# Patient Record
Sex: Male | Born: 1955 | Race: White | Hispanic: No | Smoking: Current every day smoker
Health system: Southern US, Community
[De-identification: ages and names within clinical notes are randomized; demographics above are authoritative.]

## PROBLEM LIST (undated history)

## (undated) ENCOUNTER — Emergency Department: Payer: Disability Insurance

## (undated) DIAGNOSIS — M545 Low back pain, unspecified: Secondary | ICD-10-CM

## (undated) DIAGNOSIS — L940 Localized scleroderma [morphea]: Secondary | ICD-10-CM

## (undated) DIAGNOSIS — F329 Major depressive disorder, single episode, unspecified: Secondary | ICD-10-CM

## (undated) DIAGNOSIS — F1910 Other psychoactive substance abuse, uncomplicated: Secondary | ICD-10-CM

## (undated) DIAGNOSIS — F32A Depression, unspecified: Secondary | ICD-10-CM

## (undated) DIAGNOSIS — B1920 Unspecified viral hepatitis C without hepatic coma: Secondary | ICD-10-CM

## (undated) DIAGNOSIS — F1010 Alcohol abuse, uncomplicated: Secondary | ICD-10-CM

## (undated) DIAGNOSIS — I2699 Other pulmonary embolism without acute cor pulmonale: Secondary | ICD-10-CM

## (undated) DIAGNOSIS — I7300 Raynaud's syndrome without gangrene: Secondary | ICD-10-CM

## (undated) DIAGNOSIS — E079 Disorder of thyroid, unspecified: Secondary | ICD-10-CM

## (undated) DIAGNOSIS — T7840XA Allergy, unspecified, initial encounter: Secondary | ICD-10-CM

## (undated) DIAGNOSIS — K743 Primary biliary cirrhosis: Secondary | ICD-10-CM

## (undated) DIAGNOSIS — I10 Essential (primary) hypertension: Secondary | ICD-10-CM

## (undated) HISTORY — PX: HERNIA REPAIR: SHX51

## (undated) HISTORY — DX: Other psychoactive substance abuse, uncomplicated: F19.10

## (undated) HISTORY — DX: Depression, unspecified: F32.A

## (undated) HISTORY — PX: KNEE SURGERY: SHX244

## (undated) HISTORY — DX: Other pulmonary embolism without acute cor pulmonale: I26.99

## (undated) HISTORY — DX: Low back pain, unspecified: M54.50

## (undated) HISTORY — DX: Alcohol abuse, uncomplicated: F10.10

## (undated) HISTORY — DX: Low back pain: M54.5

## (undated) HISTORY — DX: Major depressive disorder, single episode, unspecified: F32.9

## (undated) HISTORY — DX: Allergy, unspecified, initial encounter: T78.40XA

## (undated) HISTORY — DX: Unspecified viral hepatitis C without hepatic coma: B19.20

---

## 2002-03-12 DIAGNOSIS — G459 Transient cerebral ischemic attack, unspecified: Secondary | ICD-10-CM

## 2002-03-12 HISTORY — DX: Transient cerebral ischemic attack, unspecified: G45.9

## 2003-09-05 ENCOUNTER — Other Ambulatory Visit: Payer: Self-pay

## 2005-03-20 ENCOUNTER — Emergency Department: Payer: Self-pay | Admitting: Emergency Medicine

## 2005-06-02 ENCOUNTER — Emergency Department: Payer: Self-pay | Admitting: Unknown Physician Specialty

## 2007-01-29 ENCOUNTER — Other Ambulatory Visit: Payer: Self-pay

## 2007-01-29 ENCOUNTER — Inpatient Hospital Stay: Payer: Self-pay | Admitting: Internal Medicine

## 2007-02-17 ENCOUNTER — Emergency Department: Payer: Self-pay | Admitting: Emergency Medicine

## 2009-01-18 ENCOUNTER — Emergency Department: Payer: Self-pay | Admitting: Unknown Physician Specialty

## 2009-10-25 ENCOUNTER — Emergency Department: Payer: Self-pay | Admitting: Emergency Medicine

## 2009-11-30 ENCOUNTER — Emergency Department: Payer: Self-pay | Admitting: Unknown Physician Specialty

## 2010-08-11 ENCOUNTER — Inpatient Hospital Stay: Payer: Self-pay | Admitting: Psychiatry

## 2010-09-19 ENCOUNTER — Emergency Department: Payer: Self-pay | Admitting: Emergency Medicine

## 2010-09-21 ENCOUNTER — Ambulatory Visit: Payer: Self-pay | Admitting: Cardiology

## 2010-10-01 ENCOUNTER — Inpatient Hospital Stay: Payer: Self-pay | Admitting: Psychiatry

## 2010-10-14 ENCOUNTER — Emergency Department: Payer: Self-pay | Admitting: Emergency Medicine

## 2010-11-22 ENCOUNTER — Emergency Department: Payer: Self-pay | Admitting: Emergency Medicine

## 2011-03-09 ENCOUNTER — Emergency Department: Payer: Self-pay | Admitting: Emergency Medicine

## 2011-04-03 ENCOUNTER — Emergency Department: Payer: Self-pay | Admitting: Emergency Medicine

## 2011-04-05 ENCOUNTER — Emergency Department: Payer: Self-pay | Admitting: Emergency Medicine

## 2011-05-09 ENCOUNTER — Emergency Department: Payer: Self-pay | Admitting: Emergency Medicine

## 2011-08-05 ENCOUNTER — Emergency Department: Payer: Self-pay | Admitting: *Deleted

## 2011-09-27 ENCOUNTER — Observation Stay: Payer: Self-pay | Admitting: Internal Medicine

## 2011-09-27 LAB — CBC
HCT: 48.1 % (ref 40.0–52.0)
MCHC: 33.1 g/dL (ref 32.0–36.0)
MCV: 94 fL (ref 80–100)
Platelet: 139 10*3/uL — ABNORMAL LOW (ref 150–440)
RDW: 13.5 % (ref 11.5–14.5)
WBC: 3.5 10*3/uL — ABNORMAL LOW (ref 3.8–10.6)

## 2011-09-27 LAB — BASIC METABOLIC PANEL
Calcium, Total: 9.2 mg/dL (ref 8.5–10.1)
Chloride: 106 mmol/L (ref 98–107)
Creatinine: 1.12 mg/dL (ref 0.60–1.30)
EGFR (Non-African Amer.): >60
Glucose: 94 mg/dL (ref 65–99)
Potassium: 3.7 mmol/L (ref 3.5–5.1)
Sodium: 142 mmol/L (ref 136–145)

## 2011-09-27 LAB — TROPONIN I: Troponin-I: <0.02 ng/mL

## 2011-09-27 LAB — DRUG SCREEN, URINE
Amphetamines, Ur Screen: NEGATIVE (ref ?–1000)
Cannabinoid 50 Ng, Ur ~~LOC~~: POSITIVE (ref ?–50)
Cocaine Metabolite,Ur ~~LOC~~: POSITIVE (ref ?–300)
Opiate, Ur Screen: POSITIVE (ref ?–300)
Tricyclic, Ur Screen: NEGATIVE (ref ?–1000)

## 2011-09-27 LAB — PROTIME-INR: Prothrombin Time: 12.6 secs (ref 11.5–14.7)

## 2011-09-27 LAB — APTT: Activated PTT: 29.5 secs (ref 23.6–35.9)

## 2011-09-27 LAB — CK TOTAL AND CKMB (NOT AT ARMC)
CK, Total: 90 U/L (ref 35–232)
CK-MB: 1 ng/mL (ref 0.5–3.6)

## 2011-09-28 DIAGNOSIS — R079 Chest pain, unspecified: Secondary | ICD-10-CM

## 2011-09-28 LAB — TROPONIN I: Troponin-I: <0.02 ng/mL

## 2011-09-28 LAB — CBC WITH DIFFERENTIAL/PLATELET
Basophil %: 0.8 %
Eosinophil %: 6.1 %
Lymphocyte %: 49.2 %
Monocyte %: 11.2 %
Neutrophil #: 1.2 10*3/uL — ABNORMAL LOW (ref 1.4–6.5)
WBC: 3.7 10*3/uL — ABNORMAL LOW (ref 3.8–10.6)

## 2011-09-28 LAB — BASIC METABOLIC PANEL
Anion Gap: 5 — ABNORMAL LOW (ref 7–16)
BUN: 15 mg/dL (ref 7–18)
Calcium, Total: 7.7 mg/dL — ABNORMAL LOW (ref 8.5–10.1)
Co2: 27 mmol/L (ref 21–32)
Creatinine: 0.96 mg/dL (ref 0.60–1.30)
EGFR (African American): >60
Glucose: 101 mg/dL — ABNORMAL HIGH (ref 65–99)
Osmolality: 286 (ref 275–301)
Sodium: 143 mmol/L (ref 136–145)

## 2011-09-28 LAB — CK-MB: CK-MB: 0.5 ng/mL (ref 0.5–3.6)

## 2011-10-04 ENCOUNTER — Encounter: Payer: Self-pay | Admitting: Cardiovascular Disease

## 2011-10-04 ENCOUNTER — Encounter: Payer: Self-pay | Admitting: *Deleted

## 2011-10-12 ENCOUNTER — Encounter: Payer: Self-pay | Admitting: Cardiovascular Disease

## 2011-10-20 LAB — DRUG SCREEN, URINE
Amphetamines, Ur Screen: NEGATIVE (ref ?–1000)
Barbiturates, Ur Screen: NEGATIVE (ref ?–200)
Benzodiazepine, Ur Scrn: NEGATIVE (ref ?–200)
Methadone, Ur Screen: NEGATIVE (ref ?–300)
Tricyclic, Ur Screen: NEGATIVE (ref ?–1000)

## 2011-10-20 LAB — URINALYSIS, COMPLETE
Glucose,UR: NEGATIVE mg/dL (ref 0–75)
Hyaline Cast: 112
Ketone: NEGATIVE
Leukocyte Esterase: NEGATIVE
Ph: 5 (ref 4.5–8.0)
Protein: NEGATIVE
Specific Gravity: 1.026 (ref 1.003–1.030)

## 2011-10-20 LAB — COMPREHENSIVE METABOLIC PANEL
Albumin: 4 g/dL (ref 3.4–5.0)
Alkaline Phosphatase: 105 U/L (ref 50–136)
Bilirubin,Total: 0.7 mg/dL (ref 0.2–1.0)
Calcium, Total: 9.3 mg/dL (ref 8.5–10.1)
Co2: 26 mmol/L (ref 21–32)
Creatinine: 1.07 mg/dL (ref 0.60–1.30)
EGFR (Non-African Amer.): >60
Glucose: 123 mg/dL — ABNORMAL HIGH (ref 65–99)
SGOT(AST): 161 U/L — ABNORMAL HIGH (ref 15–37)
SGPT (ALT): 248 U/L — ABNORMAL HIGH (ref 12–78)
Sodium: 139 mmol/L (ref 136–145)

## 2011-10-20 LAB — TSH: Thyroid Stimulating Horm: 1.97 u[IU]/mL

## 2011-10-20 LAB — CBC
HCT: 48.3 % (ref 40.0–52.0)
HGB: 16.4 g/dL (ref 13.0–18.0)
MCV: 94 fL (ref 80–100)
Platelet: 139 10*3/uL — ABNORMAL LOW (ref 150–440)
RDW: 13.3 % (ref 11.5–14.5)
WBC: 4.5 10*3/uL (ref 3.8–10.6)

## 2011-10-20 LAB — ACETAMINOPHEN LEVEL: Acetaminophen: <2 ug/mL

## 2011-10-20 LAB — ETHANOL: Ethanol: <3 mg/dL

## 2011-10-21 ENCOUNTER — Inpatient Hospital Stay: Payer: Self-pay | Admitting: Psychiatry

## 2011-10-22 ENCOUNTER — Encounter: Payer: Self-pay | Admitting: Cardiovascular Disease

## 2011-10-25 ENCOUNTER — Encounter: Payer: Self-pay | Admitting: Cardiovascular Disease

## 2011-10-25 LAB — BEHAVIORAL MEDICINE 1 PANEL
Albumin: 3.4 g/dL (ref 3.4–5.0)
Alkaline Phosphatase: 75 U/L (ref 50–136)
Anion Gap: 9 (ref 7–16)
BUN: 16 mg/dL (ref 7–18)
Basophil #: 0 10*3/uL (ref 0.0–0.1)
Basophil %: 0.7 %
Calcium, Total: 8.8 mg/dL (ref 8.5–10.1)
Chloride: 103 mmol/L (ref 98–107)
Creatinine: 0.87 mg/dL (ref 0.60–1.30)
EGFR (Non-African Amer.): >60
Glucose: 92 mg/dL (ref 65–99)
HCT: 42.8 % (ref 40.0–52.0)
Lymphocyte %: 39.5 %
MCH: 31.4 pg (ref 26.0–34.0)
MCHC: 34 g/dL (ref 32.0–36.0)
Monocyte %: 11.6 %
Neutrophil #: 2.5 10*3/uL (ref 1.4–6.5)
Neutrophil %: 44.7 %
RBC: 4.64 10*6/uL (ref 4.40–5.90)
RDW: 13.4 % (ref 11.5–14.5)
SGOT(AST): 41 U/L — ABNORMAL HIGH (ref 15–37)
Sodium: 141 mmol/L (ref 136–145)
Thyroid Stimulating Horm: 1.47 u[IU]/mL
Total Protein: 7.5 g/dL (ref 6.4–8.2)
WBC: 5.6 10*3/uL (ref 3.8–10.6)

## 2012-01-01 ENCOUNTER — Emergency Department: Payer: Self-pay | Admitting: Emergency Medicine

## 2012-01-01 LAB — URINALYSIS, COMPLETE
Bilirubin,UR: NEGATIVE
Blood: NEGATIVE
Leukocyte Esterase: NEGATIVE
Nitrite: NEGATIVE
Ph: 9 (ref 4.5–8.0)
Protein: NEGATIVE
RBC,UR: NONE SEEN /HPF (ref 0–5)
Squamous Epithelial: NONE SEEN

## 2012-06-18 ENCOUNTER — Emergency Department: Payer: Self-pay | Admitting: Emergency Medicine

## 2012-06-18 LAB — DRUG SCREEN, URINE
Barbiturates, Ur Screen: NEGATIVE (ref ?–200)
Cocaine Metabolite,Ur ~~LOC~~: POSITIVE (ref ?–300)
MDMA (Ecstasy)Ur Screen: NEGATIVE (ref ?–500)
Methadone, Ur Screen: NEGATIVE (ref ?–300)
Phencyclidine (PCP) Ur S: NEGATIVE (ref ?–25)
Tricyclic, Ur Screen: NEGATIVE (ref ?–1000)

## 2012-06-18 LAB — URINALYSIS, COMPLETE
Bacteria: NONE SEEN
Blood: NEGATIVE
Protein: NEGATIVE
RBC,UR: NONE SEEN /HPF (ref 0–5)
Specific Gravity: 1.018 (ref 1.003–1.030)
WBC UR: NONE SEEN /HPF (ref 0–5)

## 2012-06-18 LAB — COMPREHENSIVE METABOLIC PANEL
Albumin: 3.7 g/dL (ref 3.4–5.0)
Alkaline Phosphatase: 87 U/L (ref 50–136)
Anion Gap: 4 — ABNORMAL LOW (ref 7–16)
Calcium, Total: 8.9 mg/dL (ref 8.5–10.1)
Chloride: 105 mmol/L (ref 98–107)
Co2: 29 mmol/L (ref 21–32)
EGFR (African American): >60
Glucose: 150 mg/dL — ABNORMAL HIGH (ref 65–99)
Osmolality: 279 (ref 275–301)
Sodium: 138 mmol/L (ref 136–145)
Total Protein: 8.2 g/dL (ref 6.4–8.2)

## 2012-06-18 LAB — TROPONIN I: Troponin-I: <0.02 ng/mL

## 2012-06-18 LAB — CBC
HCT: 43 % (ref 40.0–52.0)
HGB: 14.8 g/dL (ref 13.0–18.0)
MCHC: 34.4 g/dL (ref 32.0–36.0)
WBC: 5.8 10*3/uL (ref 3.8–10.6)

## 2012-06-18 LAB — ETHANOL: Ethanol: <3 mg/dL

## 2012-08-19 ENCOUNTER — Emergency Department: Payer: Self-pay | Admitting: Unknown Physician Specialty

## 2012-09-25 DIAGNOSIS — F32A Depression, unspecified: Secondary | ICD-10-CM | POA: Insufficient documentation

## 2012-09-25 DIAGNOSIS — J449 Chronic obstructive pulmonary disease, unspecified: Secondary | ICD-10-CM | POA: Insufficient documentation

## 2012-09-27 DIAGNOSIS — N410 Acute prostatitis: Secondary | ICD-10-CM | POA: Insufficient documentation

## 2012-09-27 DIAGNOSIS — F10939 Alcohol use, unspecified with withdrawal, unspecified: Secondary | ICD-10-CM | POA: Insufficient documentation

## 2012-09-28 DIAGNOSIS — F1410 Cocaine abuse, uncomplicated: Secondary | ICD-10-CM | POA: Insufficient documentation

## 2013-03-12 ENCOUNTER — Emergency Department: Payer: Self-pay | Admitting: Emergency Medicine

## 2013-03-13 ENCOUNTER — Emergency Department: Payer: Self-pay | Admitting: Emergency Medicine

## 2013-03-13 LAB — BASIC METABOLIC PANEL
ANION GAP: 4 — AB (ref 7–16)
BUN: 11 mg/dL (ref 7–18)
CALCIUM: 9.5 mg/dL (ref 8.5–10.1)
CHLORIDE: 103 mmol/L (ref 98–107)
CO2: 29 mmol/L (ref 21–32)
CREATININE: 0.93 mg/dL (ref 0.60–1.30)
EGFR (African American): >60
Glucose: 156 mg/dL — ABNORMAL HIGH (ref 65–99)
Osmolality: 275 (ref 275–301)
POTASSIUM: 3.9 mmol/L (ref 3.5–5.1)
SODIUM: 136 mmol/L (ref 136–145)

## 2013-03-13 LAB — CBC
HCT: 41.8 % (ref 40.0–52.0)
HGB: 14.3 g/dL (ref 13.0–18.0)
MCH: 31.7 pg (ref 26.0–34.0)
MCHC: 34.3 g/dL (ref 32.0–36.0)
MCV: 93 fL (ref 80–100)
Platelet: 124 10*3/uL — ABNORMAL LOW (ref 150–440)
RBC: 4.52 10*6/uL (ref 4.40–5.90)
RDW: 14.3 % (ref 11.5–14.5)
WBC: 3.8 10*3/uL (ref 3.8–10.6)

## 2013-04-21 LAB — COMPREHENSIVE METABOLIC PANEL
AST: 181 U/L — AB (ref 15–37)
Albumin: 3.8 g/dL (ref 3.4–5.0)
Alkaline Phosphatase: 77 U/L
Anion Gap: 2 — ABNORMAL LOW (ref 7–16)
BUN: 10 mg/dL (ref 7–18)
Bilirubin,Total: 0.4 mg/dL (ref 0.2–1.0)
CHLORIDE: 106 mmol/L (ref 98–107)
Calcium, Total: 8.9 mg/dL (ref 8.5–10.1)
Co2: 29 mmol/L (ref 21–32)
Creatinine: 0.88 mg/dL (ref 0.60–1.30)
EGFR (African American): >60
EGFR (Non-African Amer.): >60
GLUCOSE: 93 mg/dL (ref 65–99)
Osmolality: 273 (ref 275–301)
Potassium: 3.9 mmol/L (ref 3.5–5.1)
SGPT (ALT): 344 U/L — ABNORMAL HIGH (ref 12–78)
Sodium: 137 mmol/L (ref 136–145)
Total Protein: 8.1 g/dL (ref 6.4–8.2)

## 2013-04-21 LAB — URINALYSIS, COMPLETE
BACTERIA: NONE SEEN
BILIRUBIN, UR: NEGATIVE
Blood: NEGATIVE
Glucose,UR: NEGATIVE mg/dL (ref 0–75)
KETONE: NEGATIVE
LEUKOCYTE ESTERASE: NEGATIVE
NITRITE: NEGATIVE
PH: 5 (ref 4.5–8.0)
Protein: NEGATIVE
RBC,UR: 1 /HPF (ref 0–5)
SPECIFIC GRAVITY: 1.015 (ref 1.003–1.030)
SQUAMOUS EPITHELIAL: NONE SEEN

## 2013-04-21 LAB — CBC
HCT: 44.6 % (ref 40.0–52.0)
HGB: 15.1 g/dL (ref 13.0–18.0)
MCH: 32.1 pg (ref 26.0–34.0)
MCHC: 33.8 g/dL (ref 32.0–36.0)
MCV: 95 fL (ref 80–100)
Platelet: 85 10*3/uL — ABNORMAL LOW (ref 150–440)
RBC: 4.71 10*6/uL (ref 4.40–5.90)
RDW: 13.6 % (ref 11.5–14.5)
WBC: 4.6 10*3/uL (ref 3.8–10.6)

## 2013-04-21 LAB — ETHANOL
Ethanol %: <0.003 % (ref 0.000–0.080)
Ethanol: <3 mg/dL

## 2013-04-21 LAB — DRUG SCREEN, URINE
AMPHETAMINES, UR SCREEN: NEGATIVE (ref ?–1000)
Barbiturates, Ur Screen: NEGATIVE (ref ?–200)
Benzodiazepine, Ur Scrn: POSITIVE (ref ?–200)
COCAINE METABOLITE, UR ~~LOC~~: POSITIVE (ref ?–300)
Cannabinoid 50 Ng, Ur ~~LOC~~: NEGATIVE (ref ?–50)
MDMA (Ecstasy)Ur Screen: NEGATIVE (ref ?–500)
Methadone, Ur Screen: NEGATIVE (ref ?–300)
OPIATE, UR SCREEN: NEGATIVE (ref ?–300)
Phencyclidine (PCP) Ur S: NEGATIVE (ref ?–25)
Tricyclic, Ur Screen: NEGATIVE (ref ?–1000)

## 2013-04-21 LAB — SALICYLATE LEVEL: Salicylates, Serum: <1.7 mg/dL

## 2013-04-21 LAB — ACETAMINOPHEN LEVEL: Acetaminophen: <2 ug/mL

## 2013-04-22 ENCOUNTER — Inpatient Hospital Stay: Payer: Self-pay | Admitting: Psychiatry

## 2013-04-25 LAB — HEPATIC FUNCTION PANEL A (ARMC)
AST: 124 U/L — AB (ref 15–37)
Albumin: 3.1 g/dL — ABNORMAL LOW (ref 3.4–5.0)
Alkaline Phosphatase: 60 U/L
Bilirubin, Direct: 0.1 mg/dL (ref 0.00–0.20)
Bilirubin,Total: 0.2 mg/dL (ref 0.2–1.0)
SGPT (ALT): 234 U/L — ABNORMAL HIGH (ref 12–78)
Total Protein: 7 g/dL (ref 6.4–8.2)

## 2013-04-26 LAB — AMMONIA: AMMONIA, PLASMA: 35 umol/L — AB (ref 11–32)

## 2013-04-26 LAB — MAGNESIUM: Magnesium: 2.1 mg/dL

## 2013-05-12 ENCOUNTER — Emergency Department: Payer: Self-pay | Admitting: Emergency Medicine

## 2013-05-12 LAB — CBC
HCT: 37.4 % — ABNORMAL LOW (ref 40.0–52.0)
HGB: 12.3 g/dL — ABNORMAL LOW (ref 13.0–18.0)
MCH: 30.9 pg (ref 26.0–34.0)
MCHC: 32.8 g/dL (ref 32.0–36.0)
MCV: 94 fL (ref 80–100)
PLATELETS: 152 10*3/uL (ref 150–440)
RBC: 3.96 10*6/uL — ABNORMAL LOW (ref 4.40–5.90)
RDW: 13.3 % (ref 11.5–14.5)
WBC: 3.4 10*3/uL — ABNORMAL LOW (ref 3.8–10.6)

## 2013-05-12 LAB — COMPREHENSIVE METABOLIC PANEL
ALBUMIN: 3 g/dL — AB (ref 3.4–5.0)
ALT: 132 U/L — AB (ref 12–78)
AST: 129 U/L — AB (ref 15–37)
Alkaline Phosphatase: 55 U/L
Anion Gap: 2 — ABNORMAL LOW (ref 7–16)
BILIRUBIN TOTAL: 0.3 mg/dL (ref 0.2–1.0)
BUN: 14 mg/dL (ref 7–18)
CALCIUM: 8.4 mg/dL — AB (ref 8.5–10.1)
Chloride: 108 mmol/L — ABNORMAL HIGH (ref 98–107)
Co2: 31 mmol/L (ref 21–32)
Creatinine: 0.87 mg/dL (ref 0.60–1.30)
EGFR (African American): >60
EGFR (Non-African Amer.): >60
GLUCOSE: 84 mg/dL (ref 65–99)
OSMOLALITY: 281 (ref 275–301)
POTASSIUM: 4.2 mmol/L (ref 3.5–5.1)
SODIUM: 141 mmol/L (ref 136–145)
TOTAL PROTEIN: 7.3 g/dL (ref 6.4–8.2)

## 2013-08-05 IMAGING — CR DG SHOULDER 3+V*R*
1 series · 3 of 3 positions shown · non-contrast
Comparison: none

REASON FOR EXAM: pain/decrease ROM 2nd to lifting incident
COMMENTS:   LMP: (Male)

[Series 1: w shoulder external right · 0.14mm/px · 3 of 3 slices shown]
[im 1/3]
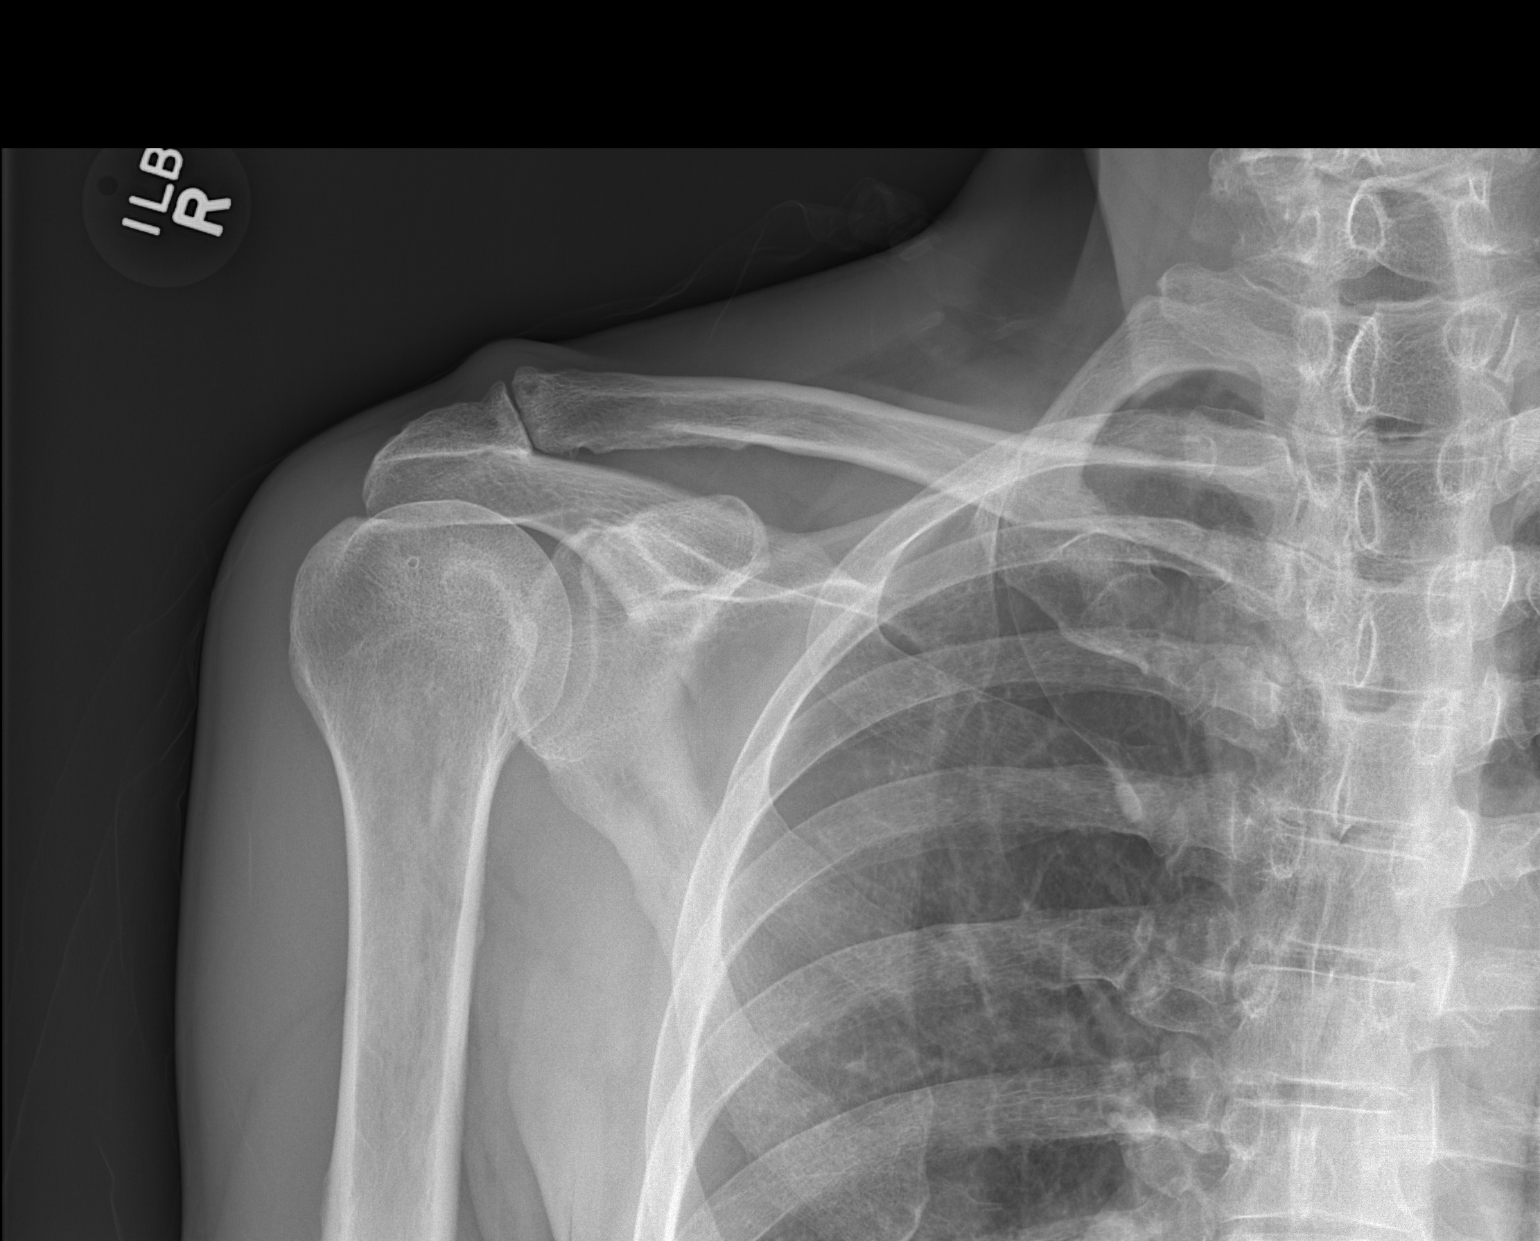
[im 2/3]
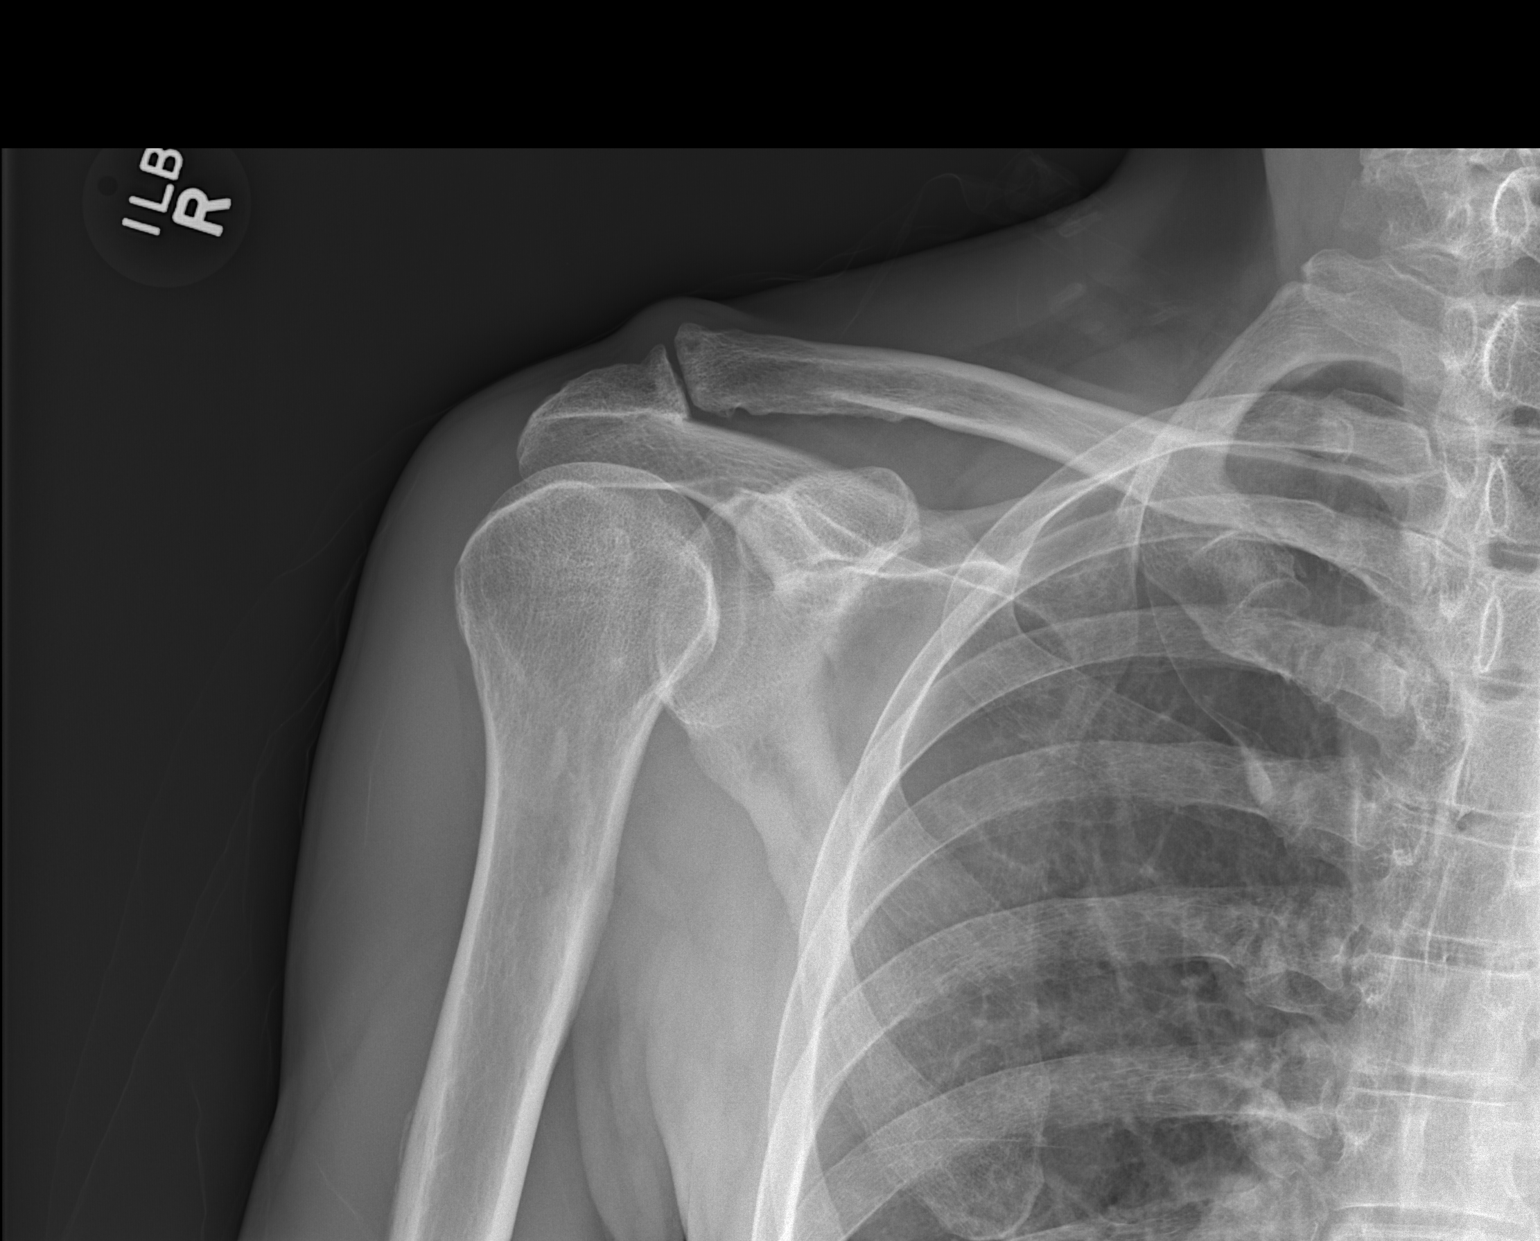
[im 3/3]
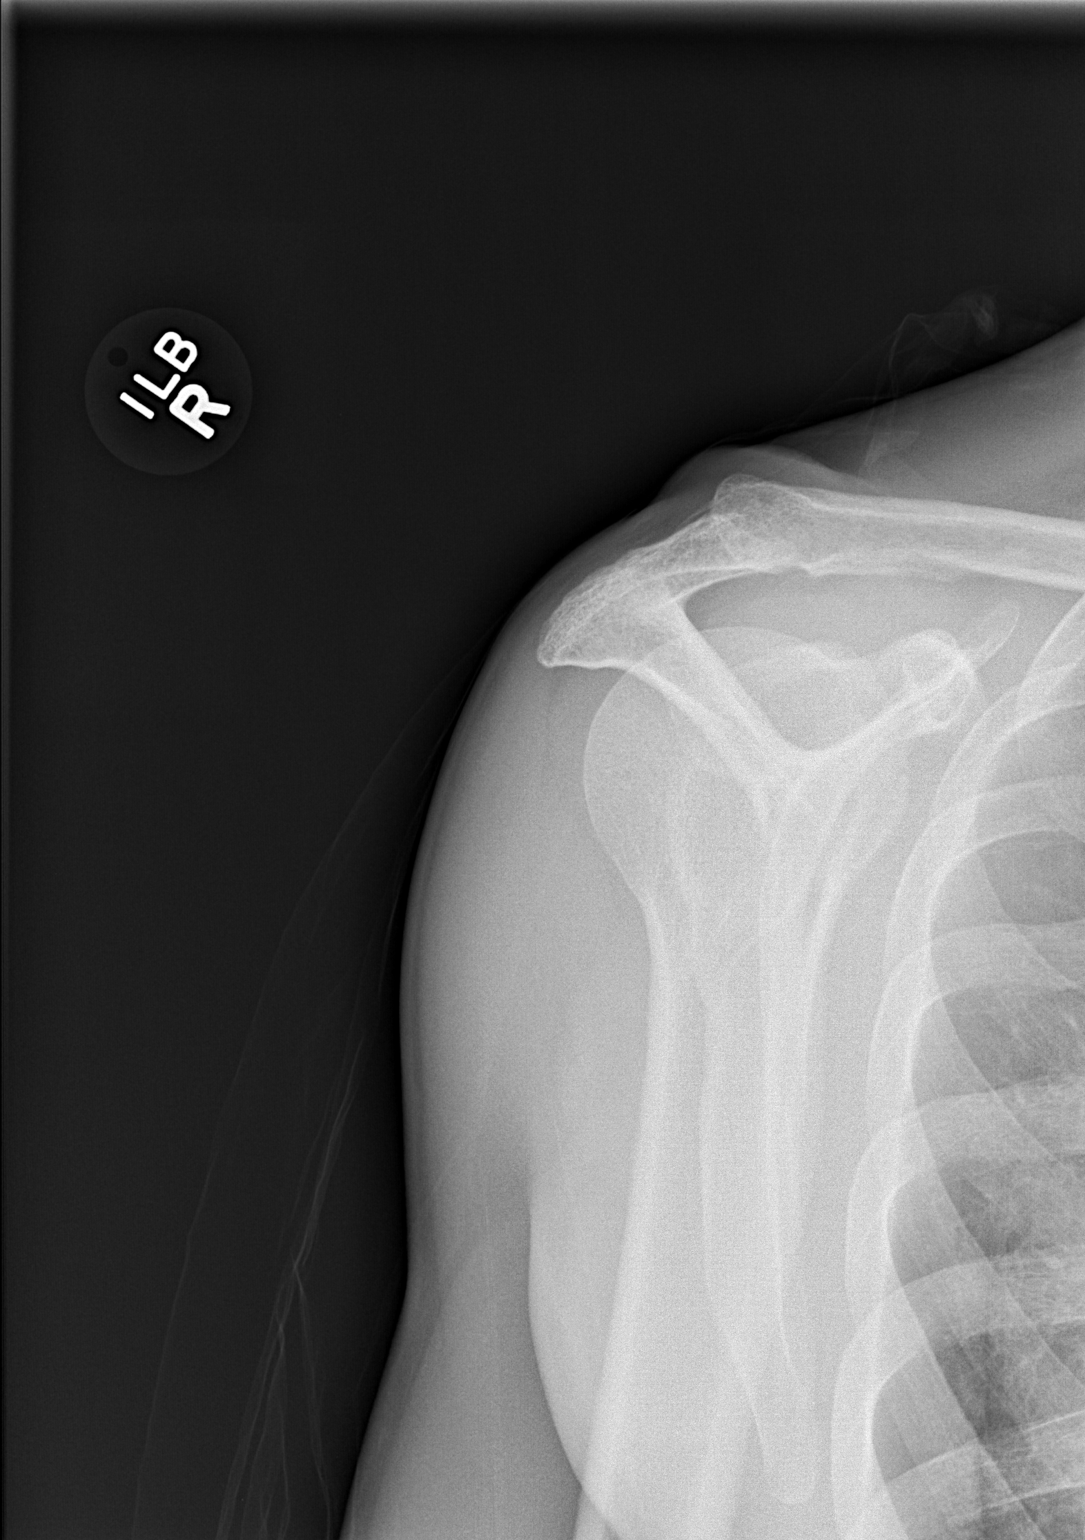

[3 of 3 positions shown; findings below may reference images not displayed]

PROCEDURE:     DXR - DXR SHOULDER RIGHT COMPLETE  - August 19, 2012 [DATE]

RESULT:     Three views of the right shoulder reveal the bones to be
reasonably well mineralized. The glenohumeral joint is normal in appearance.
There are degenerative changes of the AC joint. The observed portions of the
clavicle and upper right ribs appear normal.
IMPRESSION: There is no acute bony abnormality of the right shoulder.
There are degenerative changes of the AC joint. If the patient's symptoms
persist, followup MRI may be useful.

[REDACTED]

## 2013-09-13 ENCOUNTER — Emergency Department: Payer: Self-pay | Admitting: Emergency Medicine

## 2013-09-13 LAB — CBC
HCT: 43 % (ref 40.0–52.0)
HGB: 14.2 g/dL (ref 13.0–18.0)
MCH: 30.9 pg (ref 26.0–34.0)
MCHC: 33 g/dL (ref 32.0–36.0)
MCV: 94 fL (ref 80–100)
Platelet: 124 10*3/uL — ABNORMAL LOW (ref 150–440)
RBC: 4.6 10*6/uL (ref 4.40–5.90)
RDW: 13.9 % (ref 11.5–14.5)
WBC: 5.3 10*3/uL (ref 3.8–10.6)

## 2013-09-13 LAB — URINALYSIS, COMPLETE
BLOOD: NEGATIVE
Bacteria: NEGATIVE
Bilirubin,UR: NEGATIVE
GLUCOSE, UR: NEGATIVE mg/dL (ref 0–75)
Ketone: NEGATIVE
LEUKOCYTE ESTERASE: NEGATIVE
NITRITE: NEGATIVE
Ph: 5 (ref 4.5–8.0)
Protein: NEGATIVE
RBC, UR: NONE SEEN /HPF (ref 0–5)
Specific Gravity: 1.01 (ref 1.003–1.030)

## 2013-09-13 LAB — SALICYLATE LEVEL: SALICYLATES, SERUM: 2.6 mg/dL

## 2013-09-13 LAB — DRUG SCREEN, URINE
AMPHETAMINES, UR SCREEN: NEGATIVE (ref ?–1000)
BARBITURATES, UR SCREEN: NEGATIVE (ref ?–200)
BENZODIAZEPINE, UR SCRN: NEGATIVE (ref ?–200)
CANNABINOID 50 NG, UR ~~LOC~~: NEGATIVE (ref ?–50)
Cocaine Metabolite,Ur ~~LOC~~: POSITIVE (ref ?–300)
MDMA (Ecstasy)Ur Screen: NEGATIVE (ref ?–500)
Methadone, Ur Screen: NEGATIVE (ref ?–300)
Opiate, Ur Screen: NEGATIVE (ref ?–300)
PHENCYCLIDINE (PCP) UR S: NEGATIVE (ref ?–25)
Tricyclic, Ur Screen: NEGATIVE (ref ?–1000)

## 2013-09-13 LAB — COMPREHENSIVE METABOLIC PANEL
ALK PHOS: 83 U/L
Albumin: 3.4 g/dL (ref 3.4–5.0)
Anion Gap: 7 (ref 7–16)
BILIRUBIN TOTAL: 0.4 mg/dL (ref 0.2–1.0)
BUN: 9 mg/dL (ref 7–18)
Calcium, Total: 8.7 mg/dL (ref 8.5–10.1)
Chloride: 105 mmol/L (ref 98–107)
Co2: 27 mmol/L (ref 21–32)
Creatinine: 0.9 mg/dL (ref 0.60–1.30)
EGFR (Non-African Amer.): >60
Glucose: 93 mg/dL (ref 65–99)
Osmolality: 276 (ref 275–301)
Potassium: 3.8 mmol/L (ref 3.5–5.1)
SGOT(AST): 216 U/L — ABNORMAL HIGH (ref 15–37)
SGPT (ALT): 250 U/L — ABNORMAL HIGH (ref 12–78)
SODIUM: 139 mmol/L (ref 136–145)
Total Protein: 8.4 g/dL — ABNORMAL HIGH (ref 6.4–8.2)

## 2013-09-13 LAB — ACETAMINOPHEN LEVEL

## 2013-09-13 LAB — ETHANOL
ETHANOL %: 0.168 % — AB (ref 0.000–0.080)
Ethanol: 168 mg/dL

## 2013-09-13 LAB — TROPONIN I: Troponin-I: <0.02 ng/mL

## 2013-09-13 LAB — CK: CK, Total: 203 U/L

## 2013-09-14 LAB — TROPONIN I: Troponin-I: <0.02 ng/mL

## 2013-10-23 ENCOUNTER — Emergency Department: Payer: Self-pay | Admitting: Emergency Medicine

## 2013-12-14 ENCOUNTER — Ambulatory Visit: Payer: Self-pay | Admitting: Nurse Practitioner

## 2013-12-31 ENCOUNTER — Emergency Department: Payer: Self-pay | Admitting: Emergency Medicine

## 2014-02-18 ENCOUNTER — Ambulatory Visit: Payer: Self-pay | Admitting: Nurse Practitioner

## 2014-02-26 ENCOUNTER — Ambulatory Visit: Payer: Self-pay | Admitting: Specialist

## 2014-04-04 ENCOUNTER — Emergency Department: Payer: Self-pay | Admitting: Student

## 2014-04-04 LAB — COMPREHENSIVE METABOLIC PANEL
ALBUMIN: 4 g/dL (ref 3.4–5.0)
ALT: 322 U/L — AB
AST: 228 U/L — AB (ref 15–37)
Alkaline Phosphatase: 79 U/L
Anion Gap: 9 (ref 7–16)
BUN: 14 mg/dL (ref 7–18)
Bilirubin,Total: 0.4 mg/dL (ref 0.2–1.0)
Calcium, Total: 8.7 mg/dL (ref 8.5–10.1)
Chloride: 105 mmol/L (ref 98–107)
Co2: 24 mmol/L (ref 21–32)
Creatinine: 0.97 mg/dL (ref 0.60–1.30)
EGFR (African American): >60
EGFR (Non-African Amer.): >60
GLUCOSE: 109 mg/dL — AB (ref 65–99)
Osmolality: 277 (ref 275–301)
POTASSIUM: 3.8 mmol/L (ref 3.5–5.1)
Sodium: 138 mmol/L (ref 136–145)
TOTAL PROTEIN: 8.1 g/dL (ref 6.4–8.2)

## 2014-04-04 LAB — DRUG SCREEN, URINE
Amphetamines, Ur Screen: NEGATIVE (ref ?–1000)
Barbiturates, Ur Screen: NEGATIVE (ref ?–200)
Benzodiazepine, Ur Scrn: NEGATIVE (ref ?–200)
COCAINE METABOLITE, UR ~~LOC~~: POSITIVE (ref ?–300)
Cannabinoid 50 Ng, Ur ~~LOC~~: NEGATIVE (ref ?–50)
MDMA (ECSTASY) UR SCREEN: NEGATIVE (ref ?–500)
Methadone, Ur Screen: NEGATIVE (ref ?–300)
Opiate, Ur Screen: NEGATIVE (ref ?–300)
PHENCYCLIDINE (PCP) UR S: NEGATIVE (ref ?–25)
Tricyclic, Ur Screen: NEGATIVE (ref ?–1000)

## 2014-04-04 LAB — CBC
HCT: 41.9 % (ref 40.0–52.0)
HGB: 13.7 g/dL (ref 13.0–18.0)
MCH: 30.5 pg (ref 26.0–34.0)
MCHC: 32.6 g/dL (ref 32.0–36.0)
MCV: 94 fL (ref 80–100)
PLATELETS: 119 10*3/uL — AB (ref 150–440)
RBC: 4.48 10*6/uL (ref 4.40–5.90)
RDW: 15 % — ABNORMAL HIGH (ref 11.5–14.5)
WBC: 5.3 10*3/uL (ref 3.8–10.6)

## 2014-04-04 LAB — URINALYSIS, COMPLETE
BACTERIA: NONE SEEN
Bilirubin,UR: NEGATIVE
Blood: NEGATIVE
GLUCOSE, UR: NEGATIVE mg/dL (ref 0–75)
KETONE: NEGATIVE
LEUKOCYTE ESTERASE: NEGATIVE
Nitrite: NEGATIVE
Ph: 5 (ref 4.5–8.0)
Protein: NEGATIVE
RBC, UR: NONE SEEN /HPF (ref 0–5)
SQUAMOUS EPITHELIAL: NONE SEEN
Specific Gravity: 1.003 (ref 1.003–1.030)
WBC UR: NONE SEEN /HPF (ref 0–5)

## 2014-04-04 LAB — SALICYLATE LEVEL: SALICYLATES, SERUM: 4.8 mg/dL — AB

## 2014-04-04 LAB — ETHANOL: Ethanol: 212 mg/dL

## 2014-04-04 LAB — ACETAMINOPHEN LEVEL: Acetaminophen: <2 ug/mL

## 2014-04-05 LAB — ETHANOL: Ethanol: <3 mg/dL
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
## 2014-06-29 NOTE — H&P (Signed)
PATIENT NAME:  Juan Wood, Juan Wood MR#:  [id number] DATE OF BIRTH:  1956/03/01  DATE OF ADMISSION:  09/27/2011  PRIMARY CARE PHYSICIAN: None. REQUESTING PHYSICIAN: Daryel NovemberJonathan Williams, MD   CHIEF COMPLAINT: Chest pain.   HISTORY OF PRESENT ILLNESS: The patient is a 59 year old male with a known history of alcohol and drug abuse, depression, hepatitis C, is being admitted for chest pain. The patient was working out in the hot day yesterday when he started having chest pain. He was working outside and started having associated shortness of breath, was feeling very weak, nauseated, and  decided to come to the Emergency Department. His pain continued all night, was radiating to his left side of the arm and center of the chest. Considering his positive family history of heart disease, he was concerned. He also has an extensive smoking history, and he is being admitted for further evaluation and management.   PAST MEDICAL HISTORY:  1. Depression.  2. Hepatitis C.  3. Alcohol and drug abuse.   ALLERGIES: No known drug allergies.   SOCIAL HISTORY: He smokes 1 pack of cigarettes daily for the last 35 years. Occasional alcohol. Denies any illicit drugs. The patient has also had a history of legal problems and lack of primary support along with lack of access to health care resources due to lack of insurance.   FAMILY HISTORY: Positive for coronary artery disease. Father had a heart attack at the age of 59. Father also had a stroke at the age of 59.   MEDICATIONS AT HOME: None.   REVIEW OF SYSTEMS: CONSTITUTIONAL: No fever. Positive fatigue and weakness. EYES: No blurred or double vision. ENT: No tinnitus or ear pain. RESPIRATORY: No cough, wheezing or hemoptysis. CARDIOVASCULAR: Positive for chest pain, no orthopnea or edema.  GASTROINTESTINAL: No nausea, vomiting, diarrhea. GENITOURINARY: No dysuria or hematuria. ENDOCRINE: No polyuria or nocturia.  HEMATOLOGIC: No anemia or easy bruising. SKIN: No rash or  lesion. MUSCULOSKELETAL: No arthritis or muscle cramp. NEUROLOGICAL: No tingling, numbness. Positive for weakness and dizziness. SKIN: No rash or lesion, or ulcer. PSYCHIATRIC: Positive for major depression and polysubstance abuse.  Severe occupational problems along with housing problem and lack of compliance with substance abuse treatment.    PAST SURGICAL HISTORY:  1. Hernia repair.  2. Right knee surgery.   PHYSICAL EXAMINATION:  VITAL SIGNS: Temperature 98, heart rate 58 per minute, earlier it was 48 per minute, respirations 18 per minute, blood pressure 117/71 mmHg. He is saturating 99% on room air.   GENERAL: The patient is a 59 year old male lying in the bed comfortably without any acute distress.   HEENT: Eyes: Pupils are equal, round, reactive to light and accommodation. No scleral icterus. Extraocular muscles are intact. HENT: Head atraumatic, normocephalic. Oropharynx and nasopharynx clear.   NECK: Supple. No jugular venous distention. No thyroid enlargement or tenderness.   LUNGS: Clear to auscultation bilaterally. No wheezing, rales, rhonchi or crepitation.   CARDIOVASCULAR: S1 and S2 normal. No murmurs, rubs or gallop.   LUNGS: Clear to auscultation bilaterally. No wheezing, rales, rhonchi, or crepitation.   ABDOMEN: Soft, nondistended, nontender. Bowel sounds are present. No organomegaly or masses.   EXTREMITIES: No pedal edema, cyanosis or clubbing.   NEUROLOGIC: Nonfocal examination. Cranial nerves II through XII are intact. Muscle strength 5/5. Extremity sensation intact.   PSYCHIATRIC: The patient is alert and oriented to time, place and person x3.    SKIN: No obvious rash, lesion, or ulcer.   LABORATORY, DIAGNOSTIC AND RADIOLOGICAL DATA: Normal  BMP. Normal first set of cardiac enzymes. Normal CBC except platelet count of 139. Normal coagulation panel. Chest x-ray in the Emergency Room showed no acute cardiopulmonary disease. He did have findings consistent with  chronic obstructive pulmonary disease. EKG was unremarkable. No acute ST-T changes.   IMPRESSION AND PLAN:  1. Chest pain, likely noncardiac: It is atypical in nature. We will obtain serial cardiac enzymes, obtain Myoview in the morning; and if negative, the patient should be able to go home tomorrow. We will hold metoprolol or any beta blocker due to significant bradycardia and monitor him on off-unit telemetry.  2. Depression: He was followed by Dr. Maryruth Bun in the past. He does not have any insurance. We will hold off consulting Psychiatry. His mood seems stable.  3. Sinus Bradycardia: We will monitor his heart rate while on off-unit telemetry and hold off any beta blocker or rate-controlling medication.  4. Thrombocytopenia, likely chronic from ongoing alcohol abuse: He has had a platelet count as low as 121 back in June of 2012. We will closely monitor this. This is due to alcoholic liver disease.  5. Tobacco abuse: The patient was counseled about three minutes. He denies any need of nicotine patch while in the hospital.   TIME TAKEN: Total time taking care of this patient is 45 minutes.    ____________________________ Ellamae Sia. Sherryll Burger, MD vss:cbb D: 09/27/2011 14:58:10 ET T: 09/27/2011 15:23:09 ET JOB#: [id number]  cc: Grenda Lora S. Sherryll Burger, MD, <Dictator> [facility] Ellamae Sia [facility] MD ELECTRONICALLY SIGNED 09/27/2011 22:48

## 2014-06-29 NOTE — Discharge Summary (Signed)
PATIENT NAME:  Juan Wood, Juan Wood MR#:  [id number] DATE OF BIRTH:  06/29/1955  DATE OF ADMISSION:  09/27/2011 DATE OF DISCHARGE:  09/28/2011  DISCHARGE DIAGNOSES:  1. Chest pain, atypical in nature, with negative serial cardiac enzymes and negative Myoview, could be due to cocaine. 2. Polysubstance abuse with urine drug screen positive for cocaine.  3. Sinus bradycardia, asymptomatic. No further work-up as per cardiology.   SECONDARY DIAGNOSES:  1. Depression.  2. Hepatitis C. 3. Alcohol and drug abuse.   CONSULTATIONS: None.   PROCEDURE/RADIOLOGY: Myoview on 07/19 was negative. Ejection fraction of 58%. Chest x-ray on 07/18 showed findings consistent with chronic obstructive pulmonary disease. No acute cardiopulmonary disease.   HISTORY AND SHORT HOSPITAL COURSE: The patient is a 59 year old male with above-mentioned medical problems who was admitted for chest pain. Please see Dr. Margaretmary EddyShah's dictated history and physical for further details. He was ruled out with three negative sets of cardiac enzymes. Urine drug screen was positive for cocaine, cannabinoids and opiates. He underwent Myoview which was negative and was discharged home in stable condition. His chest pain was thought to be noncardiac and/or possibly due to cocaine.   PERTINENT PHYSICAL EXAMINATION ON THE DATE OF DISCHARGE: VITAL SIGNS: On the date of discharge, his vital signs are as follows: Temperature 98.4, heart rate 59 per minute, respirations 16 per minute, blood pressure 120/67. He was saturating 97% on room air. CARDIOVASCULAR: S1, S2 normal. No murmurs, rubs, or gallop. LUNGS: Clear to auscultation bilaterally. No wheezing, rales, rhonchi, or crepitation. ABDOMEN: Soft and benign. NEUROLOGIC: Nonfocal examination. All other physical examination remained at the baseline.   DISCHARGE MEDICATIONS: None.   DISCHARGE DIET: Low sodium, low fat, low cholesterol.   DISCHARGE ACTIVITY: As tolerated.   DISCHARGE INSTRUCTIONS AND  FOLLOW-UP:  1. The patient was instructed to follow-up with new primary care physician at [facility] in 1 to 2 weeks.  2. He will need follow-up with Dr. Mariah MillingGollan from [facility] in 2 to 4 weeks.  3. He was instructed to stop using drugs of abuse.   TOTAL TIME DISCHARGING THIS PATIENT: 45 minutes.   ____________________________ Ellamae SiaVipul S. Sherryll BurgerShah, MD vss:ap D: 10/01/2011 00:07:59 ET T: 10/01/2011 14:31:56 ET JOB#: [id number]  cc: Zalma Channing S. Sherryll BurgerShah, MD, <Dictator> [facility] Antonieta Ibaimothy J. Gollan, MD Ellamae SiaVIPUL S [facility] MD ELECTRONICALLY SIGNED 10/01/2011 22:01
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
## 2014-07-03 NOTE — Consult Note (Signed)
PATIENT NAME:  Juan Wood, Cline F MR#:  [id number] DATE OF BIRTH:  Oct 16, 1955  DATE OF CONSULTATION:  04/21/2013  CONSULTING PHYSICIAN:  Mackayla Mullins S. Garnetta BuddyFaheem, MD  REQUESTED BY:  Dr. Governor Rooksebecca Lord  REASON FOR CONSULT:  "I am here for depression, and alcohol and drug use. I need detox. I feel so depressed that I do not care if a car runs over me."   HISTORY OF PRESENT ILLNESS:  The patient is a 59 year old married male  who presented to the ED stating that he is feeling very depressed. He stated that he wants to hurt himself and has been feeling depressed. He just came out of jail after 20 days, and his wife asked him why he was incarcerated. He stated that he cannot take it anymore. He reported that his mother was in the hospital, and she just came out recently. The patient reported that after his release from the jail, he started drinking beer and was consuming 6 to 12 cans of beer on a daily basis. He stated that he also started using cocaine on a daily basis. 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 Reported that he is currently having relationship problems with his wife, as she left him, and now he does not know where she is. The patient reported that he was having suicidal ideations, and he was thinking about his wife all night long. The patient stated that he continues to have suicidal thoughts, and he is unable to contract for safety at this time. He was incarcerated for simple possession of marijuana, and he spent 20 days in prison. The patient reported that he does not want to know about the other details of the problems he is currently having. He stated that he feels very depressed, hopeless, helpless, and has been having thoughts to hurt himself, and is unable to contract for safety at this time.   PSYCHIATRIC HISTORY: The patient has history of depression in the past. He stated that he was taking medications, including Prozac and Klonopin, but he is not getting  any of them at this time. He continued to have suicidal thoughts.   SUBSTANCE ABUSE HISTORY: The patient reported that he started drinking at the age of 59. His last drink was 2 days ago, when he consumed approximately a 12-pack. He stated that he does not have any history of seizures or blackouts. He reported that he went to the rehab program at least 3 times, twice at [facility] and [facility]. He was unable to maintain any period of sobriety.   PAST MEDICAL HISTORY: The patient reported history of hypertension, chronic back pain, emphysema, hepatitis C, right knee surgery, hernia repair.   ALLERGIES: No known drug allergies.   CURRENT MEDICATIONS: None.   SOCIAL HISTORY: The patient is currently separated from his wife. He stated that he does not know the whereabouts of his wife at this time.   ANCILLARY DATA:  Temperature 97.8, pulse 68, respirations 18, blood pressure 137/78.   LABORATORY DATA: Glucose 93, BUN 10, creatinine 0.88, sodium 137, potassium 3.9, chloride 106, bicarbonate 29, anion gap 2, osmolality 273, calcium 8.9. Blood alcohol level less than 3. Protein 8.1, albumin 3.8, bilirubin 0.4, alkaline phosphatase 77,  AST 181, ALT 344. UDS positive for benzodiazepines and cocaine. WBC 4.6, RBC 4.71, MCV 95, RDW 13.6.   REVIEW OF SYSTEMS:    CONSTITUTIONAL: The patient has lost a significant amount of weight.  EYES: No  double or blurred vision.  CHEST:  No dyspnea or orthopnea.  GASTROINTESTINAL: No abdominal pain, nausea, vomiting or diarrhea. GENITOURINARY;  No incontinence or hematuria.   INTEGUMENTARY: No rash. Other symptoms are negative.   MENTAL STATUS EXAMINATION: The patient is a thinly built male who was lying in the bed. He maintained fair eye contact. His speech was low in tone and volume. Mood was depressed and anxious. Affect was congruent. Thought process was logical, goal-directed. Thought content was non-delusional. He appeared depressed and dysphoric. He  continued to express suicidal ideation with a plan to hurt himself. He denied having any homicidal ideations. At this time his language was appropriate. Fund of knowledge was fine. Memory appeared intact at this time. He denied having any perceptual disturbances. Unable to contract for safety at this time.   DIAGNOSTIC IMPRESSION: AXIS I: 1.  Alcohol dependence.  2.  Major depressive disorder, recurrent, severe, without psychotic features.  3.  Cocaine abuse.  AXIS II: None.  AXIS III: Please review the medical history.   TREATMENT PLAN: 1.  I will initiate involuntary commitment on this patient as he is unable to contract for safety at this time.  2.  I will start him on Librium 25 mg p.o. q.6 hours to help with the withdrawal symptoms from alcohol. He will also continue on thiamine 100 mg p.o. daily. The patient will be admitted to the inpatient behavioral health unit once a bed becomes available. He was advised about [facility] but he is not interested in going over there at this time.   Thank you for allowing me to participate in the care of this patient.    ____________________________ Ardeen Fillers. Garnetta Buddy, MD usf:mr D: 04/21/2013 19:41:48 ET T: 04/21/2013 19:57:06 ET JOB#: [id number]  cc: Ardeen Fillers. Garnetta Buddy, MD, <Dictator> Rhunette Croft MD ELECTRONICALLY SIGNED 04/27/2013 9:10

## 2014-07-03 NOTE — Consult Note (Signed)
Brief Consult Note: Diagnosis: Alcohol dependence, cocaine dependence, mood disorder NOS.   Patient was seen by consultant.   Consult note dictated.   Recommend further assessment or treatment.   Orders entered.   Comments: Juan Wood has a h/o alcoholism and mood instability worse since his m,other passed away in April 2015.  PLAN: 1. The patient is on IVC. We referred him to [facility] rehab facility.  2. Will continue CIWA protocol.  3. Will start prozac for depression and trazodone for sleep.  Electronic Signatures: [name], [name] (MD)  (Signed 06-Jul-15 18:11)  Authored: Brief Consult Note   Last Updated: 06-Jul-15 18:11 by [name], [name] (MD)
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
## 2014-07-03 NOTE — Discharge Summary (Signed)
PATIENT NAME:  Juan Wood, Auther F MR#:  [id number] DATE OF BIRTH:  05-Feb-1956  DATE OF ADMISSION:  04/22/2013 DATE OF DISCHARGE:  05/04/2013  HOSPITAL COURSE: See dictated history and physical for details of admission.   A 59 year old man with a history of alcohol abuse and mood symptoms came into the hospital intoxicated, having recently binged on drugs and alcohol. Reporting depression and symptoms of withdrawal. He went through an alcohol withdrawal treatment. No seizures, no delirium. Eventually was able to come off of all benzodiazepines. Complained of persistently depressed mood. Started on fluoxetine, tolerated it well, gradually showed improvement in his mood.   He does have chronic musculoskeletal pain which has been treated with ibuprofen and Robaxin with some improvement.   We had initially encouraged the patient to go to the alcohol and drug abuse treatment program, but he eventually declined, stating that he felt that he could work on his sobriety on his own and wanted to get back to work as soon as possible. His affect improved. His general self-care improved. His thoughts became more lucid and normal in speed and quality.   The patient was ultimately discharged to follow up with [facility] in the community. He has been educated about the dangers of continued alcohol abuse and agrees to engage in appropriate treatment to stop his drug use.   DISCHARGE MEDICATIONS: Fluoxetine 20 mg per day, trazodone 50 mg at night as needed for sleep, ibuprofen 800 mg every 8 hours as needed for pain, Robaxin 750 mg every 6 hours as needed for back pain.   LABORATORY RESULTS: Admission labs included a drug screen positive for cocaine and benzodiazepines. Urinalysis unremarkable. CBC: Low platelet count at 85. Chemistry panel: Elevated ALT 344, AST elevated at 181. Alcohol level was negative. Acetaminophen and salicylates negative. ALT and AST did improve during the time he was here. Ammonia was checked at one  point on the 15th and was only slightly elevated at 35. Magnesium normal.   MENTAL STATUS EXAMINATION AT DISCHARGE: Neatly dressed and groomed man who looks his stated age, cooperative with the interview. Eye contact good. Psychomotor activity normal. Speech normal rate, tone and volume. Affect euthymic, reactive, appropriate. Mood stated as fine. Thoughts are lucid without loosening of associations or delusions. Denies auditory or visual hallucinations. Denies suicidal or homicidal ideation. Shows improved judgment and insight and normal intelligence. Short and long-term memory grossly intact.   DISPOSITION: Discharged to stay with some friends. [facility].   DIAGNOSIS, PRINCIPAL AND PRIMARY:  AXIS I: Polysubstance dependence: Alcohol, cocaine, opiates, benzodiazepine,   SECONDARY DIAGNOSES:  AXIS I: Depression, not otherwise specified; major depression versus substance-induced mood disorder.  AXIS II: Deferred.  AXIS III: Chronic musculoskeletal pain; hepatitis C with chronic liver enzyme changes.  AXIS IV: Severe from homelessness; recent getting out of jail; minimal social support.  AXIS V: Functioning at time of discharge, 55.    ____________________________ Audery AmelJohn T. Saleha Kalp, MD jtc:np D: 05/04/2013 17:12:39 ET T: 05/04/2013 22:32:46 ET JOB#: [id number]  cc: Audery AmelJohn T. Travoris Bushey, MD, <Dictator> Audery AmelJOHN T Tavien Chestnut MD ELECTRONICALLY SIGNED 05/07/2013 0:43

## 2014-07-11 NOTE — Consult Note (Signed)
PATIENT NAME:  Juan Wood, Juan Wood MR#:  [id number] DATE OF BIRTH:  21-Oct-1955  DATE OF CONSULTATION:  04/05/2014  REFERRING PHYSICIAN:   CONSULTING PHYSICIAN:  Audery Amel, MD  IDENTIFYING INFORMATION AND REASON FOR CONSULTATION: A 59 year old man with history of alcohol abuse and mood symptoms presents to the hospital.   CHIEF COMPLAINT: "I messed up yesterday."   HISTORY OF PRESENT ILLNESS: The patient tells me that he got to drinking yesterday and got drunk. He got in an argument involving his wife and a next door neighbor. Thinks he may have taken some other drugs, but says at some point he sort of blacked out and could not remember much more about it. Denies that he tried to kill himself. Denies that he had seriously tried to hurt anybody else. Reports that he has been maintaining only intermittent sobriety, but had not been drinking regularly, maybe only about 1 time a week. He denies that he is abusing any other drugs regularly. It sounds like he is currently going to his primary care doctor and getting an antidepressant, but not necessarily involved in any other active mental health treatment. He denies any suicidal ideation, says that his mood however has been more down and anxious recently. Feels a little bit more hopeless. No evidence of psychosis. There is no involuntary commitment filed. [place] police however escorted him in here voluntarily.   PAST PSYCHIATRIC HISTORY: History of alcohol abuse. He was last here at our hospital in the Emergency Room in July of 2015 at which time he was sent to the alcohol and drug abuse treatment center. He has had intermittent success with sobriety, but has not been able to have long term sobriety in quite a while. He has a history of depression and agitation when he is intoxicated, but no known suicide attempts. He responded well to Prozac, which we gave him last year and has continued to take Prozac since then. No other psychiatric hospitalization since  earlier in 2015.   PAST MEDICAL HISTORY: History of hepatitis C, history of chronic pain. No known history of seizures or DTs. He also tells me he has Raynaud disease which has been actually very severe and impairing and causing him a lot of pain and is a chief complaint of his.   FAMILY HISTORY: Positive for some substance abuse.   SOCIAL HISTORY: Living with his wife. Nobody else that is at home. Not working. Chronic anxiety and stress at home.   CURRENT MEDICATIONS: All we know of is Prozac 20 mg a day, amlodipine 5 mg a day, aspirin once a day, he says he is also taking thyroid medication once a day, it is not on his list however.   ALLERGIES: No known drug allergies.   REVIEW OF SYSTEMS: Anxious. Chronic pain in his hands and back. Sick to his stomach. A little bit of nausea, but not vomiting right now. Feeling kind of jittery. No other physical problems.   MENTAL STATUS EXAMINATION: A somewhat disheveled gentleman who looks his stated age or older. Cooperative with the interview. Eye contact intermittent. Psychomotor activity sluggish. Speech is decreased in total amount and is quiet, but understandable. Able to carry on a lucid conversation. Affect dysphoric and blunted, not tearful. Mood stated as being not so good. Thoughts are lucid without loosening of associations. Denies auditory or visual hallucinations. Denies suicidal or homicidal ideation. He can repeat 3 words immediately, remembers 2 of them at 3 minutes. He is alert and oriented x 4. Judgment  and insight appear to be reasonably intact. Intelligence normal.   LABORATORY RESULTS: Salicylates slightly elevated at 4.8 but nontoxic. Alcohol level on presentation last night was 212. His chemistry panel just shows an elevated ALT at 322, elevated AST at 228. CBC, low platelet count at 119,000. Urinalysis unremarkable. Drug screen positive for cocaine.   VITAL SIGNS: Blood pressure 105/66, respirations 18, pulse 78, temperature 98.7.    ASSESSMENT: A 59 year old man with a history of alcohol abuse continuing to drink to the point of blacking out and getting into fights. Not acutely suicidal or homicidal. No history of seizures or DTs. The patient does not require hospital level treatment, but says that he really wants to get more substance abuse treatment and his wife says that she is thinking about not letting him come home until he get some treatment.   TREATMENT PLAN: Continue current medication. I have proposed that we refer him to [facility]. We will get a referral done there. Recheck alcohol level. If that is not going to pan out we can refer him to [facility] as well. The patient is agreeable to plan.   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   SECONDARY DIAGNOSES:  AXIS I:  1.  Alcohol abuse, moderate to severe.  2.  Cocaine abuse, moderate to severe.   3.  Depression not otherwise specified.    AXIS II: Deferred.   AXIS III:  1.  Hepatitis C under control.  2.  Hypothyroidism.  3.  Chronic pain.  4.  Raynaud phenomenon.    ____________________________ Audery AmelJohn T. Marla Pouliot, MD jtc:bu D: 04/05/2014 12:44:35 ET T: 04/05/2014 12:52:13 ET JOB#: [id number]  cc: Audery AmelJohn T. Farhan Jean, MD, <Dictator> Audery AmelJOHN T Arie Powell MD ELECTRONICALLY SIGNED 04/21/2014 17:20

## 2014-07-27 ENCOUNTER — Ambulatory Visit: Payer: Self-pay

## 2014-07-27 ENCOUNTER — Other Ambulatory Visit: Payer: Self-pay

## 2014-08-03 ENCOUNTER — Ambulatory Visit: Payer: Self-pay

## 2014-08-04 DIAGNOSIS — R110 Nausea: Secondary | ICD-10-CM | POA: Insufficient documentation

## 2014-08-04 DIAGNOSIS — I10 Essential (primary) hypertension: Secondary | ICD-10-CM | POA: Insufficient documentation

## 2014-08-04 DIAGNOSIS — F10229 Alcohol dependence with intoxication, unspecified: Secondary | ICD-10-CM | POA: Insufficient documentation

## 2014-08-04 DIAGNOSIS — Z720 Tobacco use: Secondary | ICD-10-CM | POA: Insufficient documentation

## 2014-08-04 LAB — URINE DRUG SCREEN, QUALITATIVE (ARMC ONLY)
AMPHETAMINES, UR SCREEN: NOT DETECTED
BARBITURATES, UR SCREEN: NOT DETECTED
Benzodiazepine, Ur Scrn: NOT DETECTED
CANNABINOID 50 NG, UR ~~LOC~~: NOT DETECTED
COCAINE METABOLITE, UR ~~LOC~~: NOT DETECTED
MDMA (ECSTASY) UR SCREEN: NOT DETECTED
Methadone Scn, Ur: NOT DETECTED
OPIATE, UR SCREEN: NOT DETECTED
Phencyclidine (PCP) Ur S: NOT DETECTED
Tricyclic, Ur Screen: NOT DETECTED

## 2014-08-04 LAB — CBC
HCT: 42.6 % (ref 40.0–52.0)
HEMOGLOBIN: 14.2 g/dL (ref 13.0–18.0)
MCH: 30.9 pg (ref 26.0–34.0)
MCHC: 33.2 g/dL (ref 32.0–36.0)
MCV: 93 fL (ref 80.0–100.0)
Platelets: 132 10*3/uL — ABNORMAL LOW (ref 150–440)
RBC: 4.58 MIL/uL (ref 4.40–5.90)
RDW: 14.1 % (ref 11.5–14.5)
WBC: 5.1 10*3/uL (ref 3.8–10.6)

## 2014-08-04 LAB — COMPREHENSIVE METABOLIC PANEL
ALK PHOS: 76 U/L (ref 38–126)
ALT: 74 U/L — AB (ref 17–63)
ANION GAP: 9 (ref 5–15)
AST: 75 U/L — ABNORMAL HIGH (ref 15–41)
Albumin: 4 g/dL (ref 3.5–5.0)
BUN: 9 mg/dL (ref 6–20)
CALCIUM: 9 mg/dL (ref 8.9–10.3)
CO2: 25 mmol/L (ref 22–32)
Chloride: 101 mmol/L (ref 101–111)
Creatinine, Ser: 1.16 mg/dL (ref 0.61–1.24)
GFR calc Af Amer: >60 mL/min (ref >60)
GLUCOSE: 108 mg/dL — AB (ref 65–99)
POTASSIUM: 3.5 mmol/L (ref 3.5–5.1)
SODIUM: 135 mmol/L (ref 135–145)
Total Bilirubin: 0.4 mg/dL (ref 0.3–1.2)
Total Protein: 7.7 g/dL (ref 6.5–8.1)

## 2014-08-04 LAB — SALICYLATE LEVEL

## 2014-08-04 LAB — ETHANOL: Alcohol, Ethyl (B): 197 mg/dL — ABNORMAL HIGH (ref <5)

## 2014-08-04 LAB — ACETAMINOPHEN LEVEL

## 2014-08-04 NOTE — ED Notes (Signed)
Pt wants detox from cocaine, xanax, marajuana and alcohol. Last use cocaine and xanax Sunday. Pt alert and oriented X4, active, cooperative, pt in NAD. RR even and unlabored, color WNL.

## 2014-08-05 ENCOUNTER — Emergency Department
Admission: EM | Admit: 2014-08-05 | Discharge: 2014-08-05 | Disposition: A | Discharge status: Home / Self Care | Payer: Self-pay | Attending: Emergency Medicine | Admitting: Emergency Medicine

## 2014-08-05 DIAGNOSIS — F10220 Alcohol dependence with intoxication, uncomplicated: Secondary | ICD-10-CM

## 2014-08-05 HISTORY — DX: Essential (primary) hypertension: I10

## 2014-08-05 LAB — TROPONIN I

## 2014-08-05 MED ORDER — LORAZEPAM 2 MG PO TABS: 0.0000 mg | ORAL_TABLET | Freq: Four times a day (QID) | ORAL | Status: DC

## 2014-08-05 MED ORDER — ONDANSETRON 4 MG PO TBDP
ORAL_TABLET | ORAL | Status: AC
  Filled 2014-08-05: qty 1

## 2014-08-05 MED ORDER — LORAZEPAM 1 MG PO TABS
ORAL_TABLET | ORAL | Status: AC
  Administered 2014-08-05: 1 mg via ORAL
  Filled 2014-08-05: qty 1

## 2014-08-05 MED ORDER — ONDANSETRON 4 MG PO TBDP
ORAL_TABLET | ORAL | Status: AC
  Administered 2014-08-05: 4 mg via ORAL
  Filled 2014-08-05: qty 1

## 2014-08-05 MED ORDER — ONDANSETRON 4 MG PO TBDP
4.0000 mg | ORAL_TABLET | Freq: Once | ORAL | Status: AC
  Administered 2014-08-05: 4 mg via ORAL

## 2014-08-05 MED ORDER — LORAZEPAM 1 MG PO TABS
1.0000 mg | ORAL_TABLET | Freq: Once | ORAL | Status: AC
  Administered 2014-08-05: 1 mg via ORAL

## 2014-08-05 MED ORDER — VITAMIN B-1 100 MG PO TABS: 100.0000 mg | ORAL_TABLET | Freq: Every day | ORAL | Status: DC

## 2014-08-05 MED ORDER — LORAZEPAM 1 MG PO TABS
ORAL_TABLET | ORAL | Status: AC
  Filled 2014-08-05: qty 1

## 2014-08-05 MED ORDER — LORAZEPAM 2 MG PO TABS
0.0000 mg | ORAL_TABLET | Freq: Two times a day (BID) | ORAL | Status: DC
  Administered 2014-08-05: 1 mg via ORAL

## 2014-08-05 NOTE — ED Notes (Signed)

## 2014-08-05 NOTE — BH Assessment (Signed)
Assessment Note  Juan Wood is an 59 y.o. male, who presents to the ED via a friend requesting assistance with detox from alcohol)drinking 12-12 beers/day); xanax(2-4 pills/day); crack cocaine($40.00 worth/day). Per client, "I just need to stop; I'm getting too old for this; I need some help."   Axis I: Alcohol Abuse and Substance Abuse Axis II: Deferred Axis III:  Past Medical History  Diagnosis Date  . Depression   . Hepatitis C   . Alcohol abuse   . Drug abuse   . Hypertension    Axis IV: problems with access to health care services and problems with primary support group Axis V: 61-70 mild symptoms  Past Medical History:  Past Medical History  Diagnosis Date  . Depression   . Hepatitis C   . Alcohol abuse   . Drug abuse   . Hypertension     Past Surgical History  Procedure Laterality Date  . Hernia repair    . Knee surgery      right    Family History:  Family History  Problem Relation Age of Onset  . Heart attack Father     Social History:  reports that he has been smoking.  He does not have any smokeless tobacco history on file. He reports that he drinks alcohol. He reports that he uses illicit drugs (Cocaine, Opium, and Marijuana).  Additional Social History:     CIWA: CIWA-Ar BP: 113/60 mmHg COWS:    Allergies: No Known Allergies  Home Medications:  (Not in a hospital admission)  OB/GYN Status:  No LMP for male patient.  General Assessment Data Location of Assessment: [facility] ED TTS Assessment: In system Is this a Tele or Face-to-Face Assessment?: Face-to-Face Is this an Initial Assessment or a Re-assessment for this encounter?: Initial Assessment Marital status: Single Maiden name: none Is patient pregnant?: No Pregnancy Status: No Living Arrangements: Alone Can pt return to current living arrangement?: Yes Admission Status: Voluntary Is patient capable of signing voluntary admission?: Yes Referral Source: Self/Family/Friend Insurance  type: none  Medical Screening Exam [facility] Walk-in ONLY) Medical Exam completed: Yes  Crisis Care Plan Living Arrangements: Alone Name of Psychiatrist: none Name of Therapist: none  Education Status Is patient currently in school?: No Current Grade: n/a Highest grade of school patient has completed: 10th Name of school: n/a Contact person: none  Risk to self with the past 6 months Suicidal Ideation: No Has patient been a risk to self within the past 6 months prior to admission? : No Suicidal Intent: No Has patient had any suicidal intent within the past 6 months prior to admission? : No Is patient at risk for suicide?: No Suicidal Plan?: No Has patient had any suicidal plan within the past 6 months prior to admission? : No Access to Means: No What has been your use of drugs/alcohol within the last 12 months?: alocohol; xanax; cannibis; cocaine Previous Attempts/Gestures: No How many times?: 0 Other Self Harm Risks: 0 Triggers for Past Attempts: None known Intentional Self Injurious Behavior: None Family Suicide History: No Recent stressful life event(s): Other (Comment) (drug use) Persecutory voices/beliefs?: No Depression: No Depression Symptoms:  ("none") Substance abuse history and/or treatment for substance abuse?: Yes Suicide prevention information given to non-admitted patients: Not applicable  Risk to Others within the past 6 months Homicidal Ideation: No Does patient have any lifetime risk of violence toward others beyond the six months prior to admission? : No Thoughts of Harm to Others: No Current Homicidal Intent: No  Current Homicidal Plan: No Access to Homicidal Means: No Identified Victim: none History of harm to others?: No Assessment of Violence: On admission Violent Behavior Description: none Does patient have access to weapons?: No Criminal Charges Pending?: No Does patient have a court date: No Is patient on probation?:  No  Psychosis Hallucinations: None noted Delusions: None noted  Mental Status Report Appearance/Hygiene: In scrubs, Unremarkable Eye Contact: Good Motor Activity: Unremarkable Speech: Logical/coherent Level of Consciousness: Alert Mood: Anxious Affect: Anxious Anxiety Level: Minimal Thought Processes: Coherent, Relevant Judgement: Partial Orientation: Person, Place, Time, Situation Obsessive Compulsive Thoughts/Behaviors: None  Cognitive Functioning Concentration: Good Memory: Recent Intact, Remote Intact IQ: Average Insight: Fair Impulse Control: Fair Appetite: Fair Weight Loss: 0 Weight Gain: 0 Sleep: No Change Total Hours of Sleep: 5 Vegetative Symptoms: None  ADLScreening [facility]) Patient's cognitive ability adequate to safely complete daily activities?: Yes Patient able to express need for assistance with ADLs?: Yes Independently performs ADLs?: Yes (appropriate for developmental age)  Prior Inpatient Therapy Prior Inpatient Therapy: No Prior Therapy Dates: unknown Prior Therapy Facilty/Provider(s): unknown Reason for Treatment: detox  Prior Outpatient Therapy Prior Outpatient Therapy: No Prior Therapy Dates: unknown Prior Therapy Facilty/Provider(s): unknown Reason for Treatment: detox Does patient have an ACCT team?: No Does patient have Intensive In-House Services?  : No Does patient have [facility] services? : No Does patient have [facility] services?: No  ADL Screening (condition at time of admission) Patient's cognitive ability adequate to safely complete daily activities?: Yes Patient able to express need for assistance with ADLs?: Yes Independently performs ADLs?: Yes (appropriate for developmental age)       Abuse/Neglect Assessment (Assessment to be complete while patient is alone) Physical Abuse: Denies Verbal Abuse: Denies Sexual Abuse: Denies Exploitation of patient/patient's resources: Denies Self-Neglect: Denies Values /  Beliefs Cultural Requests During Hospitalization: None Spiritual Requests During Hospitalization: None Consults Spiritual Care Consult Needed: No Social Work Consult Needed: No Merchant navy officerAdvance Directives (For Healthcare) Does patient have an advance directive?: No Would patient like information on creating an advanced directive?: Yes English as a second language teacher- Educational materials given    Additional Information 1:1 In Past 12 Months?: No CIRT Risk: No Elopement Risk: No Does patient have medical clearance?: Yes  Child/Adolescent Assessment Running Away Risk: Denies Bed-Wetting: Denies Destruction of Property: Denies Cruelty to Animals: Denies Stealing: Denies Rebellious/Defies Authority: Denies Satanic Involvement: Denies Archivistire Setting: Denies Problems at Progress EnergySchool: Denies Gang Involvement: Denies  Disposition:  Disposition Initial Assessment Completed for this Encounter: Yes Disposition of Patient: Referred to Patient referred to: [facility]  On Site Evaluation by:   Reviewed with Physician:    Dwan BoltMargaret Rowland Ericsson 08/05/2014 4:59 AM

## 2014-08-05 NOTE — ED Provider Notes (Addendum)
[facility] Emergency Department Provider Note  ____________________________________________  Time seen: Approximately 0010 AM  I have reviewed the triage vital signs and the nursing notes.   HISTORY  Chief Complaint Medical Clearance    HPI Juan Wood is a 59 y.o. male who presents for medical clearance to go to [facility] for detox from alcohol and cocaine.Patient states he drinks approximately 2-3 40 ounce beers daily. Admits to last cocaine use over 1 week ago. Does state he had chest pain on cocaine use at the time. Currently denies chest pain, shortness of breath, abdominal pain, vomiting, diarrhea, headache. States he is currently feeling nauseous and shaky. Denies SI/HI/AH/VH.   Past Medical History  Diagnosis Date  . Depression   . Hepatitis C   . Alcohol abuse   . Drug abuse   . Hypertension     There are no active problems to display for this patient.   Past Surgical History  Procedure Laterality Date  . Hernia repair    . Knee surgery      right    No current outpatient prescriptions on file.  Allergies Review of patient's allergies indicates no known allergies.  Family History  Problem Relation Age of Onset  . Heart attack Father     Social History History  Substance Use Topics  . Smoking status: Current Every Day Smoker -- 1.00 packs/day for 35 years  . Smokeless tobacco: Not on file  . Alcohol Use: Yes     Comment: occasional    Review of Systems Constitutional: No fever/chills Eyes: No visual changes. ENT: No sore throat. Cardiovascular: Denies chest pain. Respiratory: Denies shortness of breath. Gastrointestinal: No abdominal pain.  Positive for nausea. No vomiting.  No diarrhea.  No constipation. Genitourinary: Negative for dysuria. Musculoskeletal: Negative for back pain. Skin: Negative for rash. Neurological: Negative for headaches, focal weakness or numbness. Psychiatric:Negative for suicidal  ideation.  10-point ROS otherwise negative.  ____________________________________________   PHYSICAL EXAM:  VITAL SIGNS: ED Triage Vitals  Enc Vitals Group     BP 08/04/14 1951 113/60 mmHg     Pulse --      Resp 08/04/14 1951 18     Temp 08/04/14 1951 98.3 F (36.8 C)     Temp Source 08/04/14 1951 Oral     SpO2 08/04/14 1951 98 %     Weight 08/04/14 1951 135 lb (61.236 kg)     Height 08/04/14 1951 5\' 10"  (1.778 m)     Head Cir --      Peak Flow --      Pain Score --      Pain Loc --      Pain Edu? --      Excl. in GC? --     Constitutional: Alert and oriented. Well appearing and in no acute distress. Eyes: Conjunctivae are normal. PERRL. EOMI. Head: Atraumatic. Nose: No congestion/rhinnorhea. Mouth/Throat: Mucous membranes are moist.  Oropharynx non-erythematous. Neck: No stridor.   Cardiovascular: Normal rate, regular rhythm. Grossly normal heart sounds.  Good peripheral circulation. Respiratory: Normal respiratory effort.  No retractions. Lungs CTAB. Gastrointestinal: Soft and nontender. No distention. No abdominal bruits. No CVA tenderness. Musculoskeletal: No lower extremity tenderness nor edema.  No joint effusions. Neurologic:  Normal speech and language. No gross focal neurologic deficits are appreciated. Speech is normal. No asterixis. No gait instability. Skin:  Skin is warm, dry and intact. No rash noted. Psychiatric: Mood and affect are normal. Speech and behavior are normal.  ____________________________________________   LABS (all labs ordered are listed, but only abnormal results are displayed)  Labs Reviewed  ACETAMINOPHEN LEVEL - Abnormal; Notable for the following:    Acetaminophen (Tylenol), Serum <10 (*)    All other components within normal limits  CBC - Abnormal; Notable for the following:    Platelets 132 (*)    All other components within normal limits  COMPREHENSIVE METABOLIC PANEL - Abnormal; Notable for the following:    Glucose, Bld  108 (*)    AST 75 (*)    ALT 74 (*)    All other components within normal limits  ETHANOL - Abnormal; Notable for the following:    Alcohol, Ethyl (B) 197 (*)    All other components within normal limits  SALICYLATE LEVEL  URINE DRUG SCREEN, QUALITATIVE ([facility] ONLY)  TROPONIN I   ____________________________________________  EKG  ED ECG REPORT I, Langley Ingalls J, the attending physician, personally viewed and interpreted this ECG.   Date: 08/05/2014  EKG Time: 0005  Rate: 50  Rhythm: sinus bradycardia  Axis: LAD  Intervals:none  ST&T Change: Nonspecific  ____________________________________________  RADIOLOGY  None ____________________________________________   PROCEDURES  Procedure(s) performed: None  Critical Care performed: No  ____________________________________________   INITIAL IMPRESSION / ASSESSMENT AND PLAN / ED COURSE  Pertinent labs & imaging results that were available during my care of the patient were reviewed by me and considered in my medical decision making (see chart for details).  60 year old male who desires detox from alcohol and cocaine. Will obtain EKG and troponin given patient's cocaine use. Denies chest pain, SI/HI/AH/VH currently. Behavioral medicine intake nurse to evaluate patient in ED.  ----------------------------------------- 8:09 AM on 08/05/2014 -----------------------------------------  Awaiting bed availability at [facility]. Patient currently resting in no acute distress.  ----------------------------------------- 8:52 AM on 08/05/2014 -----------------------------------------  Patient has acceptance at [facility]. Awaiting transport. Currently resting in no acute distress. ____________________________________________   FINAL CLINICAL IMPRESSION(S) / ED DIAGNOSES  Final diagnoses:  Alcohol dependence with uncomplicated intoxication      Irean Hong, MD 08/05/14 1610  Irean Hong, MD 08/05/14 [phone]

## 2014-08-05 NOTE — ED Notes (Signed)
BEHAVIORAL HEALTH ROUNDING Patient sleeping: No. Patient alert and oriented: yes Behavior appropriate: Yes.  ; If no, describe:  Nutrition and fluids offered: Yes  Toileting and hygiene offered: Yes  Sitter present: no Law enforcement present: Yes  

## 2014-08-05 NOTE — ED Notes (Signed)

## 2014-08-05 NOTE — ED Notes (Signed)
BEHAVIORAL HEALTH ROUNDING Patient sleeping: Yes.   Patient alert and oriented: not applicable Behavior appropriate: Yes.  ; If no, describe:  Nutrition and fluids offered: Yes  Toileting and hygiene offered: Yes  Sitter present: no Law enforcement present: Yes  

## 2014-08-05 NOTE — ED Notes (Addendum)

## 2014-08-05 NOTE — Discharge Instructions (Signed)
1. Proceed directly to [facility]. 2. Return to the ER for worsening symptoms, persistent vomiting, difficulty breathing or other concerns.  Finding Treatment for Alcohol and Drug Addiction It can be hard to find the right place to get professional treatment. Here are some important things to consider:  There are different types of treatment to choose from.  Some programs are live-in (residential) while others are not (outpatient). Sometimes a combination is offered.  No single type of program is right for everyone.  Most treatment programs involve a combination of education, counseling, and a 12-step, spiritually-based approach.  There are non-spiritually based programs (not 12-step).  Some treatment programs are government sponsored. They are geared for patients without private insurance.  Treatment programs can vary in many respects such as:  Cost and types of insurance accepted.  Types of on-site medical services offered.  Length of stay, setting, and size.  Overall philosophy of treatment. A person may need specialized treatment or have needs not addressed by all programs. For example, adolescents need treatment appropriate for their age. Other people have secondary disorders that must be managed as well. Secondary conditions can include mental illness, such as depression or diabetes. Often, a period of detoxification from alcohol or drugs is needed. This requires medical supervision and not all programs offer this. THINGS TO CONSIDER WHEN SELECTING A TREATMENT PROGRAM   Is the program certified by the appropriate government agency? Even private programs must be certified and employ certified professionals.  Does the program accept your insurance? If not, can a payment plan be set up?  Is the facility clean, organized, and well run? Do they allow you to speak with graduates who can share their treatment experience with you? Can you tour the facility? Can you meet with staff?  Does  the program meet the full range of individual needs?  Does the treatment program address sexual orientation and physical disabilities? Do they provide age, gender, and culturally appropriate treatment services?  Is treatment available in languages other than English?  Is long-term aftercare support or guidance encouraged and provided?  Is assessment of an individual's treatment plan ongoing to ensure it meets changing needs?  Does the program use strategies to encourage reluctant patients to remain in treatment long enough to increase the likelihood of success?  Does the program offer counseling (individual or group) and other behavioral therapies?  Does the program offer medicine as part of the treatment regimen, if needed?  Is there ongoing monitoring of possible relapse? Is there a defined relapse prevention program? Are services or referrals offered to family members to ensure they understand addiction and the recovery process? This would help them support the recovering individual.  Are 12-step meetings held at the center or is transport available for patients to attend outside meetings? In countries outside of the [place], Magazine features editorsee local directories for contact information for services in your area. Document Released: 01/25/2005 Document Revised: 05/21/2011 Document Reviewed: 08/07/2007 [facility] Patient Information 2015 [place], [place]. This information is not intended to replace advice given to you by your health care provider. Make sure you discuss any questions you have with your health care provider.  Alcohol Intoxication Alcohol intoxication occurs when the amount of alcohol that a person has consumed impairs his or her ability to mentally and physically function. Alcohol directly impairs the normal chemical activity of the brain. Drinking large amounts of alcohol can lead to changes in mental function and behavior, and it can cause many physical effects that can  be harmful.    Alcohol intoxication can range in severity from mild to very severe. Various factors can affect the level of intoxication that occurs, such as the person's age, gender, weight, frequency of alcohol consumption, and the presence of other medical conditions (such as diabetes, seizures, or heart conditions). Dangerous levels of alcohol intoxication may occur when people drink large amounts of alcohol in a short period (binge drinking). Alcohol can also be especially dangerous when combined with certain prescription medicines or "recreational" drugs. SIGNS AND SYMPTOMS Some common signs and symptoms of mild alcohol intoxication include:  Loss of coordination.  Changes in mood and behavior.  Impaired judgment.  Slurred speech. As alcohol intoxication progresses to more severe levels, other signs and symptoms will appear. These may include:  Vomiting.  Confusion and impaired memory.  Slowed breathing.  Seizures.  Loss of consciousness. DIAGNOSIS  Your health care provider will take a medical history and perform a physical exam. You will be asked about the amount and type of alcohol you have consumed. Blood tests will be done to measure the concentration of alcohol in your blood. In many places, your blood alcohol level must be lower than 80 mg/dL (9.14%) to legally drive. However, many dangerous effects of alcohol can occur at much lower levels.  TREATMENT  People with alcohol intoxication often do not require treatment. Most of the effects of alcohol intoxication are temporary, and they go away as the alcohol naturally leaves the body. Your health care provider will monitor your condition until you are stable enough to go home. Fluids are sometimes given through an IV access tube to help prevent dehydration.  HOME CARE INSTRUCTIONS  Do not drive after drinking alcohol.  Stay hydrated. Drink enough water and fluids to keep your urine clear or pale yellow. Avoid caffeine.   Only take  over-the-counter or prescription medicines as directed by your health care provider.  SEEK MEDICAL CARE IF:   You have persistent vomiting.   You do not feel better after a few days.  You have frequent alcohol intoxication. Your health care provider can help determine if you should see a substance use treatment counselor. SEEK IMMEDIATE MEDICAL CARE IF:   You become shaky or tremble when you try to stop drinking.   You shake uncontrollably (seizure).   You throw up (vomit) blood. This may be bright red or may look like black coffee grounds.   You have blood in your stool. This may be bright red or may appear as a black, tarry, bad smelling stool.   You become lightheaded or faint.  MAKE SURE YOU:   Understand these instructions.  Will watch your condition.  Will get help right away if you are not doing well or get worse. Document Released: 12/06/2004 Document Revised: 10/29/2012 Document Reviewed: 08/01/2012 [facility] Patient Information 2015 [place], [place]. This information is not intended to replace advice given to you by your health care provider. Make sure you discuss any questions you have with your health care provider.

## 2014-08-05 NOTE — ED Notes (Signed)
Patient assigned to appropriate care area based on presenting need for behavioral health evaluation. Patient oriented to unit/care area by nursing staff. Patient informed that care areas are designed for safety and monitored by security cameras at all times in order to promote and sure safety for both them and the staff assigned to their care. Visiting hours, phone use, daily routines, meal/snack schedule explained in detail to patient. Patient verbalized understanding of the instructions and information provided to them by nursing staff and was given the opportunity to ask questions related to their individualized plan of care as it stands at this time. Patient provided a verbal contract for safety to this RN and agrees to promptly notify a staff member should any changes occur that would lead to them experiencing thoughts of harming themselves or anyone else. 

## 2014-08-05 NOTE — ED Notes (Signed)
BEHAVIORAL HEALTH ROUNDING Patient sleeping: Yes.   Patient alert and oriented: not applicable Behavior appropriate: Yes.  ; If no, describe:  Nutrition and fluids offered: No Toileting and hygiene offered: No Sitter present: no Law enforcement present: Yes  

## 2014-08-05 NOTE — BHH Counselor (Addendum)
Received phone call from [facility] ([phone]), pt. is accepted and they will call back with pick up time.  Updated ER MD (Dr. Lorie ApleyP. Malinda).

## 2014-09-07 ENCOUNTER — Other Ambulatory Visit: Payer: Self-pay

## 2014-09-07 LAB — TSH: TSH: 2.82 u[IU]/mL (ref 0.41–5.90)

## 2014-09-16 ENCOUNTER — Ambulatory Visit: Payer: Self-pay

## 2014-09-16 LAB — CBC AND DIFFERENTIAL
NEUTROS ABS: 2 /uL
WBC: 4.6 10^3/mL

## 2014-09-16 LAB — LIPID PANEL
Cholesterol: 117 mg/dL (ref 0–200)
HDL: 38 mg/dL (ref 35–70)
LDL CALC: 62 mg/dL
Triglycerides: 86 mg/dL (ref 40–160)

## 2014-09-16 LAB — BASIC METABOLIC PANEL
BUN: 11 mg/dL (ref 4–21)
Creatinine: 1 mg/dL (ref 0.6–1.3)
Glucose: 89 mg/dL
SODIUM: 144 mmol/L (ref 137–147)

## 2014-09-16 LAB — HEPATIC FUNCTION PANEL: BILIRUBIN, TOTAL: 0.3 mg/dL

## 2014-09-16 LAB — POCT ERYTHROCYTE SEDIMENTATION RATE, NON-AUTOMATED: SED RATE: 8 mm

## 2014-09-17 ENCOUNTER — Other Ambulatory Visit: Payer: Self-pay | Admitting: Nurse Practitioner

## 2014-09-17 DIAGNOSIS — R61 Generalized hyperhidrosis: Secondary | ICD-10-CM

## 2014-09-20 ENCOUNTER — Ambulatory Visit
Admission: RE | Admit: 2014-09-20 | Discharge: 2014-09-20 | Disposition: A | Discharge status: Home / Self Care | Payer: PRIVATE HEALTH INSURANCE | Source: Ambulatory Visit | Attending: Nurse Practitioner | Admitting: Nurse Practitioner

## 2014-09-20 DIAGNOSIS — R61 Generalized hyperhidrosis: Secondary | ICD-10-CM

## 2014-09-20 DIAGNOSIS — J449 Chronic obstructive pulmonary disease, unspecified: Secondary | ICD-10-CM | POA: Insufficient documentation

## 2014-10-05 ENCOUNTER — Ambulatory Visit: Payer: Self-pay

## 2014-10-12 ENCOUNTER — Other Ambulatory Visit: Payer: Self-pay

## 2014-10-19 ENCOUNTER — Other Ambulatory Visit: Payer: Self-pay

## 2014-12-28 ENCOUNTER — Other Ambulatory Visit: Payer: Self-pay

## 2014-12-30 ENCOUNTER — Encounter: Payer: Self-pay | Admitting: Emergency Medicine

## 2014-12-30 ENCOUNTER — Emergency Department
Admission: EM | Admit: 2014-12-30 | Discharge: 2014-12-30 | Disposition: A | Discharge status: Other Acute Inpatient Hospital | Payer: PRIVATE HEALTH INSURANCE | Attending: Student | Admitting: Student

## 2014-12-30 DIAGNOSIS — F1010 Alcohol abuse, uncomplicated: Secondary | ICD-10-CM | POA: Insufficient documentation

## 2014-12-30 DIAGNOSIS — F1310 Sedative, hypnotic or anxiolytic abuse, uncomplicated: Secondary | ICD-10-CM | POA: Insufficient documentation

## 2014-12-30 DIAGNOSIS — Z720 Tobacco use: Secondary | ICD-10-CM | POA: Insufficient documentation

## 2014-12-30 DIAGNOSIS — F1410 Cocaine abuse, uncomplicated: Secondary | ICD-10-CM | POA: Insufficient documentation

## 2014-12-30 DIAGNOSIS — F1910 Other psychoactive substance abuse, uncomplicated: Secondary | ICD-10-CM

## 2014-12-30 DIAGNOSIS — I10 Essential (primary) hypertension: Secondary | ICD-10-CM | POA: Insufficient documentation

## 2014-12-30 HISTORY — DX: Disorder of thyroid, unspecified: E07.9

## 2014-12-30 LAB — COMPREHENSIVE METABOLIC PANEL
ALT: 26 U/L (ref 17–63)
ANION GAP: 7 (ref 5–15)
AST: 35 U/L (ref 15–41)
Albumin: 4.2 g/dL (ref 3.5–5.0)
Alkaline Phosphatase: 63 U/L (ref 38–126)
BUN: 15 mg/dL (ref 6–20)
CALCIUM: 9.7 mg/dL (ref 8.9–10.3)
CHLORIDE: 102 mmol/L (ref 101–111)
CO2: 27 mmol/L (ref 22–32)
Creatinine, Ser: 0.89 mg/dL (ref 0.61–1.24)
Glucose, Bld: 135 mg/dL — ABNORMAL HIGH (ref 65–99)
Potassium: 3.8 mmol/L (ref 3.5–5.1)
SODIUM: 136 mmol/L (ref 135–145)
Total Bilirubin: 0.6 mg/dL (ref 0.3–1.2)
Total Protein: 7.5 g/dL (ref 6.5–8.1)

## 2014-12-30 LAB — URINE DRUG SCREEN, QUALITATIVE (ARMC ONLY)
AMPHETAMINES, UR SCREEN: NOT DETECTED
BENZODIAZEPINE, UR SCRN: POSITIVE — AB
Barbiturates, Ur Screen: NOT DETECTED
Cannabinoid 50 Ng, Ur ~~LOC~~: NOT DETECTED
Cocaine Metabolite,Ur ~~LOC~~: POSITIVE — AB
MDMA (ECSTASY) UR SCREEN: NOT DETECTED
METHADONE SCREEN, URINE: NOT DETECTED
Opiate, Ur Screen: NOT DETECTED
PHENCYCLIDINE (PCP) UR S: NOT DETECTED
Tricyclic, Ur Screen: NOT DETECTED

## 2014-12-30 LAB — CBC
HEMATOCRIT: 46 % (ref 40.0–52.0)
Hemoglobin: 15.6 g/dL (ref 13.0–18.0)
MCH: 31.5 pg (ref 26.0–34.0)
MCHC: 33.8 g/dL (ref 32.0–36.0)
MCV: 92.9 fL (ref 80.0–100.0)
PLATELETS: 104 10*3/uL — AB (ref 150–440)
RBC: 4.95 MIL/uL (ref 4.40–5.90)
RDW: 13.7 % (ref 11.5–14.5)
WBC: 3.7 10*3/uL — AB (ref 3.8–10.6)

## 2014-12-30 LAB — ETHANOL

## 2014-12-30 MED ORDER — DIAZEPAM 5 MG PO TABS
10.0000 mg | ORAL_TABLET | Freq: Once | ORAL | Status: AC
  Administered 2014-12-30: 10 mg via ORAL
  Filled 2014-12-30: qty 2

## 2014-12-30 NOTE — ED Notes (Signed)
TTS has completed his consult  Pt to be referred to [facility]   Ginger ale provided

## 2014-12-30 NOTE — BH Assessment (Signed)
Assessment Note  Juan Wood is an 59 y.o. male presents to ER due to seeking SA Treatment & Detox.  Patient admits to using; Alcohol, Cocaine & Xanax. His reported symptoms of withdrawal are: shakes, dizziness and cold Chills and headaches. He denies having a history of seizures and blackouts.  Patient is able to perform her ADL's with no assistance or devices. He's calm, cooperative and pleasant. He has no current involvement with the legal system.     UDS positive for Cocaine and Benzo's. His ethol <5 Patient is denying SI/HI and V/H  Diagnosis: Alcohol Use Disorder; Severe                    Cocaine Use Disorder; Severe  Past Medical History:  Past Medical History  Diagnosis Date  . Depression   . Hepatitis C   . Alcohol abuse   . Drug abuse   . Hypertension   . Thyroid disease     Past Surgical History  Procedure Laterality Date  . Hernia repair    . Knee surgery      right    Family History:  Family History  Problem Relation Age of Onset  . Heart attack Father     Social History:  reports that he has been smoking.  He does not have any smokeless tobacco history on file. He reports that he drinks alcohol. He reports that he uses illicit drugs (Cocaine, Opium, and Marijuana).  Additional Social History:  Alcohol / Drug Use Pain Medications: No abuse reported Prescriptions: Xanax Over the Counter: No abuse reported History of alcohol / drug use?: Yes Longest period of sobriety (when/how long): Unknown Negative Consequences of Use: Financial, Personal relationships, Work / Programmer, multimedia, Armed forces operational officer Withdrawal Symptoms: Delirium, Nausea / Vomiting, Tremors, Fever / Chills, Sweats Substance #1 Name of Substance 1: Alcohol 1 - Age of First Use: 18 1 - Amount (size/oz): 12pack, 12oz 1 - Frequency: Daily-Last 45 days 1 - Duration: 45 day binge 1 - Last Use / Amount: 12/29/2014 Substance #2 Name of Substance 2: Xanax 2 - Age of First Use: 35 2 - Amount (size/oz): 7 to 8  pills. Don't know the mg 2 - Frequency: Once a month 2 - Duration: 45 day binge 2 - Last Use / Amount: 12/29/2014 Substance #3 Name of Substance 3: Cocaine 3 - Age of First Use: 40 3 - Amount (size/oz): $200 3 - Frequency: Daily 3 - Duration: 45 day binge 3 - Last Use / Amount: 12/29/2014  CIWA: CIWA-Ar BP: 114/89 mmHg Pulse Rate: 72 Nausea and Vomiting: mild nausea with no vomiting Tactile Disturbances: none Tremor: two Paroxysmal Sweats: no sweat visible Visual Disturbances: not present Anxiety: mildly anxious Headache, Fullness in Head: extremely severe Agitation: somewhat more than normal activity Orientation and Clouding of Sensorium: oriented and can do serial additions COWS:    Allergies: No Known Allergies  Home Medications:  (Not in a hospital admission)  OB/GYN Status:  No LMP for male patient.  General Assessment Data Location of Assessment: [facility] ED [facility] Assessment: In system Is this a Tele or Face-to-Face Assessment?: Face-to-Face Is this an Initial Assessment or a Re-assessment for this encounter?: Initial Assessment Marital status: Single Maiden name: n/a Is patient pregnant?: No Pregnancy Status: No Living Arrangements: Alone Can pt return to current living arrangement?: Yes Admission Status: Voluntary Is patient capable of signing voluntary admission?: Yes Referral Source: Other Insurance type: n/a  Medical Screening Exam [facility] Walk-in ONLY) Medical Exam completed: Yes  Crisis Care Plan Living Arrangements: Alone Name of Psychiatrist: None Name of Therapist: [facility], [facility]  Education Status Is patient currently in school?: No Current Grade: n/a Highest grade of school patient has completed: GED (Recently obtained GED 09/2014) Name of school: n/a Contact person: n/a  Risk to self with the past 6 months Suicidal Ideation: No Has patient been a risk to self within the past 6 months prior to admission? : No Suicidal Intent: No Has patient  had any suicidal intent within the past 6 months prior to admission? : No Is patient at risk for suicide?: No Suicidal Plan?: No Has patient had any suicidal plan within the past 6 months prior to admission? : No Access to Means: No What has been your use of drugs/alcohol within the last 12 months?: Alcohol, Cocaine & Xanax Previous Attempts/Gestures: No How many times?: 0 Other Self Harm Risks: 0 Triggers for Past Attempts: None known Intentional Self Injurious Behavior: None Family Suicide History: No Recent stressful life event(s): Other (Comment) (Active Addiction) Persecutory voices/beliefs?: No Depression: No Depression Symptoms: Guilt Substance abuse history and/or treatment for substance abuse?: Yes (Alcohol, Cocaine & Xanax) Suicide prevention information given to non-admitted patients: Not applicable  Risk to Others within the past 6 months Homicidal Ideation: No Does patient have any lifetime risk of violence toward others beyond the six months prior to admission? : No Thoughts of Harm to Others: No Current Homicidal Intent: No Current Homicidal Plan: No Access to Homicidal Means: No Identified Victim: None Reported History of harm to others?: No Assessment of Violence: None Noted Violent Behavior Description: None Reported Does patient have access to weapons?: No Criminal Charges Pending?: No Does patient have a court date: No Is patient on probation?: No  Psychosis Hallucinations: None noted Delusions: None noted  Mental Status Report Appearance/Hygiene: In hospital gown, In scrubs, Unremarkable Eye Contact: Fair Motor Activity: Unremarkable, Freedom of movement Speech: Logical/coherent, Slow Level of Consciousness: Alert Mood: Depressed, Sad, Pleasant Affect: Appropriate to circumstance, Sad, Depressed Anxiety Level: Minimal Thought Processes: Coherent, Relevant Judgement: Unimpaired Orientation: Person, Place, Time, Situation, Appropriate for  developmental age Obsessive Compulsive Thoughts/Behaviors: None  Cognitive Functioning Concentration: Normal Memory: Recent Intact, Remote Intact IQ: Average Insight: Fair Impulse Control: Poor Appetite: Fair Weight Loss: 0 Weight Gain: 0 Sleep: No Change Total Hours of Sleep: 8 Vegetative Symptoms: None  ADLScreening [facility] Assessment Services) Patient's cognitive ability adequate to safely complete daily activities?: Yes Patient able to express need for assistance with ADLs?: Yes Independently performs ADLs?: Yes (appropriate for developmental age)  Prior Inpatient Therapy Prior Inpatient Therapy: Yes Prior Therapy Dates: 09/2013 & 10/2011 Prior Therapy Facilty/Provider(s): [facility] Reason for Treatment: Substance Abuse  Prior Outpatient Therapy Prior Outpatient Therapy: No Prior Therapy Dates: Current Prior Therapy Facilty/Provider(s): [facility] Reason for Treatment: Substance Abuse Does patient have an ACCT team?: No Does patient have Intensive In-House Services?  : No Does patient have [facility] services? : No Does patient have P4CC services?: No  ADL Screening (condition at time of admission) Patient's cognitive ability adequate to safely complete daily activities?: Yes Patient able to express need for assistance with ADLs?: Yes Independently performs ADLs?: Yes (appropriate for developmental age)       Abuse/Neglect Assessment (Assessment to be complete while patient is alone) Physical Abuse: Denies Verbal Abuse: Denies Sexual Abuse: Denies Exploitation of patient/patient's resources: Denies Self-Neglect: Denies Values / Beliefs Cultural Requests During Hospitalization: None Spiritual Requests During Hospitalization: None Consults Spiritual Care Consult Needed: No Social  Work Consult Needed: No Merchant navy officerAdvance Directives (For Healthcare) Does patient have an advance directive?: No Would patient like information on creating an advanced directive?: Yes Insurance risk surveyor- Educational  materials given    Additional Information 1:1 In Past 12 Months?: No CIRT Risk: No Elopement Risk: No Does patient have medical clearance?: Yes  Child/Adolescent Assessment Running Away Risk: Denies (Patient is an adult)  Disposition:  Disposition Initial Assessment Completed for this Encounter: Yes Disposition of Patient: Referred to Patient referred to: [facility]  On Site Evaluation by:   Reviewed with Physician:    Lilyan Gilfordalvin J. Kendan Cornforth, MS, LCAS, LPC, NCC, CCSI 12/30/2014 3:17 PM

## 2014-12-30 NOTE — ED Notes (Signed)
He is requesting detox from alcohol, cocaine and xanax  Voluntary

## 2014-12-30 NOTE — ED Notes (Signed)
Supper provided along with an extra drink  Pt observed with no unusual behavior  Appropriate to stimulation  No verbalized needs or concerns at this time  NAD assessed  Continue to monitor 

## 2014-12-30 NOTE — BHH Counselor (Signed)
Received phone call from [facility], ([phone]). [facility].  Updated to ER MD (Dr. Inocencio HomesGayle) and Patient's Nurse (Amy T.).

## 2014-12-30 NOTE — ED Notes (Signed)
BEHAVIORAL HEALTH ROUNDING Patient sleeping: No. Patient alert and oriented: yes Behavior appropriate: Yes.  ; If no, describe:  Nutrition and fluids offered: yes Toileting and hygiene offered: Yes  Sitter present: q15 minute observations and security camera monitoring Law enforcement present: Yes  ODS  

## 2014-12-30 NOTE — ED Notes (Signed)

## 2014-12-30 NOTE — ED Notes (Signed)
ED BHU PLACEMENT JUSTIFICATION Is the patient under IVC or is there intent for IVC: No. Is the patient medically cleared: Yes.   Is there vacancy in the ED BHU: yes Is the population mix appropriate for patient: Yes.   Is the patient awaiting placement in inpatient or outpatient setting: No. Has the patient had a psychiatric consult: No.  TTS consult pending Survey of unit performed for contraband, proper placement and condition of furniture, tampering with fixtures in bathroom, shower, and each patient room: Yes.  ; Findings:  APPEARANCE/BEHAVIOR Calm and cooperative NEURO ASSESSMENT Orientation:  Oriented x3 Hallucinations: No.None noted (Hallucinations) Speech: Normal Gait: normal RESPIRATORY ASSESSMENT Even  unlabored respirations noted  CARDIOVASCULAR ASSESSMENT Regular rate  Pulses equal  Skin warm and dry   GASTROINTESTINAL ASSESSMENT no GI complaint EXTREMITIES Full ROM  PLAN OF CARE Provide calm/safe environment. Vital signs assessed twice daily. ED BHU Assessment once each 12-hour shift. Collaborate with intake RN daily or as condition indicates. Assure the ED provider has rounded once each shift. Provide and encourage hygiene. Provide redirection as needed. Assess for escalating behavior; address immediately and inform ED provider.  Assess family dynamic and appropriateness for visitation as needed: Yes.  ; If necessary, describe findings:  Educate the patient/family about BHU procedures/visitation: Yes.  ; If necessary, describe findings:

## 2014-12-30 NOTE — BHH Counselor (Signed)
Referral information faxed to [facility] ([phone]) and confirmed it was received. It's pending review.

## 2014-12-30 NOTE — ED Notes (Signed)
Says wants detox from etoh and cocoaine.  Says last etoh yesterday.  Says he has done cocaine for last 30 days straight.

## 2014-12-30 NOTE — ED Provider Notes (Addendum)
[facility] Emergency Department Provider Note     Time seen: ----------------------------------------- 11:42 AM on 12/30/2014 -----------------------------------------    I have reviewed the triage vital signs and the nursing notes.   HISTORY  Chief Complaint Drug / Alcohol Assessment    HPI TOUA STITES is a 59 y.o. male who presents ER for detox request from alcohol, cocaine, and Xanax. Patient states his last drink and last drug use was yesterday, was having the shakes, feeling nauseous and weak. Patient states he's tried detox before and has tried home Librium without any success. He presents requesting detox.   Past Medical History  Diagnosis Date  . Depression   . Hepatitis C   . Alcohol abuse   . Drug abuse   . Hypertension   . Thyroid disease     There are no active problems to display for this patient.   Past Surgical History  Procedure Laterality Date  . Hernia repair    . Knee surgery      right    Allergies Review of patient's allergies indicates no known allergies.  Social History Social History  Substance Use Topics  . Smoking status: Current Every Day Smoker -- 1.00 packs/day for 35 years  . Smokeless tobacco: None  . Alcohol Use: Yes     Comment: occasional    Review of Systems Constitutional: Negative for fever. Eyes: Negative for visual changes. ENT: Negative for sore throat. Cardiovascular: Negative for chest pain. Respiratory: Negative for shortness of breath. Gastrointestinal: Negative for abdominal pain, positive for nausea Genitourinary: Negative for dysuria. Musculoskeletal: Negative for back pain. Skin: Negative for rash. Neurological: Negative for headaches, positive for weakness and shaking  10-point ROS otherwise negative.  ____________________________________________   PHYSICAL EXAM:  VITAL SIGNS: ED Triage Vitals  Enc Vitals Group     BP 12/30/14 1101 114/89 mmHg     Pulse Rate  12/30/14 1101 72     Resp 12/30/14 1101 18     Temp 12/30/14 1101 97.7 F (36.5 C)     Temp Source 12/30/14 1101 Oral     SpO2 12/30/14 1101 95 %     Weight 12/30/14 1101 110 lb (49.896 kg)     Height 12/30/14 1101  (1.727 m)     Head Cir --      Peak Flow --      Pain Score --      Pain Loc --      Pain Edu? --      Excl. in GC? --     Constitutional: Alert and oriented. Mild distress Eyes: Conjunctivae are normal. PERRL. Normal extraocular movements. ENT   Head: Normocephalic and atraumatic.   Nose: No congestion/rhinnorhea.   Mouth/Throat: Mucous membranes are moist.   Neck: No stridor. Cardiovascular: Normal rate, regular rhythm. Normal and symmetric distal pulses are present in all extremities. No murmurs, rubs, or gallops. Respiratory: Normal respiratory effort without tachypnea nor retractions. Breath sounds are clear and equal bilaterally. No wheezes/rales/rhonchi. Gastrointestinal: Soft and nontender. No distention. No abdominal bruits.  Musculoskeletal: Nontender with normal range of motion in all extremities. No joint effusions.  No lower extremity tenderness nor edema. Neurologic:  Normal speech and language. No gross focal neurologic deficits are appreciated. Speech is normal. No gait instability. Tremor is noted Skin:  Skin is warm, dry and intact. No rash noted. Psychiatric: Mood and affect are normal. Speech and behavior are normal. Patient exhibits appropriate insight and judgment.  ____________________________________________  ED  COURSE:  Pertinent labs & imaging results that were available during my care of the patient were reviewed by me and considered in my medical decision making (see chart for details). Patient with polysubstance abuse, will give oral Valium and likely attempt detox placement ____________________________________________    LABS (pertinent positives/negatives)  Labs Reviewed  COMPREHENSIVE METABOLIC PANEL - Abnormal;  Notable for the following:    Glucose, Bld 135 (*)    All other components within normal limits  CBC - Abnormal; Notable for the following:    WBC 3.7 (*)    Platelets 104 (*)    All other components within normal limits  URINE DRUG SCREEN, QUALITATIVE ([facility] ONLY) - Abnormal; Notable for the following:    Cocaine Metabolite,Ur [place] POSITIVE (*)    Benzodiazepine, Ur Scrn POSITIVE (*)    All other components within normal limits  ETHANOL   ____________________________________________  FINAL ASSESSMENT AND PLAN  Polysubstance abuse, alcohol abuse  Plan: Patient with labs as dictated above. Patient is stable for detox referral medically. No acute medical conditions are identified at this time.   Emily FilbertWilliams, Mitsuye Schrodt E, MD   Emily FilbertJonathan E Cecilie Heidel, MD 12/30/14 1248  Emily FilbertJonathan E Audi Conover, MD 12/30/14 1400

## 2014-12-30 NOTE — ED Notes (Signed)
BEHAVIORAL HEALTH ROUNDING Patient sleeping: Yes.   Patient alert and oriented: eyes closed  Appears asleep Behavior appropriate: Yes.  ; If no, describe:  Nutrition and fluids offered: Yes  Toileting and hygiene offered: sleeping Sitter present: q 15 minute observations and security camera monitoring Law enforcement present: yes  ODS 

## 2014-12-30 NOTE — ED Notes (Signed)
Pt to [facility] at this time  NAD observed  1/1 bags of belongings returned to him and he verbalized that he received back all belongings that he came here with

## 2014-12-30 NOTE — ED Notes (Signed)
Pt to transfer to [facility] - i have informed him of his acceptance there and he continues to desire detox treatment

## 2014-12-30 NOTE — ED Provider Notes (Signed)
-----------------------------------------   5:12 PM on 12/30/2014 -----------------------------------------  Patient has been accepted to [facility]. Will discharge with return precautions.  Gayla DossEryka A Claramae Rigdon, MD 12/30/14 [phone]

## 2015-01-04 ENCOUNTER — Ambulatory Visit: Payer: Self-pay

## 2015-01-13 DIAGNOSIS — E440 Moderate protein-calorie malnutrition: Secondary | ICD-10-CM | POA: Insufficient documentation

## 2015-04-20 ENCOUNTER — Emergency Department
Admission: EM | Admit: 2015-04-20 | Discharge: 2015-04-20 | Disposition: A | Discharge status: Home / Self Care | Payer: PRIVATE HEALTH INSURANCE | Attending: Emergency Medicine | Admitting: Emergency Medicine

## 2015-04-20 ENCOUNTER — Encounter: Payer: Self-pay | Admitting: *Deleted

## 2015-04-20 DIAGNOSIS — F17200 Nicotine dependence, unspecified, uncomplicated: Secondary | ICD-10-CM | POA: Insufficient documentation

## 2015-04-20 DIAGNOSIS — M549 Dorsalgia, unspecified: Secondary | ICD-10-CM | POA: Insufficient documentation

## 2015-04-20 DIAGNOSIS — G8929 Other chronic pain: Secondary | ICD-10-CM | POA: Insufficient documentation

## 2015-04-20 DIAGNOSIS — F1910 Other psychoactive substance abuse, uncomplicated: Secondary | ICD-10-CM

## 2015-04-20 DIAGNOSIS — I10 Essential (primary) hypertension: Secondary | ICD-10-CM | POA: Insufficient documentation

## 2015-04-20 DIAGNOSIS — F1410 Cocaine abuse, uncomplicated: Secondary | ICD-10-CM | POA: Insufficient documentation

## 2015-04-20 DIAGNOSIS — F1010 Alcohol abuse, uncomplicated: Secondary | ICD-10-CM | POA: Insufficient documentation

## 2015-04-20 DIAGNOSIS — F1210 Cannabis abuse, uncomplicated: Secondary | ICD-10-CM | POA: Insufficient documentation

## 2015-04-20 DIAGNOSIS — F1310 Sedative, hypnotic or anxiolytic abuse, uncomplicated: Secondary | ICD-10-CM | POA: Insufficient documentation

## 2015-04-20 LAB — CBC
HEMATOCRIT: 40.4 % (ref 40.0–52.0)
Hemoglobin: 13.6 g/dL (ref 13.0–18.0)
MCH: 30.7 pg (ref 26.0–34.0)
MCHC: 33.7 g/dL (ref 32.0–36.0)
MCV: 91 fL (ref 80.0–100.0)
Platelets: 110 10*3/uL — ABNORMAL LOW (ref 150–440)
RBC: 4.45 MIL/uL (ref 4.40–5.90)
RDW: 13.6 % (ref 11.5–14.5)
WBC: 4.7 10*3/uL (ref 3.8–10.6)

## 2015-04-20 LAB — SALICYLATE LEVEL: Salicylate Lvl: 10.3 mg/dL (ref 2.8–30.0)

## 2015-04-20 LAB — COMPREHENSIVE METABOLIC PANEL
ALBUMIN: 3.9 g/dL (ref 3.5–5.0)
ALK PHOS: 72 U/L (ref 38–126)
ALT: 48 U/L (ref 17–63)
AST: 49 U/L — ABNORMAL HIGH (ref 15–41)
Anion gap: 8 (ref 5–15)
BUN: 21 mg/dL — AB (ref 6–20)
CALCIUM: 9.2 mg/dL (ref 8.9–10.3)
CO2: 26 mmol/L (ref 22–32)
CREATININE: 1.16 mg/dL (ref 0.61–1.24)
Chloride: 104 mmol/L (ref 101–111)
GFR calc Af Amer: >60 mL/min (ref >60)
GFR calc non Af Amer: >60 mL/min (ref >60)
GLUCOSE: 129 mg/dL — AB (ref 65–99)
Potassium: 3.9 mmol/L (ref 3.5–5.1)
SODIUM: 138 mmol/L (ref 135–145)
Total Bilirubin: 0.5 mg/dL (ref 0.3–1.2)
Total Protein: 7.2 g/dL (ref 6.5–8.1)

## 2015-04-20 LAB — URINE DRUG SCREEN, QUALITATIVE (ARMC ONLY)
Amphetamines, Ur Screen: NOT DETECTED
BARBITURATES, UR SCREEN: NOT DETECTED
BENZODIAZEPINE, UR SCRN: POSITIVE — AB
CANNABINOID 50 NG, UR ~~LOC~~: POSITIVE — AB
Cocaine Metabolite,Ur ~~LOC~~: POSITIVE — AB
MDMA (Ecstasy)Ur Screen: NOT DETECTED
Methadone Scn, Ur: NOT DETECTED
Opiate, Ur Screen: NOT DETECTED
PHENCYCLIDINE (PCP) UR S: NOT DETECTED
Tricyclic, Ur Screen: NOT DETECTED

## 2015-04-20 LAB — ACETAMINOPHEN LEVEL: Acetaminophen (Tylenol), Serum: <10 ug/mL — ABNORMAL LOW (ref 10–30)

## 2015-04-20 LAB — ETHANOL: Alcohol, Ethyl (B): 49 mg/dL — ABNORMAL HIGH (ref <5)

## 2015-04-20 MED ORDER — LORAZEPAM 1 MG PO TABS
1.0000 mg | ORAL_TABLET | Freq: Once | ORAL | Status: AC
  Administered 2015-04-20: 1 mg via ORAL
  Filled 2015-04-20: qty 1

## 2015-04-20 MED ORDER — TRAMADOL HCL 50 MG PO TABS
50.0000 mg | ORAL_TABLET | Freq: Once | ORAL | Status: AC
  Administered 2015-04-20: 50 mg via ORAL
  Filled 2015-04-20: qty 1

## 2015-04-20 NOTE — ED Provider Notes (Signed)
Time Seen: Approximately 2118  I have reviewed the triage notes  Chief Complaint: Behavior Problem   History of Present Illness: Juan Wood is a 60 y.o. male who presents to the emergency department request of polysubstance abuse treatment. Patient admits to recent cocaine, alcohol, and marijuana usage. He states he's also had some mid and phentermine recently. He denies any physical complaints other than some back discomfort. He states the pain is worse with movement he's had a history of chronic back discomfort. Patient denies any suicidal thoughts or hallucinations. He states he's had some thoughts of harming his son-in-law but he states he wouldn't act on it nor does he have any weapons in his possession, etc. He's been cooperative and his last alcohol consumption was approximately 2 hours prior to arrival.   Past Medical History  Diagnosis Date  . Depression   . Hepatitis C   . Alcohol abuse   . Drug abuse   . Hypertension   . Thyroid disease     There are no active problems to display for this patient.   Past Surgical History  Procedure Laterality Date  . Hernia repair    . Knee surgery      right    Past Surgical History  Procedure Laterality Date  . Hernia repair    . Knee surgery      right    No current outpatient prescriptions on file.  Allergies:  Review of patient's allergies indicates no known allergies.  Family History: Family History  Problem Relation Age of Onset  . Heart attack Father     Social History: Social History  Substance Use Topics  . Smoking status: Current Every Day Smoker -- 1.00 packs/day for 35 years  . Smokeless tobacco: None  . Alcohol Use: Yes     Comment: occasional     Review of Systems:   10 point review of systems was performed and was otherwise negative:  Constitutional: No fever Eyes: No visual disturbances ENT: No sore throat, ear pain Cardiac: No chest pain Respiratory: No shortness of breath,  wheezing, or stridor Abdomen: No abdominal pain, no vomiting, No diarrhea Endocrine: No weight loss, No night sweats Extremities: No peripheral edema, cyanosis Skin: No rashes, easy bruising Neurologic: No focal weakness, trouble with speech or swollowing Urologic: No dysuria, Hematuria, or urinary frequency   Physical Exam:  ED Triage Vitals  Enc Vitals Group     BP 04/20/15 2004 148/82 mmHg     Pulse Rate 04/20/15 2004 71     Resp 04/20/15 2004 20     Temp 04/20/15 2004 97.7 F (36.5 C)     Temp Source 04/20/15 2004 Oral     SpO2 04/20/15 2004 99 %     Weight 04/20/15 2004 135 lb (61.236 kg)     Height 04/20/15 2004  (1.753 m)     Head Cir --      Peak Flow --      Pain Score 04/20/15 2005 7     Pain Loc --      Pain Edu? --      Excl. in GC? --     General: Awake , Alert , and Oriented times 3; GCS 15 Head: Normal cephalic , atraumatic Eyes: Pupils equal , round, reactive to light Nose/Throat: No nasal drainage, patent upper airway without erythema or exudate.  Neck: Supple, Full range of motion, No anterior adenopathy or palpable thyroid masses Lungs: Clear to ascultation without wheezes ,  rhonchi, or rales Heart: Regular rate, regular rhythm without murmurs , gallops , or rubs Abdomen: Soft, non tender without rebound, guarding , or rigidity; bowel sounds positive and symmetric in all 4 quadrants. No organomegaly .        Extremities: 2 plus symmetric pulses. No edema, clubbing or cyanosis Neurologic: normal ambulation, Motor symmetric without deficits, sensory intact Skin: warm, dry, no rashes   Labs:   All laboratory work was reviewed including any pertinent negatives or positives listed below:  Labs Reviewed  COMPREHENSIVE METABOLIC PANEL - Abnormal; Notable for the following:    Glucose, Bld 129 (*)    BUN 21 (*)    AST 49 (*)    All other components within normal limits  ETHANOL - Abnormal; Notable for the following:    Alcohol, Ethyl (B) 49 (*)     All other components within normal limits  ACETAMINOPHEN LEVEL - Abnormal; Notable for the following:    Acetaminophen (Tylenol), Serum <10 (*)    All other components within normal limits  CBC - Abnormal; Notable for the following:    Platelets 110 (*)    All other components within normal limits  URINE DRUG SCREEN, QUALITATIVE ([facility] ONLY) - Abnormal; Notable for the following:    Cocaine Metabolite,Ur [place] POSITIVE (*)    Cannabinoid 50 Ng, Ur  POSITIVE (*)    Benzodiazepine, Ur Scrn POSITIVE (*)    All other components within normal limits  SALICYLATE LEVEL   positive urine drug screen cited above  ED Course:  Patient's stay here was uneventful and the patient was given Ultram for his back pain and also some Ativan for what may be some early withdrawal type symptoms. Patient I felt was medically stable to go to the detox center.   Assessment:  Polysubstance abuse Request for institutional treatment   Final Clinical Impression:   Final diagnoses:  Polysubstance abuse     Plan:  Patient was picked up by the [facility] personnel.           Jennye Moccasin, MD 04/20/15 [phone]

## 2015-04-20 NOTE — [facility] Counselor (Signed)
Pt reports to the ED requesting detox from alcohol/cocaine.  TTS consulted with [facility] Molly Maduro [phone]) regarding placement.  There is a bed available at [facility] and staff will be able to pick patient up some time this evening.

## 2015-04-20 NOTE — ED Notes (Signed)
Pt reports want "detox from alcohol and drugs." Pt states has been drinking every day for the past 2 months. Reports drinks 6-12 beers/day. States last time he drank beer was PTA and reports taking 4-5 Xanax today, states last Xanax was 12 pm today. States "I haven't been taking my medication for depression for about a month." Pt reports attempted to contact [facility] today but did not hear back from them today. Pt alert and oriented x 4, no increased work in breathing noted, skin warm and dry. Pt denies chest pain or shortness of breath. Reports having chronic back pain . Pt denies SI or HI. Will continue to monitor patient.

## 2015-04-20 NOTE — Discharge Instructions (Signed)
Polysubstance Abuse °When people abuse more than one drug or type of drug it is called polysubstance or polydrug abuse. For example, many smokers also drink alcohol. This is one form of polydrug abuse. Polydrug abuse also refers to the use of a drug to counteract an unpleasant effect produced by another drug. It may also be used to help with withdrawal from another drug. People who take stimulants may become agitated. Sometimes this agitation is countered with a tranquilizer. This helps protect against the unpleasant side effects. Polydrug abuse also refers to the use of different drugs at the same time.  °Anytime drug use is interfering with normal living activities, it has become abuse. This includes problems with family and friends. Psychological dependence has developed when your mind tells you that the drug is needed. This is usually followed by physical dependence which has developed when continuing increases of drug are required to get the same feeling or "high". This is known as addiction or chemical dependency. A person's risk is much higher if there is a history of chemical dependency in the family. °SIGNS OF CHEMICAL DEPENDENCY °· You have been told by friends or family that drugs have become a problem. °· You fight when using drugs. °· You are having blackouts (not remembering what you do while using). °· You feel sick from using drugs but continue using. °· You lie about use or amounts of drugs (chemicals) used. °· You need chemicals to get you going. °· You are suffering in work performance or in school because of drug use. °· You get sick from use of drugs but continue to use anyway. °· You need drugs to relate to people or feel comfortable in social situations. °· You use drugs to forget problems. °"Yes" answered to any of the above signs of chemical dependency indicates there are problems. The longer the use of drugs continues, the greater the problems will become. °If there is a family history of  drug or alcohol use, it is best not to experiment with these drugs. Continual use leads to tolerance. After tolerance develops more of the drug is needed to get the same feeling. This is followed by addiction. With addiction, drugs become the most important part of life. It becomes more important to take drugs than participate in the other usual activities of life. This includes relating to friends and family. Addiction is followed by dependency. Dependency is a condition where drugs are now needed not just to get high, but to feel normal. °Addiction cannot be cured but it can be stopped. This often requires outside help and the care of professionals. Treatment centers are listed in the yellow pages under: Cocaine, Narcotics, and Alcoholics Anonymous. Most hospitals and clinics can refer you to a specialized care center. Talk to your caregiver if you need help. °  °This information is not intended to replace advice given to you by your health care provider. Make sure you discuss any questions you have with your health care provider. °  °Document Released: 10/18/2004 Document Revised: 05/21/2011 Document Reviewed: 03/03/2014 °Elsevier Interactive Patient Education ©2016 [employer] ° °

## 2015-04-20 NOTE — ED Notes (Signed)

## 2015-04-20 NOTE — ED Notes (Signed)
Pt brought in by [facility].  Pt requesting detox from alcohol and drugs.  Pt reports HI.  Pt states he wants to choke/kill his soninlaw.  Pt denies SI.  Pt calm and cooperative.  Last drink was 2 hours ago.

## 2015-04-27 ENCOUNTER — Emergency Department: Payer: Self-pay

## 2015-04-27 ENCOUNTER — Encounter: Payer: Self-pay | Admitting: Emergency Medicine

## 2015-04-27 ENCOUNTER — Emergency Department
Admission: EM | Admit: 2015-04-27 | Discharge: 2015-04-27 | Disposition: A | Discharge status: Home / Self Care | Payer: Self-pay | Attending: Emergency Medicine | Admitting: Emergency Medicine

## 2015-04-27 DIAGNOSIS — R1031 Right lower quadrant pain: Secondary | ICD-10-CM | POA: Insufficient documentation

## 2015-04-27 DIAGNOSIS — F17200 Nicotine dependence, unspecified, uncomplicated: Secondary | ICD-10-CM | POA: Insufficient documentation

## 2015-04-27 DIAGNOSIS — F1310 Sedative, hypnotic or anxiolytic abuse, uncomplicated: Secondary | ICD-10-CM | POA: Insufficient documentation

## 2015-04-27 DIAGNOSIS — G8929 Other chronic pain: Secondary | ICD-10-CM | POA: Insufficient documentation

## 2015-04-27 DIAGNOSIS — R55 Syncope and collapse: Secondary | ICD-10-CM | POA: Insufficient documentation

## 2015-04-27 DIAGNOSIS — I10 Essential (primary) hypertension: Secondary | ICD-10-CM | POA: Insufficient documentation

## 2015-04-27 DIAGNOSIS — R42 Dizziness and giddiness: Secondary | ICD-10-CM | POA: Insufficient documentation

## 2015-04-27 LAB — URINE DRUG SCREEN, QUALITATIVE (ARMC ONLY)
AMPHETAMINES, UR SCREEN: NOT DETECTED
BENZODIAZEPINE, UR SCRN: POSITIVE — AB
Barbiturates, Ur Screen: NOT DETECTED
CANNABINOID 50 NG, UR ~~LOC~~: NOT DETECTED
Cocaine Metabolite,Ur ~~LOC~~: NOT DETECTED
MDMA (Ecstasy)Ur Screen: NOT DETECTED
Methadone Scn, Ur: NOT DETECTED
Opiate, Ur Screen: NOT DETECTED
PHENCYCLIDINE (PCP) UR S: NOT DETECTED
Tricyclic, Ur Screen: NOT DETECTED

## 2015-04-27 LAB — CBC
HEMATOCRIT: 40.4 % (ref 40.0–52.0)
HEMOGLOBIN: 13.5 g/dL (ref 13.0–18.0)
MCH: 30.6 pg (ref 26.0–34.0)
MCHC: 33.3 g/dL (ref 32.0–36.0)
MCV: 91.9 fL (ref 80.0–100.0)
Platelets: 115 10*3/uL — ABNORMAL LOW (ref 150–440)
RBC: 4.39 MIL/uL — ABNORMAL LOW (ref 4.40–5.90)
RDW: 13.5 % (ref 11.5–14.5)
WBC: 4 10*3/uL (ref 3.8–10.6)

## 2015-04-27 LAB — COMPREHENSIVE METABOLIC PANEL
ALBUMIN: 4.2 g/dL (ref 3.5–5.0)
ALK PHOS: 59 U/L (ref 38–126)
ALT: 68 U/L — AB (ref 17–63)
ANION GAP: 8 (ref 5–15)
AST: 63 U/L — AB (ref 15–41)
BILIRUBIN TOTAL: 0.2 mg/dL — AB (ref 0.3–1.2)
BUN: 15 mg/dL (ref 6–20)
CO2: 26 mmol/L (ref 22–32)
Calcium: 9.1 mg/dL (ref 8.9–10.3)
Chloride: 107 mmol/L (ref 101–111)
Creatinine, Ser: 0.84 mg/dL (ref 0.61–1.24)
GFR calc Af Amer: >60 mL/min (ref >60)
GFR calc non Af Amer: >60 mL/min (ref >60)
GLUCOSE: 81 mg/dL (ref 65–99)
Potassium: 3.9 mmol/L (ref 3.5–5.1)
SODIUM: 141 mmol/L (ref 135–145)
TOTAL PROTEIN: 7.4 g/dL (ref 6.5–8.1)

## 2015-04-27 LAB — URINALYSIS COMPLETE WITH MICROSCOPIC (ARMC ONLY)
Bilirubin Urine: NEGATIVE
Glucose, UA: NEGATIVE mg/dL
Hgb urine dipstick: NEGATIVE
Ketones, ur: NEGATIVE mg/dL
Leukocytes, UA: NEGATIVE
NITRITE: NEGATIVE
PROTEIN: NEGATIVE mg/dL
SPECIFIC GRAVITY, URINE: 1.009 (ref 1.005–1.030)
SQUAMOUS EPITHELIAL / LPF: NONE SEEN
WBC UA: NONE SEEN WBC/hpf (ref 0–5)
pH: 5 (ref 5.0–8.0)

## 2015-04-27 LAB — ETHANOL

## 2015-04-27 LAB — TROPONIN I: TROPONIN I: 0.03 ng/mL (ref <0.031)

## 2015-04-27 LAB — LIPASE, BLOOD: Lipase: 36 U/L (ref 11–51)

## 2015-04-27 MED ORDER — SODIUM CHLORIDE 0.9 % IV BOLUS (SEPSIS)
1000.0000 mL | Freq: Once | INTRAVENOUS | Status: AC
  Administered 2015-04-27: 1000 mL via INTRAVENOUS

## 2015-04-27 MED ORDER — IOHEXOL 240 MG/ML SOLN
25.0000 mL | Freq: Once | INTRAMUSCULAR | Status: AC | PRN
  Administered 2015-04-27: 25 mL via ORAL
  Filled 2015-04-27: qty 25

## 2015-04-27 MED ORDER — IOHEXOL 300 MG/ML  SOLN
100.0000 mL | Freq: Once | INTRAMUSCULAR | Status: AC | PRN
  Administered 2015-04-27: 100 mL via INTRAVENOUS
  Filled 2015-04-27: qty 100

## 2015-04-27 NOTE — Discharge Instructions (Signed)

## 2015-04-27 NOTE — ED Notes (Signed)
Pt to ed via [facility] with reports of possible syncope episode today. Pt c/o dizziness and abd pain.

## 2015-04-27 NOTE — ED Notes (Signed)
Patient observed raising arm rail of w/c and sitting in the floor. Patient proceeded to roll about the floor. This RN and visitor to chairside and patient assisted back into chair. Patient denies new injury - states, "My damn belly hurts. I keep getting dizzy and passing out. You people have me sitting in a damn chair and I need to be in a bed somewhere." Arm rail of chair replaced by this RN - patient left in chair and RN went to speak with charge nurse. Prior to leaving, registration staff asked to watch patient for further behaviors. Spoke with charge to make her aware of behavior. Advised to remove patient from lobby and place him in between triage 1 and 2 - have triage staff intermittently check on patient. RN back to lobby. Visitor that initially assisted this RN in getting patient back into chair reported that they watched patient raise the rail and sit in the floor and proceed to roll - states, "He did not fall. He sat down and rolled in the floor. There is no way he hurt himself." Charge nurse made aware.

## 2015-04-27 NOTE — ED Provider Notes (Addendum)
[facility] Emergency Department Provider Note  ____________________________________________   I have reviewed the triage vital signs and the nursing notes.   HISTORY  Chief Complaint Abdominal Pain and Near Syncope    HPI Juan Wood is a 60 y.o. male unknown to [place] recent visits for alcohol, cocaine and marijuana abuse. He is also tried methamphetamine according to the notes. Patient states that he went through detox recently is no longer drinking alcohol but he sometimes gets lightheaded. He is eating and drinking well. He denies a chest pain shortness of breath or vomiting. He states that he has chronic right lower quadrant abdominal pain where his hernia is. He has had 2 hernia repairs in the past. He states that when he walks around sometimes the pain seems to get bad. He denies any melena bright red blood per rectum or diarrhea. For some reason, patient was rolling around on the floor in the triage booth. Once he got into a bed, he seemed much more calm. Patient presents with his luggage.  Past Medical History  Diagnosis Date  . Depression   . Hepatitis C   . Alcohol abuse   . Drug abuse   . Hypertension   . Thyroid disease     There are no active problems to display for this patient.   Past Surgical History  Procedure Laterality Date  . Hernia repair    . Knee surgery      right    No current outpatient prescriptions on file.  Allergies Review of patient's allergies indicates no known allergies.  Family History  Problem Relation Age of Onset  . Heart attack Father     Social History Social History  Substance Use Topics  . Smoking status: Current Every Day Smoker -- 1.00 packs/day for 35 years  . Smokeless tobacco: None  . Alcohol Use: Yes     Comment: occasional    Review of Systems Constitutional: No fever/chills Eyes: No visual changes. ENT: No sore throat. No stiff neck no neck  pain Cardiovascular: Denies chest pain. Respiratory: Denies shortness of breath. Gastrointestinal:   no vomiting.  No diarrhea.  No constipation. Genitourinary: Negative for dysuria. Musculoskeletal: Negative lower extremity swelling Skin: Negative for rash. Neurological: Negative for headaches, focal weakness or numbness. 10-point ROS otherwise negative.  ____________________________________________   PHYSICAL EXAM:  VITAL SIGNS: ED Triage Vitals  Enc Vitals Group     BP 04/27/15 1828 141/81 mmHg     Pulse Rate 04/27/15 1828 79     Resp 04/27/15 1828 20     Temp 04/27/15 1828 98.1 F (36.7 C)     Temp Source 04/27/15 1828 Oral     SpO2 04/27/15 1828 98 %     Weight 04/27/15 1828 135 lb (61.236 kg)     Height 04/27/15 1828 5\' 8"  (1.727 m)     Head Cir --      Peak Flow --      Pain Score 04/27/15 1828 8     Pain Loc --      Pain Edu? --      Excl. in GC? --     Constitutional: Alert and oriented. Well appearing and in no acute distress. Eyes: Conjunctivae are normal. PERRL. EOMI. Head: Atraumatic. Nose: No congestion/rhinnorhea. Mouth/Throat: Mucous membranes are moist.  Oropharynx non-erythematous. Neck: No stridor.   Nontender with no meningismus Cardiovascular: Normal rate, regular rhythm. Grossly normal heart sounds.  Good peripheral circulation. Respiratory: Normal respiratory effort.  No retractions. Lungs CTAB. Abdominal:Soft there is tenderness to palpation mildly at the inguinal region on the right with no masses or swelling or distention. No guarding no rebound Back:  There is no focal tenderness or step off there is no midline tenderness there are no lesions noted. there is no CVA tenderness Normal external male genitalia no obvious mass or lesion no hernia noted  Musculoskeletal: No lower extremity tenderness. No joint effusions, no DVT signs strong distal pulses no edema Neurologic:  Normal speech and language. No gross focal neurologic deficits are  appreciated.  Skin:  Skin is warm, dry and intact. No rash noted. Psychiatric: Mood and affect aresomewhat anxious. Speech and behavior are normal.  ____________________________________________   LABS (all labs ordered are listed, but only abnormal results are displayed)  Labs Reviewed  COMPREHENSIVE METABOLIC PANEL - Abnormal; Notable for the following:    AST 63 (*)    ALT 68 (*)    Total Bilirubin 0.2 (*)    All other components within normal limits  CBC - Abnormal; Notable for the following:    RBC 4.39 (*)    Platelets 115 (*)    All other components within normal limits  URINALYSIS COMPLETEWITH MICROSCOPIC ([facility] ONLY) - Abnormal; Notable for the following:    Color, Urine STRAW (*)    APPearance CLEAR (*)    Bacteria, UA RARE (*)    All other components within normal limits  LIPASE, BLOOD  ETHANOL  TROPONIN I  URINE DRUG SCREEN, QUALITATIVE ([facility] ONLY)   ____________________________________________  EKG  I personally interpreted any EKGs ordered by me or triage Normal sinus rhythm rate 60 bpm, LAD noted, likely repolarization abnormality noted..  ____________________________________________  RADIOLOGY  I reviewed any imaging ordered by me or triage that were performed during my shift ____________________________________________   PROCEDURES  Procedure(s) performed: None  Critical Care performed: None  ____________________________________________   INITIAL IMPRESSION / ASSESSMENT AND PLAN / ED COURSE  Pertinent labs & imaging results that were available during my care of the patient were reviewed by me and considered in my medical decision making (see chart for details).  Patient presents today with a complaint of being lightheaded as well as abdominal pain. Seems also to have elected to roll around on the floor in the waiting room for some reason. Given his recent history of narcotic abuse I will not give him narcotics. However, we did do an extensive  and covering workup on this patient. For his syncope and did EKG which doesn't show any acute ischemic changes or dysrhythmia more to the point. Even though he has no chest pain I did do a troponin which is negative. I did check a urine which is normal. I checked an alcohol which is negative. Vital signs are normal. CT of the abdomen and pelvis shows no evidence of acute herniation. There is no evidence of incarcerated hernia clinically. Patient was hydrated with IV fluid and his by mouth intake is  very good given his history. There is no evidence of acute pathology found on any results or his vital signs or his exam. He is a chronic mild elevation of his liver function test but that is not different from normal and is much less than it has been in the past. He has no right upper quadrant pain and CT is normal. Does not have an elevated white count is not anemic, his platelet count slightly low but again this is chronic for him and not different from  normal and there is no evidence of bleeding. Electrolytes are within normal limits kidney function is normal. No evidence of urinary tract infection. There is no indication for acute admission I will refer him to surgery for his chronic recurrent abdominal pain which she states is been there for years. Patient has no SI or HI.   ----------------------------------------- 9:45 PM on 04/27/2015 -----------------------------------------  Ration betray is no evidence of acute pathology today but he is very angry and upset that I'm not giving him narcotic pain medication.  ----------------------------------------- 9:53 PM on 04/27/2015 -----------------------------------------  Patient cursing and swearing refused to sign paperwork was angry and upset. ____________________________________________   FINAL CLINICAL IMPRESSION(S) / ED DIAGNOSES  Final diagnoses:  Lightheadedness  Right lower quadrant abdominal pain      This chart was dictated using  voice recognition software.  Despite best efforts to proofread,  errors can occur which can change meaning.     Jeanmarie Plant, MD 04/27/15 2130  Jeanmarie Plant, MD 04/27/15 4098  Jeanmarie Plant, MD 04/27/15 1191  Jeanmarie Plant, MD 04/27/15 2153

## 2015-04-28 ENCOUNTER — Emergency Department
Admission: EM | Admit: 2015-04-28 | Discharge: 2015-04-28 | Disposition: A | Discharge status: Home / Self Care | Payer: PRIVATE HEALTH INSURANCE | Attending: Emergency Medicine | Admitting: Emergency Medicine

## 2015-04-28 ENCOUNTER — Encounter: Payer: Self-pay | Admitting: Emergency Medicine

## 2015-04-28 DIAGNOSIS — M549 Dorsalgia, unspecified: Secondary | ICD-10-CM | POA: Insufficient documentation

## 2015-04-28 DIAGNOSIS — I10 Essential (primary) hypertension: Secondary | ICD-10-CM | POA: Insufficient documentation

## 2015-04-28 DIAGNOSIS — F17200 Nicotine dependence, unspecified, uncomplicated: Secondary | ICD-10-CM | POA: Insufficient documentation

## 2015-04-28 DIAGNOSIS — G8929 Other chronic pain: Secondary | ICD-10-CM | POA: Insufficient documentation

## 2015-04-28 MED ORDER — KETOROLAC TROMETHAMINE 60 MG/2ML IM SOLN
60.0000 mg | Freq: Once | INTRAMUSCULAR | Status: AC
  Administered 2015-04-28: 60 mg via INTRAMUSCULAR
  Filled 2015-04-28: qty 2

## 2015-04-28 NOTE — ED Notes (Addendum)
Pt to triage via w/c with no distress noted, brought in by EMS; pt reports lower back pain for "years" with no known cause; st previous dx of degenerative disc disease and needs surgery; pt reports being here earlier for hernia

## 2015-04-28 NOTE — ED Provider Notes (Signed)
[facility] Emergency Department Provider Note  ____________________________________________  Time seen: 4:15 AM  I have reviewed the triage vital signs and the nursing notes.   HISTORY  Chief Complaint Back Pain     HPI Juan Wood is a 60 y.o. male with history of alcohol cocaine and marijuana abuse presents via EMS with "back pain for years". Patient admits to low back pain intermittently times years current pain score 10 out of 10. Patient denies any lower extremity weakness or numbness no gait instability. Of note patient recently seen in the emergency department yesterday for chronic abdominal pain secondary to her hernia that he's had for years. After reviewing the chart patient left the emergency department quite irate when not given any narcotic pain medication.   Past Medical History  Diagnosis Date  . Depression   . Hepatitis C   . Alcohol abuse   . Drug abuse   . Hypertension   . Thyroid disease     There are no active problems to display for this patient.   Past Surgical History  Procedure Laterality Date  . Hernia repair    . Knee surgery      right    No current outpatient prescriptions on file.  Allergies Review of patient's allergies indicates no known allergies.  Family History  Problem Relation Age of Onset  . Heart attack Father     Social History Social History  Substance Use Topics  . Smoking status: Current Every Day Smoker -- 1.00 packs/day for 35 years  . Smokeless tobacco: None  . Alcohol Use: Yes     Comment: occasional    Review of Systems  Constitutional: Negative for fever. Eyes: Negative for visual changes. ENT: Negative for sore throat. Cardiovascular: Negative for chest pain. Respiratory: Negative for shortness of breath. Gastrointestinal: Negative for abdominal pain, vomiting and diarrhea. Genitourinary: Negative for dysuria. Musculoskeletal: Positive for back pain. Skin: Negative for  rash. Neurological: Negative for headaches, focal weakness or numbness.   10-point ROS otherwise negative.  ____________________________________________   PHYSICAL EXAM:  VITAL SIGNS: ED Triage Vitals  Enc Vitals Group     BP 04/28/15 0239 143/80 mmHg     Pulse Rate 04/28/15 0239 80     Resp 04/28/15 0239 20     Temp 04/28/15 0239 97.9 F (36.6 C)     Temp Source 04/28/15 0239 Oral     SpO2 04/28/15 0239 97 %     Weight 04/28/15 0239 135 lb (61.236 kg)     Height 04/28/15 0239  (1.727 m)     Head Cir --      Peak Flow --      Pain Score 04/28/15 0239 9     Pain Loc --      Pain Edu? --      Excl. in GC? --      Constitutional: Alert and oriented. Well appearing and in no distress. Eyes: Conjunctivae are normal. PERRL. Normal extraocular movements. ENT   Head: Normocephalic and atraumatic.   Nose: No congestion/rhinnorhea.   Mouth/Throat: Mucous membranes are moist.   Neck: No stridor. Hematological/Lymphatic/Immunilogical: No cervical lymphadenopathy. Cardiovascular: Normal rate, regular rhythm. Normal and symmetric distal pulses are present in all extremities. No murmurs, rubs, or gallops. Respiratory: Normal respiratory effort without tachypnea nor retractions. Breath sounds are clear and equal bilaterally. No wheezes/rales/rhonchi. Gastrointestinal: Soft and nontender. No distention. There is no CVA tenderness. Genitourinary: deferred Musculoskeletal: Nontender with normal range of motion in  all extremities. No joint effusions.  No lower extremity tenderness nor edema. Neurologic:  Normal speech and language. No gross focal neurologic deficits are appreciated. Speech is normal.  Skin:  Skin is warm, dry and intact. No rash noted. Psychiatric: Mood and affect are normal. Speech and behavior are normal. Patient exhibits appropriate insight and judgment.     INITIAL IMPRESSION / ASSESSMENT AND PLAN / ED COURSE  Pertinent labs & imaging results  that were available during my care of the patient were reviewed by me and considered in my medical decision making (see chart for details).  Patient received IM Toradol 60 mg and will be discharged  ____________________________________________   FINAL CLINICAL IMPRESSION(S) / ED DIAGNOSES  Final diagnoses:  Chronic back pain      Darci Current, MD 04/28/15 [phone]

## 2015-04-28 NOTE — ED Notes (Signed)
Dr Manson Passey in triage 1 to assess pt

## 2015-04-28 NOTE — Discharge Instructions (Signed)

## 2015-05-22 ENCOUNTER — Emergency Department
Admission: EM | Admit: 2015-05-22 | Discharge: 2015-05-23 | Disposition: A | Discharge status: Other Acute Inpatient Hospital | Payer: PRIVATE HEALTH INSURANCE | Attending: Emergency Medicine | Admitting: Emergency Medicine

## 2015-05-22 DIAGNOSIS — F17200 Nicotine dependence, unspecified, uncomplicated: Secondary | ICD-10-CM | POA: Diagnosis present

## 2015-05-22 DIAGNOSIS — I10 Essential (primary) hypertension: Secondary | ICD-10-CM | POA: Insufficient documentation

## 2015-05-22 DIAGNOSIS — F1310 Sedative, hypnotic or anxiolytic abuse, uncomplicated: Secondary | ICD-10-CM | POA: Diagnosis present

## 2015-05-22 DIAGNOSIS — F32A Depression, unspecified: Secondary | ICD-10-CM

## 2015-05-22 DIAGNOSIS — K219 Gastro-esophageal reflux disease without esophagitis: Secondary | ICD-10-CM | POA: Diagnosis present

## 2015-05-22 DIAGNOSIS — R45851 Suicidal ideations: Secondary | ICD-10-CM

## 2015-05-22 DIAGNOSIS — E039 Hypothyroidism, unspecified: Secondary | ICD-10-CM | POA: Diagnosis present

## 2015-05-22 DIAGNOSIS — F329 Major depressive disorder, single episode, unspecified: Secondary | ICD-10-CM | POA: Insufficient documentation

## 2015-05-22 DIAGNOSIS — F1010 Alcohol abuse, uncomplicated: Secondary | ICD-10-CM | POA: Insufficient documentation

## 2015-05-22 LAB — CBC
HEMATOCRIT: 45 % (ref 40.0–52.0)
HEMOGLOBIN: 15.1 g/dL (ref 13.0–18.0)
MCH: 30.5 pg (ref 26.0–34.0)
MCHC: 33.6 g/dL (ref 32.0–36.0)
MCV: 90.7 fL (ref 80.0–100.0)
Platelets: 128 10*3/uL — ABNORMAL LOW (ref 150–440)
RBC: 4.96 MIL/uL (ref 4.40–5.90)
RDW: 13.8 % (ref 11.5–14.5)
WBC: 6 10*3/uL (ref 3.8–10.6)

## 2015-05-22 LAB — URINE DRUG SCREEN, QUALITATIVE (ARMC ONLY)
Amphetamines, Ur Screen: NOT DETECTED
BARBITURATES, UR SCREEN: NOT DETECTED
BENZODIAZEPINE, UR SCRN: POSITIVE — AB
CANNABINOID 50 NG, UR ~~LOC~~: NOT DETECTED
COCAINE METABOLITE, UR ~~LOC~~: NOT DETECTED
MDMA (Ecstasy)Ur Screen: NOT DETECTED
Methadone Scn, Ur: NOT DETECTED
OPIATE, UR SCREEN: NOT DETECTED
PHENCYCLIDINE (PCP) UR S: NOT DETECTED
Tricyclic, Ur Screen: NOT DETECTED

## 2015-05-22 LAB — COMPREHENSIVE METABOLIC PANEL
ALBUMIN: 4.3 g/dL (ref 3.5–5.0)
ALT: 86 U/L — AB (ref 17–63)
AST: 87 U/L — AB (ref 15–41)
Alkaline Phosphatase: 91 U/L (ref 38–126)
Anion gap: 11 (ref 5–15)
BILIRUBIN TOTAL: 1 mg/dL (ref 0.3–1.2)
BUN: 22 mg/dL — AB (ref 6–20)
CHLORIDE: 96 mmol/L — AB (ref 101–111)
CO2: 25 mmol/L (ref 22–32)
CREATININE: 1.02 mg/dL (ref 0.61–1.24)
Calcium: 9.3 mg/dL (ref 8.9–10.3)
GFR calc Af Amer: >60 mL/min (ref >60)
GLUCOSE: 233 mg/dL — AB (ref 65–99)
POTASSIUM: 4.1 mmol/L (ref 3.5–5.1)
Sodium: 132 mmol/L — ABNORMAL LOW (ref 135–145)
Total Protein: 8 g/dL (ref 6.5–8.1)

## 2015-05-22 LAB — ETHANOL: Alcohol, Ethyl (B): <5 mg/dL (ref <5)

## 2015-05-22 MED ORDER — THIAMINE HCL 100 MG/ML IJ SOLN: 100.0000 mg | Freq: Every day | INTRAMUSCULAR | Status: DC

## 2015-05-22 MED ORDER — LORAZEPAM 2 MG PO TABS: 0.0000 mg | ORAL_TABLET | Freq: Two times a day (BID) | ORAL | Status: DC

## 2015-05-22 MED ORDER — VITAMIN B-1 100 MG PO TABS
100.0000 mg | ORAL_TABLET | Freq: Every day | ORAL | Status: DC
  Administered 2015-05-23 (×2): 100 mg via ORAL
  Filled 2015-05-22 (×2): qty 1

## 2015-05-22 MED ORDER — LORAZEPAM 2 MG PO TABS
0.0000 mg | ORAL_TABLET | Freq: Four times a day (QID) | ORAL | Status: DC
  Administered 2015-05-22 – 2015-05-23 (×3): 1 mg via ORAL
  Administered 2015-05-23: 2 mg via ORAL
  Filled 2015-05-22 (×2): qty 1

## 2015-05-22 NOTE — ED Notes (Signed)

## 2015-05-22 NOTE — ED Notes (Signed)
Last drank this am - awaiting etoh level. Calm at this time with vss

## 2015-05-22 NOTE — ED Notes (Signed)
ED BHU PLACEMENT JUSTIFICATION Is the patient under IVC or is there intent for IVC: No. Is the patient medically cleared: Yes.   Is there vacancy in the ED BHU: Yes.   Is the population mix appropriate for patient: Yes.   Is the patient awaiting placement in inpatient or outpatient setting: Yes.   Has the patient had a psychiatric consult: No. Survey of unit performed for contraband, proper placement and condition of furniture, tampering with fixtures in bathroom, shower, and each patient room: Yes.  ; Findings:  APPEARANCE/BEHAVIOR calm, cooperative and adequate rapport can be established NEURO ASSESSMENT Orientation: time, place and person Hallucinations: No.None noted (Hallucinations) Speech: Normal Gait: normal RESPIRATORY ASSESSMENT Normal expansion.  Clear to auscultation.  No rales, rhonchi, or wheezing. CARDIOVASCULAR ASSESSMENT regular rate and rhythm, S1, S2 normal, no murmur, click, rub or gallop GASTROINTESTINAL ASSESSMENT soft, nontender, BS WNL, no r/g EXTREMITIES normal strength, tone, and muscle mass PLAN OF CARE Provide calm/safe environment. Vital signs assessed twice daily. ED BHU Assessment once each 12-hour shift. Collaborate with intake RN daily or as condition indicates. Assure the ED provider has rounded once each shift. Provide and encourage hygiene. Provide redirection as needed. Assess for escalating behavior; address immediately and inform ED provider.  Assess family dynamic and appropriateness for visitation as needed: Yes.  ; If necessary, describe findings:  Educate the patient/family about BHU procedures/visitation: Yes.  ; If necessary, describe findings:  

## 2015-05-22 NOTE — ED Notes (Signed)
Pt pocket knife in belonging bag.

## 2015-05-22 NOTE — ED Notes (Signed)
Pt drink a six back of beer per day, last drink at 230AM today.

## 2015-05-22 NOTE — ED Notes (Signed)
BEHAVIORAL HEALTH ROUNDING  Patient sleeping: Yes.  Patient alert and oriented: no  Behavior appropriate: Yes. ; If no, describe:  Nutrition and fluids offered: No  Toileting and hygiene offered: No  Sitter present: no  Law enforcement present: Yes   

## 2015-05-22 NOTE — ED Provider Notes (Signed)
[facility] Emergency Department Provider Note     Time seen: ----------------------------------------- 5:22 PM on 05/22/2015 -----------------------------------------    I have reviewed the triage vital signs and the nursing notes.   HISTORY  Chief Complaint Suicidal    HPI Juan Wood is a 60 y.o. male who presents ER for suicidal thoughts. Patient states he was thinking about walking in front of a car today, his wife was worried that he was going to kill himself. Patient presents wanting help for alcohol and suicidal thought. Last drink of alcohol was 2:30 this morning. Patient is feeling depressed.   Past Medical History  Diagnosis Date  . Depression   . Hepatitis C   . Alcohol abuse   . Drug abuse   . Hypertension   . Thyroid disease     There are no active problems to display for this patient.   Past Surgical History  Procedure Laterality Date  . Hernia repair    . Knee surgery      right    Allergies Review of patient's allergies indicates no known allergies.  Social History Social History  Substance Use Topics  . Smoking status: Current Every Day Smoker -- 1.00 packs/day for 35 years  . Smokeless tobacco: None  . Alcohol Use: Yes     Comment: occasional    Review of Systems Constitutional: Negative for fever. Eyes: Negative for visual changes. ENT: Negative for sore throat. Cardiovascular: Negative for chest pain. Respiratory: Negative for shortness of breath. Gastrointestinal: Negative for abdominal pain, vomiting and diarrhea. Genitourinary: Negative for dysuria. Musculoskeletal: Negative for back pain. Skin: Negative for rash. Neurological: Negative for headaches, focal weakness or numbness. Psychiatric: Positive for suicidal ideation, alcohol abuse 10-point ROS otherwise negative.  ____________________________________________   PHYSICAL EXAM:  VITAL SIGNS: ED Triage Vitals  Enc Vitals Group     BP  05/22/15 1647 165/91 mmHg     Pulse Rate 05/22/15 1647 103     Resp 05/22/15 1647 22     Temp 05/22/15 1650 98.3 F (36.8 C)     Temp Source 05/22/15 1650 Oral     SpO2 05/22/15 1647 98 %     Weight 05/22/15 1647 135 lb (61.236 kg)     Height --      Head Cir --      Peak Flow --      Pain Score 05/22/15 1648 0     Pain Loc --      Pain Edu? --      Excl. in GC? --     Constitutional: Alert and oriented. Well appearing and in no distress. Eyes: Conjunctivae are normal. PERRL. Normal extraocular movements. ENT   Head: Normocephalic and atraumatic.   Nose: No congestion/rhinnorhea.   Mouth/Throat: Mucous membranes are moist.   Neck: No stridor. Cardiovascular: Normal rate, regular rhythm. Normal and symmetric distal pulses are present in all extremities. No murmurs, rubs, or gallops. Respiratory: Normal respiratory effort without tachypnea nor retractions. Breath sounds are clear and equal bilaterally. No wheezes/rales/rhonchi. Gastrointestinal: Soft and nontender. No distention. No abdominal bruits.  Musculoskeletal: Nontender with normal range of motion in all extremities. No joint effusions.  No lower extremity tenderness nor edema. Neurologic:  Normal speech and language. No gross focal neurologic deficits are appreciated. Speech is normal. No gait instability. Skin:  Skin is warm, dry and intact. No rash noted. Psychiatric: Depressed mood and affect, active suicidal thoughts. ____________________________________________  ED COURSE:  Pertinent labs & imaging results  that were available during my care of the patient were reviewed by me and considered in my medical decision making (see chart for details). Patient is in no acute distress, will check basic labs and consult psychiatry. ____________________________________________    LABS (pertinent positives/negatives)  Labs Reviewed  COMPREHENSIVE METABOLIC PANEL - Abnormal; Notable for the following:    Sodium  132 (*)    Chloride 96 (*)    Glucose, Bld 233 (*)    BUN 22 (*)    AST 87 (*)    ALT 86 (*)    All other components within normal limits  CBC - Abnormal; Notable for the following:    Platelets 128 (*)    All other components within normal limits  ETHANOL  URINE DRUG SCREEN, QUALITATIVE ([facility] ONLY)   ____________________________________________  FINAL ASSESSMENT AND PLAN  Depression, suicidal ideation, alcohol abuse  Plan: Patient with labs as dictated above. Patient is medically stable for psychiatric evaluation. He is here voluntarily, does not need involuntary commitment at this time.   Emily Filbert, MD   Emily Filbert, MD 05/22/15 [phone]

## 2015-05-22 NOTE — ED Notes (Signed)
Pt to ER via wife c/o suicidal thoughts intermittently X 6 months. Pt states that alcohol and "tired of what is going on, my whole life has took a downhill". Pt denies hx of attempt but does have plans to walk out in front of a car "if I don't get some help".

## 2015-05-22 NOTE — ED Notes (Signed)
Got out of detox on 2/15 - started drinking again. States depressed, thoughts of suicide (to walk out into traffic) and worried about withdrawals.
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
## 2015-05-22 NOTE — ED Notes (Signed)
Report called to Matt, RN in ED BHU 

## 2015-05-22 NOTE — ED Notes (Signed)
Patient assigned to appropriate care area. Patient oriented to unit/care area: Informed that, for their safety, care areas are designed for safety and monitored by security cameras at all times; and visiting hours explained to patient. Patient verbalizes understanding, and verbal contract for safety obtained. 

## 2015-05-23 ENCOUNTER — Inpatient Hospital Stay: Admit: 2015-05-23 | Payer: Self-pay | Admitting: Psychiatry

## 2015-05-23 ENCOUNTER — Encounter: Payer: Self-pay | Admitting: Psychiatry

## 2015-05-23 ENCOUNTER — Inpatient Hospital Stay
Admission: AD | Admit: 2015-05-23 | Discharge: 2015-05-26 | DRG: 885 | Disposition: A | Discharge status: Home / Self Care | Payer: No Typology Code available for payment source | Source: Intra-hospital | Attending: Psychiatry | Admitting: Psychiatry

## 2015-05-23 DIAGNOSIS — F1310 Sedative, hypnotic or anxiolytic abuse, uncomplicated: Secondary | ICD-10-CM | POA: Diagnosis present

## 2015-05-23 DIAGNOSIS — F17200 Nicotine dependence, unspecified, uncomplicated: Secondary | ICD-10-CM | POA: Diagnosis present

## 2015-05-23 DIAGNOSIS — K219 Gastro-esophageal reflux disease without esophagitis: Secondary | ICD-10-CM | POA: Diagnosis present

## 2015-05-23 DIAGNOSIS — G4700 Insomnia, unspecified: Secondary | ICD-10-CM | POA: Diagnosis present

## 2015-05-23 DIAGNOSIS — F419 Anxiety disorder, unspecified: Secondary | ICD-10-CM | POA: Diagnosis present

## 2015-05-23 DIAGNOSIS — R627 Adult failure to thrive: Secondary | ICD-10-CM | POA: Diagnosis present

## 2015-05-23 DIAGNOSIS — Z590 Homelessness: Secondary | ICD-10-CM

## 2015-05-23 DIAGNOSIS — Z9889 Other specified postprocedural states: Secondary | ICD-10-CM | POA: Diagnosis not present

## 2015-05-23 DIAGNOSIS — I7300 Raynaud's syndrome without gangrene: Secondary | ICD-10-CM | POA: Diagnosis present

## 2015-05-23 DIAGNOSIS — R251 Tremor, unspecified: Secondary | ICD-10-CM | POA: Diagnosis present

## 2015-05-23 DIAGNOSIS — Z560 Unemployment, unspecified: Secondary | ICD-10-CM | POA: Diagnosis not present

## 2015-05-23 DIAGNOSIS — E039 Hypothyroidism, unspecified: Secondary | ICD-10-CM | POA: Diagnosis present

## 2015-05-23 DIAGNOSIS — F10129 Alcohol abuse with intoxication, unspecified: Secondary | ICD-10-CM | POA: Diagnosis present

## 2015-05-23 DIAGNOSIS — I10 Essential (primary) hypertension: Secondary | ICD-10-CM | POA: Diagnosis present

## 2015-05-23 DIAGNOSIS — F332 Major depressive disorder, recurrent severe without psychotic features: Secondary | ICD-10-CM | POA: Diagnosis present

## 2015-05-23 DIAGNOSIS — B1920 Unspecified viral hepatitis C without hepatic coma: Secondary | ICD-10-CM | POA: Diagnosis present

## 2015-05-23 DIAGNOSIS — R45851 Suicidal ideations: Secondary | ICD-10-CM | POA: Diagnosis present

## 2015-05-23 DIAGNOSIS — F17210 Nicotine dependence, cigarettes, uncomplicated: Secondary | ICD-10-CM | POA: Diagnosis present

## 2015-05-23 DIAGNOSIS — F322 Major depressive disorder, single episode, severe without psychotic features: Secondary | ICD-10-CM

## 2015-05-23 DIAGNOSIS — F1024 Alcohol dependence with alcohol-induced mood disorder: Secondary | ICD-10-CM | POA: Diagnosis present

## 2015-05-23 DIAGNOSIS — Z8249 Family history of ischemic heart disease and other diseases of the circulatory system: Secondary | ICD-10-CM | POA: Diagnosis not present

## 2015-05-23 DIAGNOSIS — F1010 Alcohol abuse, uncomplicated: Secondary | ICD-10-CM

## 2015-05-23 LAB — TSH: TSH: 1.883 u[IU]/mL (ref 0.350–4.500)

## 2015-05-23 MED ORDER — CHLORDIAZEPOXIDE HCL 25 MG PO CAPS
25.0000 mg | ORAL_CAPSULE | Freq: Four times a day (QID) | ORAL | Status: DC
  Administered 2015-05-23: 25 mg via ORAL
  Filled 2015-05-23: qty 1

## 2015-05-23 MED ORDER — IBUPROFEN 600 MG PO TABS
600.0000 mg | ORAL_TABLET | Freq: Four times a day (QID) | ORAL | Status: DC | PRN
  Administered 2015-05-23 – 2015-05-24 (×3): 600 mg via ORAL
  Filled 2015-05-23 (×3): qty 1

## 2015-05-23 MED ORDER — AMLODIPINE BESYLATE 5 MG PO TABS
5.0000 mg | ORAL_TABLET | Freq: Every day | ORAL | Status: DC
  Administered 2015-05-23 – 2015-05-26 (×4): 5 mg via ORAL
  Filled 2015-05-23 (×5): qty 1

## 2015-05-23 MED ORDER — PANTOPRAZOLE SODIUM 40 MG PO TBEC
40.0000 mg | DELAYED_RELEASE_TABLET | Freq: Every day | ORAL | Status: DC
  Administered 2015-05-23 – 2015-05-26 (×4): 40 mg via ORAL
  Filled 2015-05-23 (×4): qty 1

## 2015-05-23 MED ORDER — INFLUENZA VAC SPLIT QUAD 0.5 ML IM SUSY
0.5000 mL | PREFILLED_SYRINGE | INTRAMUSCULAR | Status: AC
  Administered 2015-05-24: 0.5 mL via INTRAMUSCULAR
  Filled 2015-05-23: qty 0.5

## 2015-05-23 MED ORDER — FLUOXETINE HCL 20 MG PO CAPS
40.0000 mg | ORAL_CAPSULE | Freq: Every day | ORAL | Status: DC
  Administered 2015-05-24 – 2015-05-26 (×3): 40 mg via ORAL
  Filled 2015-05-23 (×3): qty 2

## 2015-05-23 MED ORDER — TRAZODONE HCL 100 MG PO TABS
100.0000 mg | ORAL_TABLET | Freq: Every day | ORAL | Status: DC
  Administered 2015-05-23 – 2015-05-25 (×3): 100 mg via ORAL
  Filled 2015-05-23 (×3): qty 1

## 2015-05-23 MED ORDER — ALUM & MAG HYDROXIDE-SIMETH 200-200-20 MG/5ML PO SUSP
30.0000 mL | ORAL | Status: DC | PRN
  Filled 2015-05-23: qty 30

## 2015-05-23 MED ORDER — QUETIAPINE FUMARATE 25 MG PO TABS: 50.0000 mg | ORAL_TABLET | Freq: Every day | ORAL | Status: DC

## 2015-05-23 MED ORDER — LORAZEPAM 1 MG PO TABS
ORAL_TABLET | ORAL | Status: AC
  Filled 2015-05-23: qty 1

## 2015-05-23 MED ORDER — ONDANSETRON 8 MG PO TBDP
4.0000 mg | ORAL_TABLET | Freq: Once | ORAL | Status: AC
  Administered 2015-05-23: 4 mg via ORAL
  Filled 2015-05-23: qty 0.5

## 2015-05-23 MED ORDER — LEVOTHYROXINE SODIUM 25 MCG PO TABS
25.0000 ug | ORAL_TABLET | Freq: Every day | ORAL | Status: DC
  Administered 2015-05-24 – 2015-05-26 (×3): 25 ug via ORAL
  Filled 2015-05-23 (×5): qty 1

## 2015-05-23 MED ORDER — MAGNESIUM HYDROXIDE 400 MG/5ML PO SUSP: 30.0000 mL | Freq: Every day | ORAL | Status: DC | PRN

## 2015-05-23 MED ORDER — ONDANSETRON 4 MG PO TBDP
ORAL_TABLET | ORAL | Status: AC
  Filled 2015-05-23: qty 1

## 2015-05-23 MED ORDER — LORAZEPAM 1 MG PO TABS
ORAL_TABLET | ORAL | Status: AC
  Administered 2015-05-23: 1 mg via ORAL
  Filled 2015-05-23: qty 1

## 2015-05-23 MED ORDER — ESCITALOPRAM OXALATE 10 MG PO TABS
10.0000 mg | ORAL_TABLET | Freq: Every day | ORAL | Status: DC
  Administered 2015-05-23: 10 mg via ORAL
  Filled 2015-05-23: qty 1

## 2015-05-23 NOTE — ED Notes (Signed)
Pt. Up to use bathroom.  Pt. States unable to sleep due to anxiety.

## 2015-05-23 NOTE — ED Notes (Signed)
Pt cooperative, calm. Denies pain, HI/AVH. Pt has passive SI. "Thoes thoughts never leave my mind." Contracts for safety on the unit. Pt had flat affect. Not forthcoming with much information to this Clinical research associatewriter. No concerns voiced. No distress noted. Maintained on 15 minute checks and observation by security camera for safety.

## 2015-05-23 NOTE — Progress Notes (Signed)
D:  Patient is a 60 year-old male admitted to [facility] ambulatory without difficulty.  Patient is alert and oriented upon admission. A:  Admission assessment completed without difficulty.  Skin and contraband assessment completed with no skin abnormalities nor contraband found.  Q.15 minute safety checks were implemented at the time of admission.  Patient was oriented to the unit and escorted to room #306. R:  Patient was receptive to and cooperative with admission assessment.  Patient contracts for safety on the unit at this time

## 2015-05-23 NOTE — Progress Notes (Signed)
Recreation Therapy Notes  Date: 03.13.17 Time: 3:00 pm Location: Craft Room  Group Topic: Wellness  Goal Area(s) Addresses:  Patient will identify at least one item per dimension of health. Patient will examine areas they are deficient in.  Behavioral Response: Did not attend  Intervention: 6 Dimensions of Health  Activity: Patients were given a worksheet with the definitions of the 6 Dimensions of Health. Patients were given a pie chart with the 6 dimensions of healthy in each section and instructed to write 2-3 things they were currently doing in each section.  Education: LRT educated patients on ways they can increase each area.  Education Outcome: Patient did not attend group.  Clinical Observations/Feedback: Patient did not attend group.  Jacquelynn CreeGreene,Terrah Decoster M, LRT/CTRS 05/23/2015 4:02 PM

## 2015-05-23 NOTE — ED Notes (Signed)
Patient resting quietly in room. No noted distress or abnormal behaviors noted. Will continue 15 minute checks and observation by security camera for safety. 

## 2015-05-23 NOTE — [facility] Group Notes (Signed)
[facility] Group Notes:  (Nursing/MHT/Case Management/Adjunct)  Date:  05/23/2015  Time:  9:15 PM  Type of Therapy:  Group Therapy  Participation Level:  Active  Participation Quality:  Appropriate  Affect:  Appropriate  Cognitive:  Appropriate  Insight:  Appropriate  Engagement in Group:  Engaged  Modes of Intervention:  Discussion  Summary of Progress/Problems:  Burt EkJanice Marie Elward Nocera 05/23/2015, 9:15 PM

## 2015-05-23 NOTE — ED Notes (Signed)
Pt will be discharged to BMU. Pt positive for passive SI, but contracts for safety on the unit. Denies HI and AVH. Belongings will be sent with pt and given to security. Pt denies pain.

## 2015-05-23 NOTE — [facility] Group Notes (Signed)
[facility] LCSW Group Therapy   05/23/2015 1pm  Type of Therapy: Group Therapy   Participation Level: Did Not Attend. Patient invited to participate but declined.    Kieron Kantner F. Kurtiss Wence, MSW, LCSWA, LCAS   
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
## 2015-05-23 NOTE — ED Notes (Signed)
Pt complainung of nausea. Medication administered as ordered by ER MD. Pt cooperative and calm. No further complaints voiced. No distress noted. Maintained on 15 minute checks and observation by security camera for safety.

## 2015-05-23 NOTE — Tx Team (Signed)
Initial Interdisciplinary Treatment Plan   PATIENT STRESSORS: Financial difficulties Health problems Medication change or noncompliance Substance abuse   PATIENT STRENGTHS: Ability for insight Communication skills General fund of knowledge Motivation for treatment/growth Supportive family/friends   PROBLEM LIST: Problem List/Patient Goals Date to be addressed Date deferred Reason deferred Estimated date of resolution  "figure out why I feel this way" 05/23/15     "what can I do to not feel this way" 05/23/15     depression 05/23/15     suicidal 05/23/15     Alcohol abuse 05/23/15                              DISCHARGE CRITERIA:  Ability to meet basic life and health needs Adequate post-discharge living arrangements Improved stabilization in mood, thinking, and/or behavior Motivation to continue treatment in a less acute level of care Verbal commitment to aftercare and medication compliance  PRELIMINARY DISCHARGE PLAN: Outpatient therapy  PATIENT/FAMIILY INVOLVEMENT: This treatment plan has been presented to and reviewed with the patient, Juan Wood.  The patient and family have been given the opportunity to ask questions and make suggestions.  Moshe SalisburyJennifer L Gerilyn Stargell 05/23/2015, 4:26 PM

## 2015-05-23 NOTE — ED Notes (Signed)
Pt compliant with medications and VS. Resting in room. No concerns. No distress noted. Maintained on 15 minute checks and observation by security camera for safety.

## 2015-05-23 NOTE — BH Assessment (Signed)
Patient [facility] to be [facility] by Dr. Garnetta BuddyFaheem.  Attending Physician will be Dr. Jennet MaduroPucilowska.   Patient has [facility].   ER staff is aware of the admission Rivka Barbara(Glenda, ER Sect.; Dr. Cyril LoosenKinner, ER MD; Toniann FailWendy,  Patient's Nurse & Rob, Patient Access).

## 2015-05-24 DIAGNOSIS — F332 Major depressive disorder, recurrent severe without psychotic features: Secondary | ICD-10-CM | POA: Diagnosis present

## 2015-05-24 DIAGNOSIS — F1024 Alcohol dependence with alcohol-induced mood disorder: Secondary | ICD-10-CM

## 2015-05-24 MED ORDER — FLUTICASONE PROPIONATE 50 MCG/ACT NA SUSP
2.0000 | Freq: Every day | NASAL | Status: DC
  Administered 2015-05-25 – 2015-05-26 (×2): 2 via NASAL
  Filled 2015-05-24: qty 16

## 2015-05-24 MED ORDER — CHLORDIAZEPOXIDE HCL 25 MG PO CAPS
50.0000 mg | ORAL_CAPSULE | Freq: Four times a day (QID) | ORAL | Status: DC
  Administered 2015-05-24 – 2015-05-26 (×9): 50 mg via ORAL
  Filled 2015-05-24 (×9): qty 2

## 2015-05-24 MED ORDER — PSEUDOEPHEDRINE HCL ER 120 MG PO TB12
120.0000 mg | ORAL_TABLET | Freq: Two times a day (BID) | ORAL | Status: DC
  Administered 2015-05-25 – 2015-05-26 (×3): 120 mg via ORAL
  Filled 2015-05-24 (×5): qty 1

## 2015-05-24 MED ORDER — GABAPENTIN 600 MG PO TABS
300.0000 mg | ORAL_TABLET | Freq: Three times a day (TID) | ORAL | Status: DC
  Administered 2015-05-24 – 2015-05-26 (×7): 300 mg via ORAL
  Filled 2015-05-24: qty 1
  Filled 2015-05-24: qty 2
  Filled 2015-05-24 (×5): qty 1

## 2015-05-24 NOTE — BHH Suicide Risk Assessment (Signed)
[facility] INPATIENT:  Family/Significant Other Suicide Prevention Education  Suicide Prevention Education:  Patient Refusal for Family/Significant Other Suicide Prevention Education: The patient Juan Wood has refused to provide written consent for family/significant other to be provided Family/Significant Other Suicide Prevention Education during admission and/or prior to discharge.  Physician notified.  CSW completed SPE with pt.  Dorothe PeaJonathan F Fatou Dunnigan 05/24/2015, 10:46 AM

## 2015-05-24 NOTE — Progress Notes (Signed)
Recreation Therapy Notes  INPATIENT RECREATION THERAPY ASSESSMENT  Patient Details Name: Juan Wood MRN: [id number] DOB: 1956/01/30 Today's Date: 05/24/2015  Patient Stressors: Relationship, Death, Friends, Other (Comment) (Separated from wife 5-6 months ago; sister-in-law died 6 months ago, mom the year before that, and brother the year before that; lack of supportive friends' jumping from being homeless to living with family and friends)  Coping Skills:   Isolate, Arguments, Substance Abuse, Avoidance  Personal Challenges: Anger, Communication, Concentration, Decision-Making, Expressing Yourself, Problem-Solving, Relationships, Self-Esteem/Confidence, Social Interaction, Stress Management, Substance Abuse, Time Management, Trusting Others  Leisure Interests (2+):  Nature - Therapist, musicishing, Individual - Other (Comment) (Walking)  Awareness of Community Resources:  Yes  Community Resources:  Park, Other (Comment) [place]  Current Use: No  If no, Barriers?: Other (Comment) (Depression - doesn't care about anything anymore)  Patient Strengths:  4 daughters, hasn't killed himself yet  Patient Identified Areas of Improvement:  Everything  Current Recreation Participation:  "I haven't had fun in a while"  Patient Goal for Hospitalization:  Figure out what's wrong with his mind and why he thinks why he thinks  [place]  County of Residence:  [place]   Current SI (including self-harm):  Yes ("Every so often")  Current HI:  No  Consent to Intern Participation: N/A   Jacquelynn CreeGreene,[name], LRT/CTRS 05/24/2015, 11:53 AM
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
## 2015-05-24 NOTE — [facility] Suicide Risk Assessment (Signed)
[facility]   Nursing information obtained from:    Demographic factors:    Current Mental Status:    Loss Factors:    Historical Factors:    Risk Reduction Factors:     Total Time spent with patient: 1 hour Principal Problem: Alcohol dependence with alcohol-induced mood disorder (HCC) Diagnosis:   Patient Active Problem List   Diagnosis Date Noted  . Sedative, hypnotic or anxiolytic use disorder, mild, abuse [F13.10] 05/23/2015  . Tobacco use disorder [F17.200] 05/23/2015  . Hypothyroidism [E03.9] 05/23/2015  . HTN (hypertension) [I10] 05/23/2015  . GERD (gastroesophageal reflux disease) [K21.9] 05/23/2015  . Alcohol abuse with intoxication (HCC) [F10.129] 05/23/2015  . Alcohol dependence with alcohol-induced mood disorder (HCC) [F10.24] 05/23/2015   Subjective Data: Depression, suicidal ideation, substance abuse.  Continued Clinical Symptoms:  Alcohol Use Disorder Identification Test Final Score (AUDIT): 24 The "Alcohol Use Disorders Identification Test", Guidelines for Use in Primary Care, Second Edition.  World Science writerHealth Organization [facility](WHO). Score between 0-7:  no or low risk or alcohol related problems. Score between 8-15:  moderate risk of alcohol related problems. Score between 16-19:  high risk of alcohol related problems. Score 20 or above:  warrants further diagnostic evaluation for alcohol dependence and treatment.   CLINICAL FACTORS:   Depression:   Anhedonia Comorbid alcohol abuse/dependence Impulsivity Severe Alcohol/Substance Abuse/Dependencies Chronic Pain Unstable or Poor Therapeutic Relationship Previous Psychiatric Diagnoses and Treatments Medical Diagnoses and Treatments/Surgeries   Musculoskeletal: Strength & Muscle Tone: within normal limits Gait & Station: normal Patient leans: N/A  Psychiatric Specialty Exam: Review of Systems  Musculoskeletal: Positive for back pain and joint pain.  Neurological: Positive for  tremors.  Psychiatric/Behavioral: Positive for depression, suicidal ideas and substance abuse.  All other systems reviewed and are negative.   Blood pressure 111/84, pulse 77, temperature 97.8 F (36.6 C), temperature source Oral, resp. rate 18, height 5\' 9"  (1.753 m), weight 58.968 kg (130 lb), SpO2 97 %.Body mass index is 19.19 kg/(m^2).  General Appearance: Disheveled  Eye Contact::  Minimal  Speech:  Clear and Coherent  Volume:  Decreased  Mood:  Depressed, Hopeless and Worthless  Affect:  Blunt  Thought Process:  Goal Directed  Orientation:  Full (Time, Place, and Person)  Thought Content:  WDL  Suicidal Thoughts:  Yes.  with intent/plan  Homicidal Thoughts:  No  Memory:  Immediate;   Fair Recent;   Fair Remote;   Fair  Judgement:  Impaired  Insight:  Shallow  Psychomotor Activity:  Decreased  Concentration:  Fair  Recall:  [employer]Fair  Fund of Knowledge:Fair  Language: Fair  Akathisia:  No  Handed:  Right  AIMS (if indicated):     Assets:  Communication Skills Desire for Improvement Resilience  Sleep:  Number of Hours: 8  Cognition: WNL  ADL's:  Intact    COGNITIVE FEATURES THAT CONTRIBUTE TO RISK:  None    SUICIDE RISK:   Moderate:  Frequent suicidal ideation with limited intensity, and duration, some specificity in terms of plans, no associated intent, good self-control, limited dysphoria/symptomatology, some risk factors present, and identifiable protective factors, including available and accessible social support.  PLAN OF CARE: Hospital admission, medication management, substance was counseling, discharge planning.  Mr. Juan Wood is a 60 year old male with a history of alcoholism admitted for suicidal ideation in the context of treatment noncompliance, severe social stressors and relapse on alcohol.  1. Suicidal ideation. The patient still suicidal. He is able to contract for safety in the hospital.  2. Mood. He has been taking Prozac in the past with some  improvement. We restarted Prozac.  3. Alcohol dependence. He has been drinking 16-18 beers a day since February 7. We'll offer Librium taper. Vital signs are stable so far.  4. Substance abuse treatment. The patient has been to [facility] several times.this time he is interested in a halfway house.   5. Hypertension. He is on Norvasc.   6. Hypothyroidism. He is on Synthroid.   7. GERD. He is on Protonix.     8. Insomnia. He is on trazodone.   9. Smoking. Nicotine patch is available.   10. Disposition. To be established.   I certify that inpatient services furnished can reasonably be expected to improve the patient's condition.   Kristine Linea, MD 05/24/2015, 8:57 AM

## 2015-05-24 NOTE — Progress Notes (Signed)
Recreation Therapy Notes  Date: 03.14.17 Time: 3:00 pm Location: Craft Room  Group Topic: Self-expression  Goal Area(s) Addresses:  Patient will be able to identify a color that represents each emotion. Patient will verbalize benefit of using art as a means of self-expression. Patient will verbalize one positive emotion experienced while participating in group.  Behavioral Response: Did not attend  Intervention: The Colors Within Me  Activity: Patients were given a blank face worksheet and instructed to pick a color for each emotion they were feeling and to show how much of that emotion they were feeling on the face.  Education: LRT educated patients on other forms of self-expression.  Education Outcome: Patient did not attend group.   Clinical Observations/Feedback: Patient did not attend group.  Jacquelynn CreeGreene,Birdella Sippel M, LRT/CTRS 05/24/2015 4:11 PM

## 2015-05-24 NOTE — Plan of Care (Signed)
Problem: Diagnosis: Increased Risk For Suicide Attempt Goal: STG-Patient Will Comply With Medication Regime Outcome: Progressing Patient has been complaint with medication during this admission.

## 2015-05-24 NOTE — Progress Notes (Signed)
D: Patient appears sad. States he drinks 16-17 beers per day. C/o HA, nausea, and tremors. States he has passive SI, denies HI/AVH. States he's been talking to himself a lot.  A: Medication given with education. Encouragement provided. PRN ibuprofen given. Gingerale given to help with nausea.  R: Patient was compliant with medication. He has remained calm and cooperative. Safety maintained with 15 min checks.

## 2015-05-24 NOTE — Progress Notes (Signed)
Patient with appropriate affect, cooperative behavior with meals, meds and plan of care. No SI/HI at this time. Encouraged to attend therapy groups to learn and initiate coping skills. Safety maintained.

## 2015-05-24 NOTE — [facility] Group Notes (Signed)
[facility] LCSW Group Therapy   05/24/2015 1pm  Type of Therapy: Group Therapy   Participation Level: Did Not Attend. Patient invited to participate but declined.    Juan Wood F. Edilson Vital, MSW, LCSWA, LCAS   
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
## 2015-05-24 NOTE — [facility] Group Notes (Signed)
[facility] Group Notes:  (Nursing/MHT/Case Management/Adjunct)  Date:  05/24/2015  Time:  2:56 PM  Type of Therapy:  Psychoeducational Skills  Participation Level:  Did Not Attend   Lynelle SmokeCara [facility] - DallasMadoni 05/24/2015, 2:56 PM
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
## 2015-05-25 DIAGNOSIS — F1024 Alcohol dependence with alcohol-induced mood disorder: Secondary | ICD-10-CM | POA: Insufficient documentation

## 2015-05-25 DIAGNOSIS — B1920 Unspecified viral hepatitis C without hepatic coma: Secondary | ICD-10-CM | POA: Diagnosis present

## 2015-05-25 MED ORDER — PROMETHAZINE HCL 25 MG PO TABS: 25.0000 mg | ORAL_TABLET | Freq: Four times a day (QID) | ORAL | Status: DC | PRN

## 2015-05-25 MED ORDER — RISPERIDONE 1 MG PO TABS
2.0000 mg | ORAL_TABLET | Freq: Every day | ORAL | Status: DC
  Administered 2015-05-25: 2 mg via ORAL
  Filled 2015-05-25: qty 2

## 2015-05-25 MED ORDER — OXYMETAZOLINE HCL 0.05 % NA SOLN
1.0000 | Freq: Two times a day (BID) | NASAL | Status: DC
  Administered 2015-05-25 – 2015-05-26 (×3): 1 via NASAL
  Filled 2015-05-25: qty 15

## 2015-05-25 NOTE — [facility] Group Notes (Signed)
[facility] Group Notes:  (Nursing/MHT/Case Management/Adjunct)  Date:  05/25/2015  Time:  10:17 PM  Type of Therapy:  Group Therapy  Participation Level:  Active  Participation Quality:  Attentive  Affect:  Defensive and Irritable  Cognitive:  Alert  Insight:  Lacking  Engagement in Group:  Defensive  Modes of Intervention:  Discussion  Summary of Progress/Problems: Pt brought up his list of phone numbers again. Staff again advised PT that his phone numbers were nowhere on the unit; it was his responsibility to keep up with them. Pt begin to get upset. Staff was able to redirect PT.   Juan Wood 05/25/2015, 10:17 PM

## 2015-05-25 NOTE — Plan of Care (Signed)
Problem: Alteration in mood & ability to function due to Goal: STG-Patient will comply with prescribed medication regimen (Patient will comply with prescribed medication regimen)  Outcome: Progressing Patient has been compliant with medication during this admission.

## 2015-05-25 NOTE — [facility] Group Notes (Signed)
[facility] LCSW Aftercare Discharge Planning Group Note  05/25/2015 9:30 AM  Participation Quality: Did Not Attend. Patient invited to participate but declined.   Aunica Dauphinee F. Shaneal Barasch, MSW, LCSWA, LCAS   

## 2015-05-25 NOTE — Plan of Care (Signed)
[facility] Patient Education Outcome: Progressing Cooperative with meds and detox plan of care.

## 2015-05-25 NOTE — Progress Notes (Signed)
[facility] MD Progress Note  05/25/2015 5:43 PM Juan Wood  MRN:  [id number]  Subjective:  Juan Wood is still despondent today. He doesn't care whether he lives or dies. He is in bed in his room. He has not been participating in programming. He complains of tremors, sweats, nausea, unwitnessed vomiting, and heightened anxiety. He also complains of stuffy nose and back pain.  Principal Problem: Major depressive disorder, recurrent severe without psychotic features (HCC) Diagnosis:   Patient Active Problem List   Diagnosis Date Noted  . Major depressive disorder, recurrent severe without psychotic features (HCC) [F33.2] 05/24/2015  . Sedative, hypnotic or anxiolytic use disorder, mild, abuse [F13.10] 05/23/2015  . Tobacco use disorder [F17.200] 05/23/2015  . Hypothyroidism [E03.9] 05/23/2015  . HTN (hypertension) [I10] 05/23/2015  . GERD (gastroesophageal reflux disease) [K21.9] 05/23/2015  . Alcohol abuse with intoxication [facility]) [F10.129] 05/23/2015   Total Time spent with patient: 30 minutes  Past Psychiatric History: Depression, substance use.  Past Medical History:  Past Medical History  Diagnosis Date  . Depression   . Hepatitis C   . Alcohol abuse   . Drug abuse   . Hypertension   . Thyroid disease     Past Surgical History  Procedure Laterality Date  . Hernia repair    . Knee surgery      right   Family History:  Family History  Problem Relation Age of Onset  . Heart attack Father    Family Psychiatric  History: See H&P. Social History:  History  Alcohol Use  . 7.2 oz/week  . 12 Cans of beer per week    Comment: occasional     History  Drug Use  . Yes  . Special: Cocaine, Opium, Marijuana    Social History   Social History  . Marital Status: Married    Spouse Name: N/A  . Number of Children: N/A  . Years of Education: N/A   Social History Main Topics  . Smoking status: Current Every Day Smoker -- 0.50 packs/day for 35 years    Types: Cigarettes   . Smokeless tobacco: None  . Alcohol Use: 7.2 oz/week    12 Cans of beer per week     Comment: occasional  . Drug Use: Yes    Special: Cocaine, Opium, Marijuana  . Sexual Activity: No   Other Topics Concern  . None   Social History Narrative   Additional Social History:                         Sleep: Fair  Appetite:  Fair  Current Medications: Current Facility-Administered Medications  Medication Dose Route Frequency Provider Last Rate Last Dose  . alum & mag hydroxide-simeth (MAALOX/MYLANTA) 200-200-20 MG/5ML suspension 30 mL  30 mL Oral Q4H PRN Brandy Hale, MD      . amLODipine (NORVASC) tablet 5 mg  5 mg Oral Daily Brian Zeitlin B Giles Currie, MD   5 mg at 05/25/15 1307  . chlordiazePOXIDE (LIBRIUM) capsule 50 mg  50 mg Oral QID Shari Prows, MD   50 mg at 05/25/15 1657  . FLUoxetine (PROZAC) capsule 40 mg  40 mg Oral Daily Shari Prows, MD   40 mg at 05/25/15 0916  . fluticasone (FLONASE) 50 MCG/ACT nasal spray 2 spray  2 spray Each Nare Daily Shari Prows, MD   2 spray at 05/25/15 0921  . gabapentin (NEURONTIN) tablet 300 mg  300 mg Oral TID Keala Drum B Doneen Ollinger,  MD   300 mg at 05/25/15 1657  . ibuprofen (ADVIL,MOTRIN) tablet 600 mg  600 mg Oral Q6H PRN Shari Prows, MD   600 mg at 05/24/15 2136  . levothyroxine (SYNTHROID, LEVOTHROID) tablet 25 mcg  25 mcg Oral QAC breakfast Shari Prows, MD   25 mcg at 05/25/15 0646  . magnesium hydroxide (MILK OF MAGNESIA) suspension 30 mL  30 mL Oral Daily PRN Brandy Hale, MD      . oxymetazoline (AFRIN) 0.05 % nasal spray 1 spray  1 spray Each Nare BID Shari Prows, MD   1 spray at 05/25/15 1558  . pantoprazole (PROTONIX) EC tablet 40 mg  40 mg Oral Daily Shari Prows, MD   40 mg at 05/25/15 0918  . promethazine (PHENERGAN) tablet 25 mg  25 mg Oral Q6H PRN Deangela Randleman B Caio Devera, MD      . pseudoephedrine (SUDAFED) 12 hr tablet 120 mg  120 mg Oral BID Shari Prows, MD    120 mg at 05/25/15 0921  . risperiDONE (RISPERDAL) tablet 2 mg  2 mg Oral QHS Jayln Branscom B Aamirah Salmi, MD      . traZODone (DESYREL) tablet 100 mg  100 mg Oral QHS Shari Prows, MD   100 mg at 05/24/15 2136    Lab Results: No results found for this or any previous visit (from the past 48 hour(s)).  Blood Alcohol level:  Lab Results  Component Value Date   ETH <5 05/22/2015   ETH <5 04/27/2015    Physical Findings: AIMS:  , ,  ,  ,    CIWA:    COWS:     Musculoskeletal: Strength & Muscle Tone: within normal limits Gait & Station: normal Patient leans: N/A  Psychiatric Specialty Exam: Review of Systems  All other systems reviewed and are negative.   Blood pressure 102/63, pulse 81, temperature 97.8 F (36.6 C), temperature source Oral, resp. rate 18, height  (1.753 m), weight 58.968 kg (130 lb), SpO2 97 %.Body mass index is 19.19 kg/(m^2).  General Appearance: Fairly Groomed  Patent attorney::  Fair  Speech:  Clear and Coherent  Volume:  Normal  Mood:  Depressed, Hopeless and Worthless  Affect:  Appropriate  Thought Process:  Goal Directed  Orientation:  Full (Time, Place, and Person)  Thought Content:  WDL  Suicidal Thoughts:  Yes.  with intent/plan  Homicidal Thoughts:  No  Memory:  Immediate;   Fair Recent;   Fair Remote;   Fair  Judgement:  Poor  Insight:  Shallow  Psychomotor Activity:  Decreased  Concentration:  Fair  Recall:  [employer] of Knowledge:Fair  Language: Fair  Akathisia:  No  Handed:  Right  AIMS (if indicated):     Assets:  Communication Skills Desire for Improvement Resilience Social Support  ADL's:  Intact  Cognition: WNL  Sleep:  Number of Hours: 6.75   Treatment Plan Summary: Daily contact with patient to assess and evaluate symptoms and progress in treatment and Medication management   Juan Wood is a 60 year old male with a history of alcoholism admitted for suicidal ideation in the context of treatment noncompliance,  severe social stressors and relapse on alcohol.  1. Suicidal ideation. The patient still suicidal. He is able to contract for safety in the hospital.  2. Mood. He has been taking Prozac in the past with some improvement. We restarted Prozac and added Risperdal for mood stabilization.   3. Alcohol dependence. He has been drinking  16-18 beers a day since February 7. He is on no sorry no plan now Librium taper. Vital signs are stable so far. We offered Phenergan for nausea and vomiting.  4. Substance abuse treatment. The patient has been to [facility] several times. This time he is interested in a halfway house. [facility]. He is somewhat interested.  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  6. Hypothyroidism. He is on Synthroid.   7. GERD. He is on Protonix.   8. Insomnia. He is on trazodone.   9. Smoking. Nicotine patch is available.   10. Metabolic syndrome monitoring. Hemoglobin A1c, lipid panel, TSH and prolactin are pending.   11. Nasal congestion. We offered Afrin, Flonase, and Sudafed.   12. Failure to thrive. We offered that proportions.   13. Disposition. To be established.   Kristine LineaJolanta Randye Treichler, MD 05/25/2015, 5:43 PM

## 2015-05-25 NOTE — [facility] Group Notes (Signed)
[facility] LCSW Group Therapy   05/25/2015 1pm  Type of Therapy: Group Therapy   Participation Level: Did Not Attend. Patient invited to participate but declined.    Juan Wood F. Hildur Bayer, MSW, LCSWA, LCAS   

## 2015-05-25 NOTE — Progress Notes (Signed)
D: Patient has been agitated. He stated [facility] and has not been able to get them back since. He stated these numbers are not in his phone and he has no other way to get them. The numbers are not in his chart or in his belongings locker. The [facility] has been called and the name of the nurse has been noted. Patient denies SI/HI/AVH. Patient states he still has generalized aches and nausea.  A: Medications given with education. Encouragement provided. PRN ibuprofen given.  R: Patient was compliant with medication. He has remained calm an cooperative. Safety maintained with 15 min checks.

## 2015-05-25 NOTE — Progress Notes (Signed)
Recreation Therapy Notes  Date: 03.15.17 Time: 3:00 pm Location: Craft Room  Group Topic: Self-esteem  Goal Area(s) Addresses:  Patient will write at least one positive trait. Patient will verbalize benefit of having a healthy self-esteem.  Behavioral Response: Attentive, Interactive  Intervention: I Am  Activity: Patients were given a worksheet with the letter I on it and instructed to write as many positive traits inside the letter.  Education: LRT educated patients on ways they can increase their self-esteem.  Education Outcome: Acknowledges education/In group clarification offered  Clinical Observations/Feedback: Patient completed activity by writing positive traits down. Patient contributed to group discussion by stating how his self-esteem affects him.  Jacquelynn CreeGreene,Baldo Hufnagle M, LRT/CTRS 05/25/2015 4:40 PM

## 2015-05-25 NOTE — [facility] Group Notes (Signed)
[facility] Group Notes:  (Nursing/MHT/Case Management/Adjunct)  Date:  05/25/2015  Time:  3:08 PM  Type of Therapy:  Psychoeducational Skills  Participation Level:  Did Not Attend   Lynelle SmokeCara Travis [facility] 05/25/2015, 3:08 PM

## 2015-05-25 NOTE — Plan of Care (Signed)
Problem: [facility] Participation in Recreation Therapeutic Interventions Goal: STG-Patient will identify at least five coping skills for ** STG: Coping Skills - Within 4 treatment sessions, patient will verbalize at least 5 coping skills for substance abuse in each of 2 treatment sessions to decrease substance abuse post d/c.  Outcome: Progressing Treatment Session 1; Completed 1 out of 2: At approximately 1:10 pm, LRT met with patient in consultation room. Patient verbalized 5 coping skills for substance abuse. LRT educated patient on leisure and why it is important to implement into his schedule. LRT educated and provided patient with blank schedules to help him plan his day and try to avoid using substances. LRT educated patient on healthy support systems. Intervention Used: Coping Skills worksheet  Leonette Monarch, LRT/CTRS 03.15.17 2:52 pm  Problem: [facility] Participation in Recreation Therapeutic Interventions Goal: STG-Other Recreation Therapy Goal (Specify) STG: Stress Management - Within 4 treatment sessions, patient will verbalize understanding of the stress management techniques in each of 2 treatment sessions to increase stress management skills post d/c. Outcome: Progressing Treatment Session 1; Completed 1 out of 2; At approximately 1:10 pm, LRT met with patient in consultation room. LRT educated and provided patient with handouts on stress management techniques. Patient verbalized understanding LRT encouraged patient to continue saying positive affirmation statements. Intervention Used: Stress Management handouts  Leonette Monarch, LRT/CTRS 03.15.17 2:53 pm

## 2015-05-25 NOTE — Progress Notes (Signed)
Patient with depressed affect, cooperative behavior with meals, meds and detox plan of care. Patient withdrawn and sleepy in bed. Vitals signs monitored and recorded. Librium as ordered. PO fluids encouraged. Rests during free time. No SI/HI at this time. States he feels like he is getting a cold and Sudafed and Flonase as ordered. Safety maintained.

## 2015-05-26 MED ORDER — LEVOTHYROXINE SODIUM 25 MCG PO TABS: 25.0000 ug | ORAL_TABLET | Freq: Every day | ORAL | Status: DC

## 2015-05-26 MED ORDER — RISPERIDONE 1 MG PO TABS
0.5000 mg | ORAL_TABLET | Freq: Every day | ORAL | Status: DC
Start: 2015-05-26 — End: 2015-05-26

## 2015-05-26 MED ORDER — GABAPENTIN 600 MG PO TABS: 300.0000 mg | ORAL_TABLET | Freq: Three times a day (TID) | ORAL | Status: DC

## 2015-05-26 MED ORDER — TRAZODONE HCL 50 MG PO TABS: 50.0000 mg | ORAL_TABLET | Freq: Every evening | ORAL | Status: DC | PRN

## 2015-05-26 MED ORDER — OMEPRAZOLE 20 MG PO CPDR: 20.0000 mg | DELAYED_RELEASE_CAPSULE | Freq: Every day | ORAL | Status: DC

## 2015-05-26 MED ORDER — FLUOXETINE HCL 40 MG PO CAPS: 40.0000 mg | ORAL_CAPSULE | Freq: Every day | ORAL | Status: DC

## 2015-05-26 MED ORDER — AMLODIPINE BESYLATE 5 MG PO TABS: 10.0000 mg | ORAL_TABLET | Freq: Every day | ORAL | Status: DC

## 2015-05-26 MED ORDER — CHLORDIAZEPOXIDE HCL 10 MG PO CAPS
10.0000 mg | ORAL_CAPSULE | Freq: Four times a day (QID) | ORAL | Status: AC
  Administered 2015-05-26 (×2): 10 mg via ORAL
  Filled 2015-05-26 (×2): qty 1

## 2015-05-26 MED ORDER — TRAZODONE HCL 50 MG PO TABS: 50.0000 mg | ORAL_TABLET | Freq: Every day | ORAL | Status: DC

## 2015-05-26 MED ORDER — PANTOPRAZOLE SODIUM 40 MG PO TBEC: 40.0000 mg | DELAYED_RELEASE_TABLET | Freq: Every day | ORAL | Status: DC

## 2015-05-26 NOTE — [facility] Group Notes (Signed)
[facility] Group Notes:  (Nursing/MHT/Case Management/Adjunct)  Date:  05/26/2015  Time:  2:06 PM  Type of Therapy:  Group Therapy  Participation Level:  Active  Participation Quality:  Attentive  Affect:  Appropriate  Cognitive:  Alert, Appropriate and Oriented  Insight:  Appropriate  Engagement in Group:  Improving  Modes of Intervention:  Activity and Discussion  Summary of Progress/Problems:  Ariyan Sinnett De'Chelle Doniesha Landau 05/26/2015, 2:06 PM

## 2015-05-26 NOTE — Progress Notes (Addendum)
  [facility] :  Will you be returning to the same living situation after discharge:  No. [place] [facility] [place] [place] [facility] At discharge, do you have transportation home?: Yes,  pt will be picked up by his wife.  Do you have the ability to pay for your medications: Yes,  pt will be provided with prescriptions at discharge  Release of information consent forms completed and in the chart;  Patient's signature needed at discharge.  Patient to Follow up at:  [facility]  [address],  [place], [place] [address] Phone: [phone] Fax: [phone]  Please arrive on Friday 05/27/15 at 9am for your appointment for an assessment for medication managment and therapy   Next level of care provider [facility] Link:no  Safety Planning and Suicide Prevention discussed: Yes,  completed with pt  Have you used any form of tobacco in the last 30 days? (Cigarettes, Smokeless Tobacco, Cigars, and/or Pipes): Yes  Has patient been referred to the Quitline?: Patient refused referral  Patient has been referred for addiction treatment: Yes  Juan Wood 05/26/2015, 2:35 PM

## 2015-05-26 NOTE — Plan of Care (Signed)
Problem: Alteration in mood Goal: LTG-Patient reports reduction in suicidal thoughts (Patient reports reduction in suicidal thoughts and is able to verbalize a safety plan for whenever patient is feeling suicidal)  Outcome: Not Progressing Pt still endorses passive SI, but contracts for safety.

## 2015-05-26 NOTE — [facility] Suicide Risk Assessment (Signed)
[facility]   Principal Problem: Major depressive disorder, recurrent severe without psychotic features [facility](HCC) Discharge Diagnoses:  Patient Active Problem List   Diagnosis Date Noted  . Hepatitis C [B19.20] 05/25/2015  . Alcohol dependence with alcohol-induced mood disorder (HCC) [F10.24]   . Major depressive disorder, recurrent severe without psychotic features (HCC) [F33.2] 05/24/2015  . Sedative, hypnotic or anxiolytic use disorder, mild, abuse [F13.10] 05/23/2015  . Tobacco use disorder [F17.200] 05/23/2015  . Hypothyroidism [E03.9] 05/23/2015  . HTN (hypertension) [I10] 05/23/2015  . GERD (gastroesophageal reflux disease) [K21.9] 05/23/2015  . Alcohol abuse with intoxication (HCC) [F10.129] 05/23/2015    Total Time spent with patient: 30 minutes  Musculoskeletal: Strength & Muscle Tone: within normal limits Gait & Station: normal Patient leans: N/A  Psychiatric Specialty Exam: Review of Systems  All other systems reviewed and are negative.   Blood pressure 105/67, pulse 91, temperature 98 F (36.7 C), temperature source Oral, resp. rate 18, height 5\' 9"  (1.753 m), weight 58.968 kg (130 lb), SpO2 97 %.Body mass index is 19.19 kg/(m^2).  General Appearance: Casual  Eye Contact::  Good  Speech:  Clear and Coherent409  Volume:  Normal  Mood:  Anxious  Affect:  Flat  Thought Process:  Goal Directed  Orientation:  Full (Time, Place, and Person)  Thought Content:  WDL  Suicidal Thoughts:  No  Homicidal Thoughts:  No  Memory:  Immediate;   Fair Recent;   Fair Remote;   Fair  Judgement:  Impaired  Insight:  Shallow  Psychomotor Activity:  Normal  Concentration:  Fair  Recall:  [employer]Fair  Fund of Knowledge:Fair  Language: Fair  Akathisia:  No  Handed:  Right  AIMS (if indicated):     Assets:  Communication Skills Desire for Improvement Resilience  Sleep:  Number of Hours: 8  Cognition: WNL  ADL's:  Intact   Mental Status Per Nursing  Assessment::   On Admission:     Demographic Factors:  Male, Divorced or widowed, Low socioeconomic status and Unemployed  Loss Factors: Financial problems/change in socioeconomic status  Historical Factors: Prior suicide attempts, Family history of mental illness or substance abuse and Impulsivity  Risk Reduction Factors:   Sense of responsibility to family  Continued Clinical Symptoms:  Depression:   Comorbid alcohol abuse/dependence Impulsivity Alcohol/Substance Abuse/Dependencies  Cognitive Features That Contribute To Risk:  None    Suicide Risk:  Minimal: No identifiable suicidal ideation.  Patients presenting with no risk factors but with morbid ruminations; may be classified as minimal risk based on the severity of the depressive symptoms    Plan Of Care/Follow-up recommendations:  Activity:  As tolerated. Diet:  Low sodium heart healthy. Other:  Keep follow-up appointments.  Kristine LineaJolanta Pucilowska, MD 05/26/2015, 1:47 PM
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
## 2015-05-26 NOTE — [facility] Group Notes (Signed)
[facility] LCSW Group Therapy   05/26/2015 11am   Type of Therapy: Group Therapy   Participation Level: Active   Participation Quality: Attentive, Sharing and Supportive   Affect: Appropriate   Cognitive: Alert and Oriented   Insight: Developing/Improving and Engaged   Engagement in Therapy: Developing/Improving and Engaged   Modes of Intervention: Clarification, Confrontation, Discussion, Education, Exploration, Limit-setting, Orientation, Problem-solving, Rapport Building, Dance movement psychotherapisteality Testing, Socialization and Support   Summary of Progress/Problems: The topic for group was balance in life. Today's group focused on defining balance in one's own words, identifying things that can knock one off balance, and exploring healthy ways to maintain balance in life. Group members were asked to provide an example of a time when they felt off balance, describe how they handled that situation, and process healthier ways to regain balance in the future. Group members were asked to share the most important tool for maintaining balance that they [facility] and how they plan to apply this method after discharge. Pt shared that in the past he has not felt the need to use substances as much and that he feels this led to him to being able to achieve balance in his life.   Pt shared he seeks help from the recovery community when he needs assistance with balance in his life.  Pt shared that he has slept well and that he feels that this has made a huge difference in how he feels.  Pt shared he feels better mentally and physically due to adequate rest.  Pt was very agreeable with the CSW's view of the topics discussed during group and continually shared his own view points on regaining balance.  The CSW actively listened to the pt, validated his opinions on how to regain balance in life and provided feedback.  Pt demonstrated immense progress from earlier sessions as evidenced by the pt's ability to clearly articulate  his thoughts and feeling and his demonstrating support for other's sharing.

## 2015-05-26 NOTE — [facility] Group Notes (Signed)
[facility] Group Notes:  (Nursing/MHT/Case Management/Adjunct)  Date:  05/26/2015  Time:  9:24 AM  Type of Therapy:  Community Meeting   Participation Level:  Did Not Attend  Juan Wood 05/26/2015, 9:24 AM

## 2015-05-26 NOTE — Progress Notes (Addendum)
Patient with sad affect, cooperative behavior with meals and meds. Encouraged to actively participate in plan of care and attend therapy groups to learn coping skills for stressors. No SI/HI at this time. Librium taper continues, patient states tremors are less, no nausea. Reports feeling sleepy this am from HS meds. Safety maintained. Patient to discharge this evening when wife arrives to transport him in private car.

## 2015-05-26 NOTE — Plan of Care (Signed)
Problem: Consults Goal: Suicide Risk Patient Education (See Patient Education module for education specifics)  Outcome: Progressing No SI/HI at this time.      
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
## 2015-05-26 NOTE — Progress Notes (Signed)
Recreation Therapy Notes  Date: 03.16.17 Time: 3:00 pm Location: Craft Room  Group Topic: Leisure Education  Goal Area(s) Addresses:  Patient will identify activities for each letter of the alphabet. Patient will verbalize ability to integrate positive leisure into life post d/c. Patient will verbalize ability to use leisure as a Associate Professorcoping skill.  Behavioral Response: Attentive, Interactive  Intervention: Leisure Alphabet  Activity: Patients were given a Leisure Information systems managerAlphabet worksheet and instructed to list a healthy leisure activity for each letter of the alphabet.  Education: LRT educated patients on what is needed to participate in leisure.  Education Outcome: Acknowledges education/In group clarification offered  Clinical Observations/Feedback: Patient completed activity by listing healthy leisure activities. Patient contributed to group discussion by stating healthy leisure activities, what he needs to participate in leisure, and how he can integrate leisure back into his schedule.  Jacquelynn CreeGreene,Jaleen Grupp M, LRT/CTRS 05/26/2015 4:22 PM

## 2015-05-26 NOTE — Progress Notes (Signed)
Patient discharged at this time to wife in private car. Patient verbalizes understanding rt recommended discharge plan of care. Acknowledges all belongings have been returned. Safety maintained.

## 2015-05-26 NOTE — Plan of Care (Signed)
Problem: [facility] Participation in Recreation Therapeutic Interventions Goal: STG-Patient will identify at least five coping skills for ** STG: Coping Skills - Within 4 treatment sessions, patient will verbalize at least 5 coping skills for substance abuse in each of 2 treatment sessions to decrease substance abuse post d/c.  Outcome: Completed/Met Date Met:  05/26/15 Treatment Session 2; Completed 2 out of 2: At approximately 11:45 am, LRT met with patient in craft room. Patient verbalized 5 coping skills for substance abuse. LRT encouraged patient to participate in leisure activities. Intervention Used: Coping Skills worksheet  Leonette Monarch, LRT/CTRS 03.16.17 2:11 pm  Problem: [facility] Participation in Recreation Therapeutic Interventions Goal: STG-Other Recreation Therapy Goal (Specify) STG: Stress Management - Within 4 treatment sessions, patient will verbalize understanding of the stress management techniques in each of 2 treatment sessions to increase stress management skills post d/c.  Outcome: Completed/Met Date Met:  05/26/15 Treatment Session 2; Completed 2 out of 2: At approximately 11:45 am, LRT met with patient in craft room. Patient verbalized understanding of the stress management techniques. LRT encouraged patient to use the stress management techniques post d/c. Intervention Used: Stress Management handouts  Leonette Monarch, LRT/CTRS 03.16.17 2:12 pm

## 2015-05-26 NOTE — Progress Notes (Signed)
D: Observed pt in day room, but not interacting much with peers. Patient alert and oriented x4. Patient endorses passive SI, but contracts for safety. Pt denies HI/AVH. Pt affect is flat. Pt is irritable and preoccupied with lost phone numbers. Pt ruminates on this. Tremors observed, but pt stated they are getting better. Pt indicated nausea is relieved currently. Pt c/o of on going headache. A: Offered active listening and support. Provided therapeutic communication. Administered scheduled medications. Offered ibuprofen for headache. Indicated to pt that staff on the unit does have access to lost phone numbers. Educated pt on importance of Engineer, manufacturingverbal contract for safety. R: Pt cooperative. Pt medication compliant. Will continue Q15 min. checks. Safety maintained.

## 2015-05-26 NOTE — Progress Notes (Signed)
Patient ID: Juan Wood, male   DOB: 10-08-1955, 60 y.o.   MRN: [id number] Pt has been assisted by the CSW in procuring two interviews for open beds at [facility] [place], [place]

## 2015-05-27 NOTE — Progress Notes (Signed)
Recreation Therapy Notes  INPATIENT RECREATION TR PLAN  Patient Details Name: Juan Wood MRN: [id number] DOB: 07/23/1955 Today's Date: 05/27/2015  Rec Therapy Plan Is patient appropriate for Therapeutic Recreation?: Yes Treatment times per week: At least once a week TR Treatment/Interventions: 1:1 session, Group participation (Comment) (Appropriate participation in daily recreation therapy tx)  Discharge Criteria Pt will be discharged from therapy if:: Treatment goals are met, Discharged Treatment plan/goals/alternatives discussed and agreed upon by:: Patient/family  Discharge Summary Short term goals set: See Care Plan Short term goals met: Complete Progress toward goals comments: One-to-one attended Which groups?: Self-esteem, Leisure education One-to-one attended: Stress management, coping skills Reason goals not met: N/A Therapeutic equipment acquired: None Reason patient discharged from therapy: Discharge from hospital Pt/family agrees with progress & goals achieved: Yes Date patient discharged from therapy: 05/26/15   Leonette Monarch, LRT/CTRS 05/27/2015, 1:39 PM

## 2015-07-14 ENCOUNTER — Other Ambulatory Visit: Payer: Self-pay | Admitting: Nurse Practitioner

## 2015-08-09 ENCOUNTER — Emergency Department: Payer: Self-pay

## 2015-08-09 ENCOUNTER — Encounter: Payer: Self-pay | Admitting: Emergency Medicine

## 2015-08-09 ENCOUNTER — Ambulatory Visit: Payer: Self-pay | Admitting: Urology

## 2015-08-09 VITALS — BP 136/81 | HR 80 | Temp 97.9°F | Wt 118.0 lb

## 2015-08-09 DIAGNOSIS — J301 Allergic rhinitis due to pollen: Secondary | ICD-10-CM | POA: Insufficient documentation

## 2015-08-09 DIAGNOSIS — R55 Syncope and collapse: Principal | ICD-10-CM | POA: Insufficient documentation

## 2015-08-09 DIAGNOSIS — Z86711 Personal history of pulmonary embolism: Secondary | ICD-10-CM | POA: Insufficient documentation

## 2015-08-09 DIAGNOSIS — F17210 Nicotine dependence, cigarettes, uncomplicated: Secondary | ICD-10-CM | POA: Insufficient documentation

## 2015-08-09 DIAGNOSIS — N289 Disorder of kidney and ureter, unspecified: Secondary | ICD-10-CM | POA: Insufficient documentation

## 2015-08-09 DIAGNOSIS — R51 Headache: Secondary | ICD-10-CM | POA: Insufficient documentation

## 2015-08-09 DIAGNOSIS — F1310 Sedative, hypnotic or anxiolytic abuse, uncomplicated: Secondary | ICD-10-CM | POA: Insufficient documentation

## 2015-08-09 DIAGNOSIS — Z7982 Long term (current) use of aspirin: Secondary | ICD-10-CM | POA: Insufficient documentation

## 2015-08-09 DIAGNOSIS — R740 Nonspecific elevation of levels of transaminase and lactic acid dehydrogenase [LDH]: Secondary | ICD-10-CM | POA: Insufficient documentation

## 2015-08-09 DIAGNOSIS — B1920 Unspecified viral hepatitis C without hepatic coma: Secondary | ICD-10-CM | POA: Insufficient documentation

## 2015-08-09 DIAGNOSIS — K219 Gastro-esophageal reflux disease without esophagitis: Secondary | ICD-10-CM | POA: Insufficient documentation

## 2015-08-09 DIAGNOSIS — F332 Major depressive disorder, recurrent severe without psychotic features: Secondary | ICD-10-CM | POA: Insufficient documentation

## 2015-08-09 DIAGNOSIS — I517 Cardiomegaly: Secondary | ICD-10-CM | POA: Insufficient documentation

## 2015-08-09 DIAGNOSIS — Z8249 Family history of ischemic heart disease and other diseases of the circulatory system: Secondary | ICD-10-CM | POA: Insufficient documentation

## 2015-08-09 DIAGNOSIS — Z800 Family history of malignant neoplasm of digestive organs: Secondary | ICD-10-CM | POA: Insufficient documentation

## 2015-08-09 DIAGNOSIS — Z79899 Other long term (current) drug therapy: Secondary | ICD-10-CM | POA: Insufficient documentation

## 2015-08-09 DIAGNOSIS — F10229 Alcohol dependence with intoxication, unspecified: Secondary | ICD-10-CM | POA: Insufficient documentation

## 2015-08-09 DIAGNOSIS — J0100 Acute maxillary sinusitis, unspecified: Secondary | ICD-10-CM | POA: Insufficient documentation

## 2015-08-09 DIAGNOSIS — F39 Unspecified mood [affective] disorder: Secondary | ICD-10-CM | POA: Insufficient documentation

## 2015-08-09 DIAGNOSIS — I10 Essential (primary) hypertension: Secondary | ICD-10-CM | POA: Insufficient documentation

## 2015-08-09 DIAGNOSIS — E039 Hypothyroidism, unspecified: Secondary | ICD-10-CM | POA: Insufficient documentation

## 2015-08-09 DIAGNOSIS — D696 Thrombocytopenia, unspecified: Secondary | ICD-10-CM | POA: Insufficient documentation

## 2015-08-09 DIAGNOSIS — I081 Rheumatic disorders of both mitral and tricuspid valves: Secondary | ICD-10-CM | POA: Insufficient documentation

## 2015-08-09 DIAGNOSIS — M545 Low back pain: Secondary | ICD-10-CM | POA: Insufficient documentation

## 2015-08-09 DIAGNOSIS — E860 Dehydration: Secondary | ICD-10-CM | POA: Insufficient documentation

## 2015-08-09 DIAGNOSIS — M542 Cervicalgia: Secondary | ICD-10-CM | POA: Insufficient documentation

## 2015-08-09 DIAGNOSIS — Z808 Family history of malignant neoplasm of other organs or systems: Secondary | ICD-10-CM | POA: Insufficient documentation

## 2015-08-09 DIAGNOSIS — E876 Hypokalemia: Secondary | ICD-10-CM | POA: Insufficient documentation

## 2015-08-09 LAB — BASIC METABOLIC PANEL
Anion gap: 12 (ref 5–15)
BUN: 27 mg/dL — AB (ref 6–20)
CHLORIDE: 94 mmol/L — AB (ref 101–111)
CO2: 27 mmol/L (ref 22–32)
Calcium: 9.6 mg/dL (ref 8.9–10.3)
Creatinine, Ser: 1.66 mg/dL — ABNORMAL HIGH (ref 0.61–1.24)
GFR calc Af Amer: 50 mL/min — ABNORMAL LOW (ref >60)
GFR calc non Af Amer: 43 mL/min — ABNORMAL LOW (ref >60)
Glucose, Bld: 96 mg/dL (ref 65–99)
Potassium: 3.5 mmol/L (ref 3.5–5.1)
SODIUM: 133 mmol/L — AB (ref 135–145)

## 2015-08-09 LAB — CBC
HEMATOCRIT: 45.2 % (ref 40.0–52.0)
Hemoglobin: 15.5 g/dL (ref 13.0–18.0)
MCH: 31 pg (ref 26.0–34.0)
MCHC: 34.2 g/dL (ref 32.0–36.0)
MCV: 90.8 fL (ref 80.0–100.0)
Platelets: 134 10*3/uL — ABNORMAL LOW (ref 150–440)
RBC: 4.98 MIL/uL (ref 4.40–5.90)
RDW: 13.6 % (ref 11.5–14.5)
WBC: 6.4 10*3/uL (ref 3.8–10.6)

## 2015-08-09 LAB — TROPONIN I: Troponin I: <0.03 ng/mL (ref <0.031)

## 2015-08-09 MED ORDER — GABAPENTIN 300 MG PO CAPS: 300.0000 mg | ORAL_CAPSULE | Freq: Three times a day (TID) | ORAL | Status: DC

## 2015-08-09 NOTE — ED Notes (Signed)
Pt presents to triage in wheelchair. [facility] and [facility] here by MD. Reports has been having dizziness, syncopal episodes x 3, tinnitus, and headache x 3-4 days. Pt diagnosed with leaking heart valve in Oct or Nov 2016 but reports has not followed up with cardiologist. Pt c/o knot to RIGHT side of neck, tenderness to touch. Pt denies chest pain. Pt alert and oriented, speaking in complete sentences, no increased work in breathing noted.

## 2015-08-09 NOTE — Progress Notes (Signed)
Patient: Juan Wood Male    DOB: 1955-04-24   60 y.o.   MRN: [id number] Visit Date: 08/09/2015  Today's Provider: [facility]   Chief Complaint  Patient presents with  . Loss of Consciousness    multiple episodes over past 4 days  . Medication Refill    pantoprazole, gabapentin  . Mass    right side of neck  . Headache    hx increasing in intensity over past 4 days; full ears   Subjective:    HPI Patient has been having episodes of light headiness over exerting himself.  He states he is drinking a lot of fluids.  He squats when he feels the light headiness and then when he goes to stand up, he loses consciousness and falls to the ground.  He has had SOB, pain down the right arm and feels like his head is going to explode.  He also gets nauseated.    [facility] and was told he had one artery in his right arm and one of his valves wasn't working right.    Patient has noted a knot on his neck just now, but his neck has been sore for the last few days.      No Known Allergies Previous Medications   AMLODIPINE (NORVASC) 5 MG TABLET    Take 2 tablets (10 mg total) by mouth daily.   FLUOXETINE (PROZAC) 40 MG CAPSULE    Take 1 capsule (40 mg total) by mouth daily.   GABAPENTIN (NEURONTIN) 300 MG CAPSULE    TAKE ONE CAPSULE BY MOUTH 3 TIMES A DAY   GABAPENTIN (NEURONTIN) 600 MG TABLET    Take 0.5 tablets (300 mg total) by mouth 3 (three) times daily.   LEVOTHYROXINE (SYNTHROID, LEVOTHROID) 25 MCG TABLET    Take 1 tablet (25 mcg total) by mouth daily.   OMEPRAZOLE (PRILOSEC) 20 MG CAPSULE    Take 1 capsule (20 mg total) by mouth daily.   PANTOPRAZOLE (PROTONIX) 40 MG TABLET    Take 1 tablet (40 mg total) by mouth daily.   TRAZODONE (DESYREL) 50 MG TABLET    Take 1-2 tablets (50-100 mg total) by mouth at bedtime as needed for sleep.    Review of Systems  Constitutional: Positive for diaphoresis, fatigue and unexpected weight change. Negative for chills,  activity change and appetite change.  HENT: Negative for congestion, ear discharge, facial swelling, postnasal drip, rhinorrhea and sinus pressure.   Eyes: Negative for photophobia, pain, discharge, redness, itching and visual disturbance.  Respiratory: Positive for shortness of breath. Negative for apnea, cough, choking, chest tightness, wheezing and stridor.   Cardiovascular: Negative for chest pain, palpitations and leg swelling.  Gastrointestinal: Negative for abdominal pain, constipation and abdominal distention.  Endocrine: Negative for cold intolerance, heat intolerance, polydipsia, polyphagia and polyuria.  Genitourinary: Negative for dysuria, enuresis and difficulty urinating.  Musculoskeletal: Positive for neck pain and neck stiffness.  Skin: Negative for color change, pallor and rash.  Neurological: Positive for dizziness and light-headedness.  Psychiatric/Behavioral: Negative for behavioral problems, confusion and agitation.    Social History  Substance Use Topics  . Smoking status: Current Every Day Smoker -- 0.50 packs/day for 35 years    Types: Cigarettes  . Smokeless tobacco: Not on file  . Alcohol Use: 7.2 oz/week    12 Cans of beer per week     Comment: occasional   Objective:   BP 136/81 mmHg  Pulse 80  Temp(Src) 97.9  F (36.6 C)  Wt 118 lb (53.524 kg)  Physical Exam Constitutional: Well nourished. Alert and oriented, No acute distress. HEENT: [place] AT, moist mucus membranes. Trachea midline, no masses. Cardiovascular: No clubbing, cyanosis, or edema.  Bounding carotid pulses.   Respiratory: Normal respiratory effort, no increased work of breathing. GI: Abdomen is soft, non tender, non distended, no abdominal masses. Liver and spleen not palpable.  No hernias appreciated.  Stool sample for occult testing is not indicated.   GU: No CVA tenderness.  No bladder fullness or masses.   Rectal: Deferred.   Skin: No rashes, bruises or suspicious lesions. Lymph: No  cervical or inguinal adenopathy. Neurologic: Grossly intact, no focal deficits, moving all 4 extremities. Psychiatric: Normal mood and affect.      Assessment & Plan:     1. History of valvular disease with syncopal episodes:  Patient advised to seek attention in the ED.        [facility]   [facility]

## 2015-08-10 ENCOUNTER — Observation Stay: Payer: Self-pay

## 2015-08-10 ENCOUNTER — Observation Stay
Admission: EM | Admit: 2015-08-10 | Discharge: 2015-08-11 | Disposition: A | Discharge status: Home / Self Care | Payer: PRIVATE HEALTH INSURANCE | Attending: Internal Medicine | Admitting: Internal Medicine

## 2015-08-10 ENCOUNTER — Emergency Department: Payer: Self-pay

## 2015-08-10 ENCOUNTER — Encounter: Payer: Self-pay | Admitting: Internal Medicine

## 2015-08-10 ENCOUNTER — Observation Stay
Admit: 2015-08-10 | Discharge: 2015-08-10 | Disposition: A | Discharge status: Home / Self Care | Payer: Self-pay | Attending: Internal Medicine | Admitting: Internal Medicine

## 2015-08-10 DIAGNOSIS — E860 Dehydration: Secondary | ICD-10-CM

## 2015-08-10 DIAGNOSIS — E876 Hypokalemia: Secondary | ICD-10-CM

## 2015-08-10 DIAGNOSIS — M542 Cervicalgia: Secondary | ICD-10-CM

## 2015-08-10 DIAGNOSIS — I517 Cardiomegaly: Secondary | ICD-10-CM

## 2015-08-10 DIAGNOSIS — I10 Essential (primary) hypertension: Secondary | ICD-10-CM

## 2015-08-10 DIAGNOSIS — R740 Nonspecific elevation of levels of transaminase and lactic acid dehydrogenase [LDH]: Secondary | ICD-10-CM

## 2015-08-10 DIAGNOSIS — R51 Headache: Secondary | ICD-10-CM

## 2015-08-10 DIAGNOSIS — R55 Syncope and collapse: Secondary | ICD-10-CM | POA: Diagnosis not present

## 2015-08-10 DIAGNOSIS — N289 Disorder of kidney and ureter, unspecified: Secondary | ICD-10-CM

## 2015-08-10 DIAGNOSIS — D696 Thrombocytopenia, unspecified: Secondary | ICD-10-CM

## 2015-08-10 DIAGNOSIS — R519 Headache, unspecified: Secondary | ICD-10-CM

## 2015-08-10 DIAGNOSIS — R7401 Elevation of levels of liver transaminase levels: Secondary | ICD-10-CM

## 2015-08-10 DIAGNOSIS — J0100 Acute maxillary sinusitis, unspecified: Secondary | ICD-10-CM

## 2015-08-10 LAB — MAGNESIUM: Magnesium: 1.9 mg/dL (ref 1.7–2.4)

## 2015-08-10 LAB — BASIC METABOLIC PANEL
Anion gap: 8 (ref 5–15)
BUN: 25 mg/dL — AB (ref 6–20)
CHLORIDE: 99 mmol/L — AB (ref 101–111)
CO2: 28 mmol/L (ref 22–32)
CREATININE: 1.1 mg/dL (ref 0.61–1.24)
Calcium: 9.4 mg/dL (ref 8.9–10.3)
GFR calc Af Amer: >60 mL/min (ref >60)
GFR calc non Af Amer: >60 mL/min (ref >60)
GLUCOSE: 88 mg/dL (ref 65–99)
Potassium: 3.3 mmol/L — ABNORMAL LOW (ref 3.5–5.1)
Sodium: 135 mmol/L (ref 135–145)

## 2015-08-10 LAB — HEPATIC FUNCTION PANEL
ALBUMIN: 4 g/dL (ref 3.5–5.0)
ALK PHOS: 68 U/L (ref 38–126)
ALT: 141 U/L — AB (ref 17–63)
AST: 99 U/L — AB (ref 15–41)
Bilirubin, Direct: <0.1 mg/dL — ABNORMAL LOW (ref 0.1–0.5)
TOTAL PROTEIN: 7.4 g/dL (ref 6.5–8.1)
Total Bilirubin: 0.7 mg/dL (ref 0.3–1.2)

## 2015-08-10 LAB — ECHOCARDIOGRAM COMPLETE: WEIGHTICAEL: 1808 [oz_av]

## 2015-08-10 LAB — CBC
HEMATOCRIT: 44.9 % (ref 40.0–52.0)
HEMOGLOBIN: 15.1 g/dL (ref 13.0–18.0)
MCH: 31 pg (ref 26.0–34.0)
MCHC: 33.5 g/dL (ref 32.0–36.0)
MCV: 92.4 fL (ref 80.0–100.0)
Platelets: 111 10*3/uL — ABNORMAL LOW (ref 150–440)
RBC: 4.86 MIL/uL (ref 4.40–5.90)
RDW: 13.8 % (ref 11.5–14.5)
WBC: 4.3 10*3/uL (ref 3.8–10.6)

## 2015-08-10 LAB — TROPONIN I
Troponin I: <0.03 ng/mL (ref <0.031)
Troponin I: <0.03 ng/mL (ref <0.031)

## 2015-08-10 LAB — SEDIMENTATION RATE: Sed Rate: 6 mm/hr (ref 0–20)

## 2015-08-10 LAB — C-REACTIVE PROTEIN: CRP: <0.5 mg/dL (ref <1.0)

## 2015-08-10 MED ORDER — FLUOXETINE HCL 20 MG PO CAPS
40.0000 mg | ORAL_CAPSULE | Freq: Every day | ORAL | Status: DC
  Administered 2015-08-10 – 2015-08-11 (×2): 40 mg via ORAL
  Filled 2015-08-10 (×2): qty 2

## 2015-08-10 MED ORDER — TRAZODONE HCL 50 MG PO TABS
50.0000 mg | ORAL_TABLET | Freq: Every evening | ORAL | Status: DC | PRN
  Administered 2015-08-10: 50 mg via ORAL
  Filled 2015-08-10: qty 1

## 2015-08-10 MED ORDER — LEVOTHYROXINE SODIUM 25 MCG PO TABS
25.0000 ug | ORAL_TABLET | Freq: Every day | ORAL | Status: DC
  Administered 2015-08-10 – 2015-08-11 (×2): 25 ug via ORAL
  Filled 2015-08-10: qty 1

## 2015-08-10 MED ORDER — SODIUM CHLORIDE 0.9 % IV SOLN
INTRAVENOUS | Status: DC
  Administered 2015-08-10: 06:00:00 via INTRAVENOUS

## 2015-08-10 MED ORDER — ACETAMINOPHEN 325 MG PO TABS: 650.0000 mg | ORAL_TABLET | Freq: Four times a day (QID) | ORAL | Status: DC | PRN

## 2015-08-10 MED ORDER — ONDANSETRON HCL 4 MG PO TABS: 4.0000 mg | ORAL_TABLET | Freq: Four times a day (QID) | ORAL | Status: DC | PRN

## 2015-08-10 MED ORDER — GABAPENTIN 300 MG PO CAPS
300.0000 mg | ORAL_CAPSULE | Freq: Three times a day (TID) | ORAL | Status: DC
  Administered 2015-08-10 – 2015-08-11 (×5): 300 mg via ORAL
  Filled 2015-08-10 (×5): qty 1

## 2015-08-10 MED ORDER — ACETAMINOPHEN 650 MG RE SUPP: 650.0000 mg | Freq: Four times a day (QID) | RECTAL | Status: DC | PRN

## 2015-08-10 MED ORDER — LORAZEPAM 2 MG/ML IJ SOLN
1.0000 mg | Freq: Once | INTRAMUSCULAR | Status: AC
  Administered 2015-08-10: 1 mg via INTRAVENOUS

## 2015-08-10 MED ORDER — FLUTICASONE PROPIONATE 50 MCG/ACT NA SUSP
2.0000 | Freq: Every day | NASAL | Status: DC
  Administered 2015-08-10 – 2015-08-11 (×2): 2 via NASAL
  Filled 2015-08-10: qty 16

## 2015-08-10 MED ORDER — AMLODIPINE BESYLATE 10 MG PO TABS
10.0000 mg | ORAL_TABLET | Freq: Every day | ORAL | Status: DC
  Administered 2015-08-10 – 2015-08-11 (×2): 10 mg via ORAL
  Filled 2015-08-10 (×2): qty 1

## 2015-08-10 MED ORDER — GADOBENATE DIMEGLUMINE 529 MG/ML IV SOLN
10.0000 mL | Freq: Once | INTRAVENOUS | Status: AC | PRN
  Administered 2015-08-10: 5 mL via INTRAVENOUS

## 2015-08-10 MED ORDER — ASPIRIN EC 81 MG PO TBEC
81.0000 mg | DELAYED_RELEASE_TABLET | Freq: Every day | ORAL | Status: DC
  Administered 2015-08-10 – 2015-08-11 (×2): 81 mg via ORAL
  Filled 2015-08-10 (×2): qty 1

## 2015-08-10 MED ORDER — HYDROCODONE-ACETAMINOPHEN 5-325 MG PO TABS
1.0000 | ORAL_TABLET | ORAL | Status: DC | PRN
  Administered 2015-08-10: 1 via ORAL
  Administered 2015-08-10 – 2015-08-11 (×5): 2 via ORAL
  Filled 2015-08-10 (×5): qty 2

## 2015-08-10 MED ORDER — POTASSIUM CHLORIDE IN NACL 20-0.9 MEQ/L-% IV SOLN
INTRAVENOUS | Status: DC
  Administered 2015-08-10: 22:00:00 via INTRAVENOUS
  Filled 2015-08-10 (×3): qty 1000

## 2015-08-10 MED ORDER — SODIUM CHLORIDE 0.9% FLUSH: 3.0000 mL | Freq: Two times a day (BID) | INTRAVENOUS | Status: DC

## 2015-08-10 MED ORDER — MORPHINE SULFATE (PF) 2 MG/ML IV SOLN
2.0000 mg | Freq: Once | INTRAVENOUS | Status: AC
  Administered 2015-08-10: 2 mg via INTRAVENOUS

## 2015-08-10 MED ORDER — AMOXICILLIN-POT CLAVULANATE 875-125 MG PO TABS
1.0000 | ORAL_TABLET | Freq: Two times a day (BID) | ORAL | Status: DC
  Administered 2015-08-10 – 2015-08-11 (×2): 1 via ORAL
  Filled 2015-08-10 (×2): qty 1

## 2015-08-10 MED ORDER — MORPHINE SULFATE (PF) 2 MG/ML IV SOLN
2.0000 mg | INTRAVENOUS | Status: DC | PRN
  Administered 2015-08-10 – 2015-08-11 (×3): 2 mg via INTRAVENOUS
  Filled 2015-08-10 (×3): qty 1

## 2015-08-10 MED ORDER — POTASSIUM CHLORIDE CRYS ER 20 MEQ PO TBCR
20.0000 meq | EXTENDED_RELEASE_TABLET | Freq: Once | ORAL | Status: AC
  Administered 2015-08-10: 20 meq via ORAL
  Filled 2015-08-10: qty 1

## 2015-08-10 MED ORDER — ONDANSETRON HCL 4 MG/2ML IJ SOLN
4.0000 mg | Freq: Once | INTRAMUSCULAR | Status: AC
  Administered 2015-08-10: 4 mg via INTRAVENOUS

## 2015-08-10 MED ORDER — PANTOPRAZOLE SODIUM 40 MG PO TBEC
40.0000 mg | DELAYED_RELEASE_TABLET | Freq: Every day | ORAL | Status: DC
  Administered 2015-08-10 – 2015-08-11 (×2): 40 mg via ORAL
  Filled 2015-08-10 (×2): qty 1

## 2015-08-10 MED ORDER — ENOXAPARIN SODIUM 40 MG/0.4ML ~~LOC~~ SOLN
40.0000 mg | SUBCUTANEOUS | Status: DC
  Administered 2015-08-10: 40 mg via SUBCUTANEOUS
  Filled 2015-08-10: qty 0.4

## 2015-08-10 MED ORDER — ONDANSETRON HCL 4 MG/2ML IJ SOLN: 4.0000 mg | Freq: Four times a day (QID) | INTRAMUSCULAR | Status: DC | PRN

## 2015-08-10 NOTE — ED Notes (Signed)
Pt transported to room 250 

## 2015-08-10 NOTE — Progress Notes (Signed)
*  PRELIMINARY RESULTS* Echocardiogram 2D Echocardiogram has been performed.  Juan Wood 08/10/2015, 9:12 AM

## 2015-08-10 NOTE — Progress Notes (Signed)
[facility] -  at [facility]   PATIENT NAME: Juan Wood    MR#:  [id number]  DATE OF BIRTH:  May 07, 1955  SUBJECTIVE:  CHIEF COMPLAINT:   Chief Complaint  Patient presents with  . Dizziness  . Shortness of Breath  Patient is a 60 year old Caucasian male with past. No history significant for history of depression, hepatitis C, alcohol abuse, drug abuse, essential hypertension, pulmonary embolism, low back pains who presents to the hospital with complaints of frequent syncopal episodes, severe headache, which is been having for the past few months, worsening over the past 3 days, mostly in the neck area, posterior head, pounding in nature with some blurring of vision. Also ringing in the right ear. Patient was admitted to the hospital for syncope evaluation, he underwent MRA of head and neck which showed no acute intracranial process, moderate chronic microvascular ischemic changes, no large proximal arterial branch occlusions, normal MRA of the neck. Acute right maxillary sinusitis was also noted.  Review of Systems  Constitutional: Negative for fever, chills and weight loss.  HENT: Positive for tinnitus. Negative for congestion.   Eyes: Positive for blurred vision. Negative for double vision.  Respiratory: Negative for cough, sputum production, shortness of breath and wheezing.   Cardiovascular: Negative for chest pain, palpitations, orthopnea, leg swelling and PND.  Gastrointestinal: Negative for nausea, vomiting, abdominal pain, diarrhea, constipation and blood in stool.  Genitourinary: Negative for dysuria, urgency, frequency and hematuria.  Musculoskeletal: Negative for falls.  Neurological: Positive for headaches. Negative for dizziness, tremors and focal weakness.  Endo/Heme/Allergies: Does not bruise/bleed easily.  Psychiatric/Behavioral: Negative for depression. The patient does not have insomnia.     VITAL SIGNS: Blood pressure 102/64, pulse  65, temperature 98.3 F (36.8 C), temperature source Oral, resp. rate 14, height 5\' 8"  (1.727 m), weight 51.256 kg (113 lb), SpO2 98 %.  PHYSICAL EXAMINATION:   GENERAL:  60 y.o.-year-old patient lying in the bed in moderate distress due to neck and head pain, flushed face.  EYES: Pupils equal, round, reactive to light and accommodation. No scleral icterus. Extraocular muscles intact.  HEENT: Head atraumatic, normocephalic. Oropharynx and nasopharynx clear.  NECK:  Supple, no jugular venous distention. No thyroid enlargement, no tenderness.  LUNGS: Normal breath sounds bilaterally, no wheezing, rales,rhonchi or crepitation. No use of accessory muscles of respiration.  CARDIOVASCULAR: S1, S2 normal. No murmurs, rubs, or gallops.  ABDOMEN: Soft, nontender, nondistended. Bowel sounds present. No organomegaly or mass.  EXTREMITIES: No pedal edema, cyanosis, or clubbing.  NEUROLOGIC: Cranial nerves II through XII are intact. Muscle strength 5/5 in all extremities. Sensation intact. Gait not checked. Tenderness to palpation over the neck, extending into the occipital area, restricted range of motion PSYCHIATRIC: The patient is alert and oriented x 3.  SKIN: No obvious rash, lesion, or ulcer.   ORDERS/RESULTS REVIEWED:   CBC  Recent Labs Lab 08/09/15 2013 08/10/15 0547  WBC 6.4 4.3  HGB 15.5 15.1  HCT 45.2 44.9  PLT 134* 111*  MCV 90.8 92.4  MCH 31.0 31.0  MCHC 34.2 33.5  RDW 13.6 13.8   ------------------------------------------------------------------------------------------------------------------  Chemistries   Recent Labs Lab 08/09/15 2013 08/10/15 0547 08/10/15 1111  NA 133* 135  --   K 3.5 3.3*  --   CL 94* 99*  --   CO2 27 28  --   GLUCOSE 96 88  --   BUN 27* 25*  --   CREATININE 1.66* 1.10  --   CALCIUM 9.6 9.4  --  MG  --   --  1.9   ------------------------------------------------------------------------------------------------------------------ estimated  creatinine clearance is 51.8 mL/min (by C-G formula based on Cr of 1.1). ------------------------------------------------------------------------------------------------------------------ No results for input(s): TSH, T4TOTAL, T3FREE, THYROIDAB in the last 72 hours.  Invalid input(s): FREET3  Cardiac Enzymes  Recent Labs Lab 08/09/15 2013 08/10/15 0547 08/10/15 1111  TROPONINI <0.03 <0.03 <0.03   ------------------------------------------------------------------------------------------------------------------ Invalid input(s): POCBNP ---------------------------------------------------------------------------------------------------------------  RADIOLOGY: Dg Chest 2 View  08/09/2015  CLINICAL DATA:  Lightheadedness, shortness of breath. History of hypertension, hepatitis-C and pulmonary embolism. EXAM: CHEST  2 VIEW COMPARISON:  Chest x-rays dated 09/20/2014 and 12/14/2013. FINDINGS: Heart size is normal. Overall cardiomediastinal silhouette is stable in size and configuration. Lungs are hyperexpanded indicating COPD. Lungs are clear. No evidence of pneumonia. No pleural effusion or pneumothorax seen. Osseous and soft tissue structures about the chest are unremarkable. IMPRESSION: 1. Hyperexpanded lungs suggesting COPD/emphysema. 2. Lungs are clear and there is no evidence of acute cardiopulmonary abnormality. No evidence of pneumonia. Electronically Signed   By: Bary Richard M.D.   On: 08/09/2015 20:36   Mr Maxine Glenn Head Wo Contrast  08/10/2015  CLINICAL DATA:  Initial evaluation for acute syncope. EXAM: MRI HEAD WITHOUT AND WITH CONTRAST MRA HEAD WITHOUT CONTRAST MRA NECK WITHOUT AND WITH CONTRAST TECHNIQUE: Multiplanar, multiecho pulse sequences of the brain and surrounding structures were obtained without and with intravenous contrast. Angiographic images of the Circle of Willis were obtained using MRA technique without intravenous contrast. Angiographic images of the neck were obtained using  MRA technique without and with intravenous contrast. Carotid stenosis measurements (when applicable) are obtained utilizing NASCET criteria, using the distal internal carotid diameter as the denominator. CONTRAST:  5mL MULTIHANCE GADOBENATE DIMEGLUMINE 529 MG/ML IV SOLN COMPARISON:  Prior CT from 08/05/2011. FINDINGS: MRI HEAD FINDINGS Cerebral volume within normal limits for patient age. Patchy T2/FLAIR hyperintensity within the periventricular, deep, and subcortical white matter, nonspecific, but likely related to moderate chronic small vessel ischemic disease. No abnormal foci of restricted diffusion to suggest acute infarct. Gray-white matter differentiation maintained. Major intracranial vascular flow voids are preserved. No acute or chronic intracranial hemorrhage. No areas of chronic infarction. No mass lesion, midline shift, or mass effect. No hydrocephalus. No extra-axial fluid collection. Major dural sinuses are patent. No abnormal enhancement. Craniocervical junction normal. Visualized upper cervical spine unremarkable. Pituitary gland within normal limits. No acute abnormality about the orbits. Fluid level within the right max O sinus. Paranasal sinuses are otherwise clear. Small right mastoid effusion. Trace opacity within left mastoid air cells. Inner ear structures normal. Bone marrow signal intensity within normal limits. No scalp soft tissue abnormality. MRA HEAD FINDINGS ANTERIOR CIRCULATION: Distal cervical segments of the internal carotid arteries are widely patent with antegrade flow. Features, cavernous, and supraclinoid segments widely patent without flow-limiting stenosis. A1 segments patent. Left A1 segment slightly hypoplastic, which likely accounts for the slight diminutive size caliber of the left ICA is compared to the right. Possible mild short-segment stenosis within the mid left A1 segment noted. Anterior communicating artery normal. Anterior cerebral arteries well opacified. M1  segments patent without stenosis or occlusion. MCA bifurcations normal. Distal MCA branches symmetric and well perfused. POSTERIOR CIRCULATION: Vertebral arteries patent to the vertebrobasilar junction. Posterior inferior cerebral arteries patent. Basilar artery patent to its distal aspect. Superior cerebral arteries patent bilaterally. Posterior cerebral arteries arise in the basilar artery are well opacified to their distal aspects. Small bilateral posterior communicating arteries noted. No aneurysm or vascular malformation. MRA NECK FINDINGS Visualized portions  of the aortic arch of normal caliber with normal branch pattern. No high-grade stenosis at the origin of the great vessels. Visualized subclavian arteries widely patent. Right common carotid artery widely patent from its origin to the bifurcation. No significant atheromatous disease about the right bifurcation. Right ICA patent from the bifurcation to the skullbase without stenosis, dissection, or occlusion. Left common carotid artery patent from its origin to the bifurcation. No significant atheromatous disease about the left bifurcation. Left ICA patent distally without stenosis, dissection, or occlusion. The left vertebral artery arises separately from the aortic arch. Vertebral arteries appear to be code dominant. Vertebral arteries patent within the neck without stenosis, dissection, or occlusion. No definite soft tissue abnormality identified within the neck. IMPRESSION: 1. No acute intracranial process identified. 2. Moderate chronic microvascular ischemic disease. 3. Normal intracranial MRA. No large or proximal arterial branch occlusion. No high-grade or correctable stenosis. 4. Normal MRA of the neck. No critical or flow-limiting stenosis identified. 5. Acute right maxillary sinusitis. Electronically Signed   By: Rise MuBenjamin  McClintock M.D.   On: 08/10/2015 04:19   Mr Angiogram Neck W Wo Contrast  08/10/2015  CLINICAL DATA:  Initial evaluation  for acute syncope. EXAM: MRI HEAD WITHOUT AND WITH CONTRAST MRA HEAD WITHOUT CONTRAST MRA NECK WITHOUT AND WITH CONTRAST TECHNIQUE: Multiplanar, multiecho pulse sequences of the brain and surrounding structures were obtained without and with intravenous contrast. Angiographic images of the Circle of Willis were obtained using MRA technique without intravenous contrast. Angiographic images of the neck were obtained using MRA technique without and with intravenous contrast. Carotid stenosis measurements (when applicable) are obtained utilizing NASCET criteria, using the distal internal carotid diameter as the denominator. CONTRAST:  5mL MULTIHANCE GADOBENATE DIMEGLUMINE 529 MG/ML IV SOLN COMPARISON:  Prior CT from 08/05/2011. FINDINGS: MRI HEAD FINDINGS Cerebral volume within normal limits for patient age. Patchy T2/FLAIR hyperintensity within the periventricular, deep, and subcortical white matter, nonspecific, but likely related to moderate chronic small vessel ischemic disease. No abnormal foci of restricted diffusion to suggest acute infarct. Gray-white matter differentiation maintained. Major intracranial vascular flow voids are preserved. No acute or chronic intracranial hemorrhage. No areas of chronic infarction. No mass lesion, midline shift, or mass effect. No hydrocephalus. No extra-axial fluid collection. Major dural sinuses are patent. No abnormal enhancement. Craniocervical junction normal. Visualized upper cervical spine unremarkable. Pituitary gland within normal limits. No acute abnormality about the orbits. Fluid level within the right max O sinus. Paranasal sinuses are otherwise clear. Small right mastoid effusion. Trace opacity within left mastoid air cells. Inner ear structures normal. Bone marrow signal intensity within normal limits. No scalp soft tissue abnormality. MRA HEAD FINDINGS ANTERIOR CIRCULATION: Distal cervical segments of the internal carotid arteries are widely patent with antegrade  flow. Features, cavernous, and supraclinoid segments widely patent without flow-limiting stenosis. A1 segments patent. Left A1 segment slightly hypoplastic, which likely accounts for the slight diminutive size caliber of the left ICA is compared to the right. Possible mild short-segment stenosis within the mid left A1 segment noted. Anterior communicating artery normal. Anterior cerebral arteries well opacified. M1 segments patent without stenosis or occlusion. MCA bifurcations normal. Distal MCA branches symmetric and well perfused. POSTERIOR CIRCULATION: Vertebral arteries patent to the vertebrobasilar junction. Posterior inferior cerebral arteries patent. Basilar artery patent to its distal aspect. Superior cerebral arteries patent bilaterally. Posterior cerebral arteries arise in the basilar artery are well opacified to their distal aspects. Small bilateral posterior communicating arteries noted. No aneurysm or vascular malformation. MRA NECK FINDINGS  Visualized portions of the aortic arch of normal caliber with normal branch pattern. No high-grade stenosis at the origin of the great vessels. Visualized subclavian arteries widely patent. Right common carotid artery widely patent from its origin to the bifurcation. No significant atheromatous disease about the right bifurcation. Right ICA patent from the bifurcation to the skullbase without stenosis, dissection, or occlusion. Left common carotid artery patent from its origin to the bifurcation. No significant atheromatous disease about the left bifurcation. Left ICA patent distally without stenosis, dissection, or occlusion. The left vertebral artery arises separately from the aortic arch. Vertebral arteries appear to be code dominant. Vertebral arteries patent within the neck without stenosis, dissection, or occlusion. No definite soft tissue abnormality identified within the neck. IMPRESSION: 1. No acute intracranial process identified. 2. Moderate chronic  microvascular ischemic disease. 3. Normal intracranial MRA. No large or proximal arterial branch occlusion. No high-grade or correctable stenosis. 4. Normal MRA of the neck. No critical or flow-limiting stenosis identified. 5. Acute right maxillary sinusitis. Electronically Signed   By: Rise Mu M.D.   On: 08/10/2015 04:19   Mr Laqueta Jean ZO Contrast  08/10/2015  CLINICAL DATA:  Initial evaluation for acute syncope. EXAM: MRI HEAD WITHOUT AND WITH CONTRAST MRA HEAD WITHOUT CONTRAST MRA NECK WITHOUT AND WITH CONTRAST TECHNIQUE: Multiplanar, multiecho pulse sequences of the brain and surrounding structures were obtained without and with intravenous contrast. Angiographic images of the Circle of Willis were obtained using MRA technique without intravenous contrast. Angiographic images of the neck were obtained using MRA technique without and with intravenous contrast. Carotid stenosis measurements (when applicable) are obtained utilizing NASCET criteria, using the distal internal carotid diameter as the denominator. CONTRAST:  5mL MULTIHANCE GADOBENATE DIMEGLUMINE 529 MG/ML IV SOLN COMPARISON:  Prior CT from 08/05/2011. FINDINGS: MRI HEAD FINDINGS Cerebral volume within normal limits for patient age. Patchy T2/FLAIR hyperintensity within the periventricular, deep, and subcortical white matter, nonspecific, but likely related to moderate chronic small vessel ischemic disease. No abnormal foci of restricted diffusion to suggest acute infarct. Gray-white matter differentiation maintained. Major intracranial vascular flow voids are preserved. No acute or chronic intracranial hemorrhage. No areas of chronic infarction. No mass lesion, midline shift, or mass effect. No hydrocephalus. No extra-axial fluid collection. Major dural sinuses are patent. No abnormal enhancement. Craniocervical junction normal. Visualized upper cervical spine unremarkable. Pituitary gland within normal limits. No acute abnormality about  the orbits. Fluid level within the right max O sinus. Paranasal sinuses are otherwise clear. Small right mastoid effusion. Trace opacity within left mastoid air cells. Inner ear structures normal. Bone marrow signal intensity within normal limits. No scalp soft tissue abnormality. MRA HEAD FINDINGS ANTERIOR CIRCULATION: Distal cervical segments of the internal carotid arteries are widely patent with antegrade flow. Features, cavernous, and supraclinoid segments widely patent without flow-limiting stenosis. A1 segments patent. Left A1 segment slightly hypoplastic, which likely accounts for the slight diminutive size caliber of the left ICA is compared to the right. Possible mild short-segment stenosis within the mid left A1 segment noted. Anterior communicating artery normal. Anterior cerebral arteries well opacified. M1 segments patent without stenosis or occlusion. MCA bifurcations normal. Distal MCA branches symmetric and well perfused. POSTERIOR CIRCULATION: Vertebral arteries patent to the vertebrobasilar junction. Posterior inferior cerebral arteries patent. Basilar artery patent to its distal aspect. Superior cerebral arteries patent bilaterally. Posterior cerebral arteries arise in the basilar artery are well opacified to their distal aspects. Small bilateral posterior communicating arteries noted. No aneurysm or vascular malformation. MRA NECK  FINDINGS Visualized portions of the aortic arch of normal caliber with normal branch pattern. No high-grade stenosis at the origin of the great vessels. Visualized subclavian arteries widely patent. Right common carotid artery widely patent from its origin to the bifurcation. No significant atheromatous disease about the right bifurcation. Right ICA patent from the bifurcation to the skullbase without stenosis, dissection, or occlusion. Left common carotid artery patent from its origin to the bifurcation. No significant atheromatous disease about the left bifurcation.  Left ICA patent distally without stenosis, dissection, or occlusion. The left vertebral artery arises separately from the aortic arch. Vertebral arteries appear to be code dominant. Vertebral arteries patent within the neck without stenosis, dissection, or occlusion. No definite soft tissue abnormality identified within the neck. IMPRESSION: 1. No acute intracranial process identified. 2. Moderate chronic microvascular ischemic disease. 3. Normal intracranial MRA. No large or proximal arterial branch occlusion. No high-grade or correctable stenosis. 4. Normal MRA of the neck. No critical or flow-limiting stenosis identified. 5. Acute right maxillary sinusitis. Electronically Signed   By: Rise Mu M.D.   On: 08/10/2015 04:19   US Carotid Bilateral  08/10/2015  CLINICAL DATA:  Syncope EXAM: BILATERAL CAROTID DUPLEX ULTRASOUND TECHNIQUE: Wallace Cullens scale imaging, color Doppler and duplex ultrasound were performed of bilateral carotid and vertebral arteries in the neck. COMPARISON:  None. FINDINGS: Criteria: Quantification of carotid stenosis is based on velocity parameters that correlate the residual internal carotid diameter with NASCET-based stenosis levels, using the diameter of the distal internal carotid lumen as the denominator for stenosis measurement. The following velocity measurements were obtained: RIGHT ICA:  82 cm/sec CCA:  129 cm/sec SYSTOLIC ICA/CCA RATIO:  0.6 DIASTOLIC ICA/CCA RATIO:  0.9 ECA:  120 cm/sec LEFT ICA:  71 cm/sec CCA:  79 cm/sec SYSTOLIC ICA/CCA RATIO:  0.9 DIASTOLIC ICA/CCA RATIO:  1.8 ECA:  128 cm/sec RIGHT CAROTID ARTERY: Little if any plaque in the bulb. Low resistance internal carotid Doppler pattern. RIGHT VERTEBRAL ARTERY:  Antegrade. LEFT CAROTID ARTERY: Little if any plaque in the bulb. Low resistance internal carotid Doppler pattern. LEFT VERTEBRAL ARTERY:  Antegrade. IMPRESSION: Less than 50% stenosis in the right and left internal carotid arteries. Electronically  Signed   By: Jolaine Click M.D.   On: 08/10/2015 12:13    EKG:  Orders placed or performed during the hospital encounter of 08/10/15  . EKG 12-Lead  . EKG 12-Lead  . ED EKG within 10 minutes  . ED EKG within 10 minutes    ASSESSMENT AND PLAN:  Principal Problem:   Syncope #1 syncope of unclear etiology, but the cellulitis signs unremarkable, as well as carotid ultrasound, less than 50% stenosis, MRA of intra-and extracranial arteries are unremarkable, echo revealed severe LVH, diastolic dysfunction, mild mitral regurgitation #2. Headaches of unclear etiology, appreciate neurologist input, and electroencephalogram to be done tomorrow, MRI of brain, MRA was unremarkable,, suspect and neck related problems, etc. therapy for acute sinusitis which couldn't be called to be contacted for headache #3. Acute sinusitis, initiate patient on Augmentin orally, Flonase #4. Essential hypertension, well-controlled on current medications #5 acute renal insufficiency, resolved with therapy #6. Hypokalemia, supplement intravenously, magnesium level was checked was found to be normal Management plans discussed with the patient, family and they are in agreement.   DRUG ALLERGIES: No Known Allergies  CODE STATUS:     Code Status Orders        Start     Ordered   08/10/15 0527  Full code   Continuous  08/10/15 0526    Code Status History    Date Active Date Inactive Code Status Order ID Comments User Context   05/23/2015  2:54 PM 05/26/2015  8:30 PM Full Code [id number]  Brandy Hale, MD Inpatient      TOTAL TIME TAKING CARE OF THIS PATIENT: 35 minutes.   Discussed with neurologist Rodrickus Min M.D on 08/10/2015 at 4:38 PM  Between 7am to 6pm - Pager - [phone]  After 6pm go to [email or web address] - password [email or web address] [facility]  [facility]  Office  [phone]  CC: Primary care physician; No PCP Per Patient

## 2015-08-10 NOTE — Care Management (Addendum)
RNCM assessment for patient with no insurance. Patient states he is enrolled with [facility]. He drives and needs no assistance with transportation. Lives with his wife and children. No needs identified.
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
## 2015-08-10 NOTE — Progress Notes (Signed)
Patient is still complaining of pain 9/10 after vicodin 2 tablets given. Notified Dr. Winona LegatoVaickute. Per MD okay to give morphine 2mg  IV q4h. Will continue to monitor Juan Wood

## 2015-08-10 NOTE — ED Provider Notes (Signed)
[facility] Emergency Department Provider Note  ____________________________________________  Time seen: 11:45 PM  I have reviewed the triage vital signs and the nursing notes.   HISTORY  Chief Complaint Dizziness and Shortness of Breath      HPI Juan Wood is a 60 y.o. male with history of alcohol abuse hepatitis C and pulmonary emboli presents with 3 syncopal episodes today. Patient states each episode occurred during positional change i.e. going from sitting to standing. Patient states that he was admitted at [facility] in the past for a "heart murmur and was referred to see a cardiologist but did not do so in addition the patient also admits to headache dizziness and right ear tinnitus 3-4 days. Current pain score 7 out of 10. Patient denies any weakness numbness or gait instability. Patient denies any change in speech.     Past Medical History  Diagnosis Date  . Depression   . Hepatitis C   . Alcohol abuse   . Drug abuse   . Hypertension   . Thyroid disease   . Allergy     Hay fever  . Pulmonary embolism (HCC)   . Lower back pain     Patient Active Problem List   Diagnosis Date Noted  . Hepatitis C 05/25/2015  . Alcohol dependence with alcohol-induced mood disorder (HCC)   . 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 Tobacco use disorder 05/23/2015  . Hypothyroidism 05/23/2015  . HTN (hypertension) 05/23/2015  . GERD (gastroesophageal reflux disease) 05/23/2015  . Alcohol abuse with intoxication (HCC) 05/23/2015    Past Surgical History  Procedure Laterality Date  . Hernia repair    . Knee surgery      right    Current Outpatient Rx  Name  Route  Sig  Dispense  Refill  . amLODipine (NORVASC) 5 MG tablet   Oral   Take 2 tablets (10 mg total) by mouth daily.   30 tablet   0   . FLUoxetine (PROZAC)  40 MG capsule   Oral   Take 1 capsule (40 mg total) by mouth daily.   30 capsule   0   . gabapentin (NEURONTIN) 300 MG capsule      TAKE ONE CAPSULE BY MOUTH 3 TIMES A DAY Patient not taking: Reported on 08/09/2015   90 capsule   0     LAST RX WRITTEN BY HOSPITALIST - APRIL 2017   . gabapentin (NEURONTIN) 300 MG capsule   Oral   Take 1 capsule (300 mg total) by mouth 3 (three) times daily.   270 capsule   0   . gabapentin (NEURONTIN) 600 MG tablet   Oral   Take 0.5 tablets (300 mg total) by mouth 3 (three) times daily. Patient not taking: Reported on 08/09/2015   90 tablet   0   . levothyroxine (SYNTHROID, LEVOTHROID) 25 MCG tablet   Oral   Take 1 tablet (25 mcg total) by mouth daily.   30 tablet   0   . omeprazole (PRILOSEC) 20 MG capsule   Oral   Take 1 capsule (20 mg total) by mouth daily.   30 capsule   0   . pantoprazole (PROTONIX) 40 MG tablet   Oral   Take 1 tablet (40 mg total) by mouth daily. Patient not taking: Reported on 08/09/2015   30 tablet   0   . traZODone (  DESYREL) 50 MG tablet   Oral   Take 1-2 tablets (50-100 mg total) by mouth at bedtime as needed for sleep.   30 tablet   0     Allergies Review of patient's allergies indicates no known allergies.  Family History  Problem Relation Age of Onset  . Heart attack Father     x 2  . Cancer Father     Pancreatic  . Heart attack Mother   . Hypertension Mother   . Cancer Mother     Skin    Social History Social History  Substance Use Topics  . Smoking status: Current Every Day Smoker -- 0.50 packs/day for 35 years    Types: Cigarettes  . Smokeless tobacco: None  . Alcohol Use: No     Comment: occasional    Review of Systems  Constitutional: Negative for fever. Eyes: Negative for visual changes. ENT: Negative for sore throat. Cardiovascular: Negative for chest pain. Respiratory: Negative for shortness of breath. Gastrointestinal: Negative for abdominal pain, vomiting and  diarrhea. Genitourinary: Negative for dysuria. Musculoskeletal: Negative for back pain. Skin: Negative for rash. Neurological: Positive headache, dizziness and right ear tinnitus   10-point ROS otherwise negative.  ____________________________________________   PHYSICAL EXAM:  VITAL SIGNS: ED Triage Vitals  Enc Vitals Group     BP 08/09/15 2000 147/92 mmHg     Pulse Rate 08/09/15 2000 73     Resp 08/09/15 2000 18     Temp 08/09/15 2000 98.2 F (36.8 C)     Temp Source 08/09/15 2000 Oral     SpO2 08/09/15 2000 100 %     Weight 08/09/15 2000 118 lb (53.524 kg)     Height 08/09/15 2000 5\' 8"  (1.727 m)     Head Cir --      Peak Flow --      Pain Score 08/09/15 2001 9     Pain Loc --      Pain Edu? --      Excl. in GC? --      Constitutional: Alert and oriented. Well appearing and in no distress. Eyes: Conjunctivae are normal. PERRL. Normal extraocular movements. ENT   Head: Normocephalic and atraumatic.   Nose: No congestion/rhinnorhea.   Mouth/Throat: Mucous membranes are moist.   Neck: No stridor. Hematological/Lymphatic/Immunilogical: No cervical lymphadenopathy. Cardiovascular: Normal rate, regular rhythm. Normal and symmetric distal pulses are present in all extremities.  Respiratory: Normal respiratory effort without tachypnea nor retractions. Breath sounds are clear and equal bilaterally. No wheezes/rales/rhonchi. Gastrointestinal: Soft and nontender. No distention. There is no CVA tenderness. Genitourinary: deferred Musculoskeletal: Nontender with normal range of motion in all extremities. No joint effusions.  No lower extremity tenderness nor edema. Neurologic:  Normal speech and language. No gross focal neurologic deficits are appreciated. Speech is normal.  Skin:  Skin is warm, dry and intact. No rash noted. Psychiatric: Mood and affect are normal. Speech and behavior are normal. Patient exhibits appropriate insight and  judgment.  ____________________________________________    LABS (pertinent positives/negatives)  Labs Reviewed  BASIC METABOLIC PANEL - Abnormal; Notable for the following:    Sodium 133 (*)    Chloride 94 (*)    BUN 27 (*)    Creatinine, Ser 1.66 (*)    GFR calc non Af Amer 43 (*)    GFR calc Af Amer 50 (*)    All other components within normal limits  CBC - Abnormal; Notable for the following:    Platelets 134 (*)  All other components within normal limits  TROPONIN I    ____________________________________________   EKG  ED ECG REPORT I, Glorieta N [place], the attending physician, personally viewed and interpreted this ECG.   Date: 08/10/2015  EKG Time: 8:04 PM  Rate: 74  Rhythm: Normal sinus rhythm  Axis: Normal  Intervals: Normal  ST&T Change: None   ____________________________________________    RADIOLOGY     DG Chest 2 View (Final result) Result time: 08/09/15 20:36:49   Final result by Rad Results In Interface (08/09/15 20:36:49)   Narrative:   CLINICAL DATA: Lightheadedness, shortness of breath. History of hypertension, hepatitis-C and pulmonary embolism.  EXAM: CHEST 2 VIEW  COMPARISON: Chest x-rays dated 09/20/2014 and 12/14/2013.  FINDINGS: Heart size is normal. Overall cardiomediastinal silhouette is stable in size and configuration.  Lungs are hyperexpanded indicating COPD. Lungs are clear. No evidence of pneumonia. No pleural effusion or pneumothorax seen. Osseous and soft tissue structures about the chest are unremarkable.  IMPRESSION: 1. Hyperexpanded lungs suggesting COPD/emphysema. 2. Lungs are clear and there is no evidence of acute cardiopulmonary abnormality. No evidence of pneumonia.   Electronically Signed By: Bary Richard M.D. On: 08/09/2015 20:36      INITIAL IMPRESSION / ASSESSMENT AND PLAN / ED COURSE  Pertinent labs & imaging results that were available during my care of the patient were reviewed  by me and considered in my medical decision making (see chart for details).    ____________________________________________   FINAL CLINICAL IMPRESSION(S) / ED DIAGNOSES  Final diagnoses:  Syncope      Darci Current, MD 08/11/15 [phone]

## 2015-08-10 NOTE — Consult Note (Signed)
Reason for Consult:Headache, syncope Referring Physician: Ether Griffins  CC: Headache, syncope, tinnitus  HPI: Juan Wood is an 60 y.o. male with a known history of Hepatitis C, multisubstance abuse, hypertension, thyroid disease, pulmonary embolism, chronic back pain presented to the emergency room after he passed out twice yesterday. Patient reports having syncopal episodes for the past 4 days that have worsened over the past two days.  No history of any head injury. No complaints of chest pain. No complaints of shortness of breath.  He reports al though they have happened when standing, they mostly occur when he is stooping or bending over.  During this period of time the patient also reports blurry vision and bilateral tinnitus.  Headaches have been present as well.  Described as holocranial and squeezing/throbbing.  Rated at 10/10, now at 8/10. Reports that he takes [facility] powder on an outpatient basis and this has increased recently due to increased pain.  Symptoms have improved somewhat since admission.    Past Medical History  Diagnosis Date  . Depression   . Hepatitis C   . Alcohol abuse   . Drug abuse   . Hypertension   . Thyroid disease   . Allergy     Hay fever  . Pulmonary embolism ([place])   . Lower back pain     Past Surgical History  Procedure Laterality Date  . Hernia repair    . Knee surgery      right    Family History  Problem Relation Age of Onset  . Heart attack Father     x 2  . Cancer Father     Pancreatic  . Heart attack Mother   . Hypertension Mother   . Cancer Mother     Skin    Social History:  reports that he has been smoking Cigarettes.  He has a 17.5 pack-year smoking history. He does not have any smokeless tobacco history on file. He reports that he uses illicit drugs (Cocaine, Opium, and Marijuana). He reports that he does not drink alcohol.  No Known Allergies  Medications:  I have reviewed the patient's current medications. Prior to  Admission:  Prescriptions prior to admission  Medication Sig Dispense Refill Last Dose  . amLODipine (NORVASC) 5 MG tablet Take 2 tablets (10 mg total) by mouth daily. 30 tablet 0 08/09/2015 at Unknown time  . FLUoxetine (PROZAC) 40 MG capsule Take 1 capsule (40 mg total) by mouth daily. 30 capsule 0 08/09/2015 at Unknown time  . gabapentin (NEURONTIN) 300 MG capsule Take 1 capsule (300 mg total) by mouth 3 (three) times daily. 270 capsule 0 08/09/2015 at Unknown time  . levothyroxine (SYNTHROID, LEVOTHROID) 25 MCG tablet Take 1 tablet (25 mcg total) by mouth daily. 30 tablet 0 08/09/2015 at Unknown time  . omeprazole (PRILOSEC) 20 MG capsule Take 1 capsule (20 mg total) by mouth daily. 30 capsule 0 08/09/2015 at Unknown time  . traZODone (DESYREL) 50 MG tablet Take 1-2 tablets (50-100 mg total) by mouth at bedtime as needed for sleep. 30 tablet 0 08/09/2015 at Unknown time   Scheduled: . amLODipine  10 mg Oral Daily  . aspirin EC  81 mg Oral Daily  . enoxaparin (LOVENOX) injection  40 mg Subcutaneous Q24H  . FLUoxetine  40 mg Oral Daily  . gabapentin  300 mg Oral TID  . levothyroxine  25 mcg Oral QAC breakfast  . pantoprazole  40 mg Oral Daily  . sodium chloride flush  3 mL Intravenous  Q12H    ROS: History obtained from the patient  General ROS: negative for - chills, fatigue, fever, night sweats, weight gain or weight loss Psychological ROS: negative for - behavioral disorder, hallucinations, memory difficulties, mood swings or suicidal ideation Ophthalmic ROS: as noted in HPI ENT ROS: as noted in HPI Allergy and Immunology ROS: negative for - hives or itchy/watery eyes Hematological and Lymphatic ROS: negative for - bleeding problems, bruising or swollen lymph nodes Endocrine ROS: negative for - galactorrhea, hair pattern changes, polydipsia/polyuria or temperature intolerance Respiratory ROS: negative for - cough, hemoptysis, shortness of breath or wheezing Cardiovascular ROS: negative  for - chest pain, dyspnea on exertion, edema or irregular heartbeat Gastrointestinal ROS: negative for - abdominal pain, diarrhea, hematemesis, nausea/vomiting or stool incontinence Genito-Urinary ROS: negative for - dysuria, hematuria, incontinence or urinary frequency/urgency Musculoskeletal ROS: joint pain, particularly on the right Neurological ROS: as noted in HPI Dermatological ROS: negative for rash and skin lesion changes  Physical Examination: Blood pressure 102/64, pulse 65, temperature 98.3 F (36.8 C), temperature source Oral, resp. rate 14, height 5' 8"  (1.727 m), weight 51.256 kg (113 lb), SpO2 98 %.  Gen: In NAD.  Cachectic. HEENT-  Normocephalic, no lesions, without obvious abnormality.  Normal external eye and conjunctiva.  Normal TM's bilaterally.  Normal auditory canals and external ears. Normal external nose, mucus membranes and septum.  Normal pharynx. Cardiovascular- S1, S2 normal, pulses palpable throughout   Lungs- chest clear, no wheezing, rales, normal symmetric air entry Abdomen- soft, non-tender; bowel sounds normal; no masses,  no organomegaly Extremities- no edema Lymph-no adenopathy palpable Musculoskeletal-no joint tenderness, deformity or swelling Skin-warm and dry, no hyperpigmentation, vitiligo, or suspicious lesions  Neurological Examination Mental Status: Alert, oriented, thought content appropriate.  Speech fluent without evidence of aphasia.  Able to follow 3 step commands without difficulty. Cranial Nerves: II: Discs flat bilaterally; Visual fields grossly normal, pupils equal, round, reactive to light and accommodation III,IV, VI: ptosis not present, extra-ocular motions intact bilaterally V,VII: smile symmetric, facial light touch sensation normal bilaterally VIII: hearing normal bilaterally IX,X: gag reflex present XI: bilateral shoulder shrug XII: midline tongue extension Motor: Right : Upper extremity   5/5    Left:     Upper extremity    5/5  Lower extremity   5/5     Lower extremity   5/5 Tone and bulk:normal tone throughout; no atrophy noted Sensory: Pinprick and light touch intact throughout, bilaterally Deep Tendon Reflexes: 2+ and symmetric throughout Plantars: Right: downgoing   Left: downgoing Cerebellar: Normal finger-to-nose and normal heel-to-shin testing bilaterally Gait: not tested due to safety concerns   Laboratory Studies:   Basic Metabolic Panel:  Recent Labs Lab 08/09/15 2013 08/10/15 0547 08/10/15 1111  NA 133* 135  --   K 3.5 3.3*  --   CL 94* 99*  --   CO2 27 28  --   GLUCOSE 96 88  --   BUN 27* 25*  --   CREATININE 1.66* 1.10  --   CALCIUM 9.6 9.4  --   MG  --   --  1.9    Liver Function Tests: No results for input(s): AST, ALT, ALKPHOS, BILITOT, PROT, ALBUMIN in the last 168 hours. No results for input(s): LIPASE, AMYLASE in the last 168 hours. No results for input(s): AMMONIA in the last 168 hours.  CBC:  Recent Labs Lab 08/09/15 2013 08/10/15 0547  WBC 6.4 4.3  HGB 15.5 15.1  HCT 45.2 44.9  MCV 90.8  92.4  PLT 134* 111*    Cardiac Enzymes:  Recent Labs Lab 08/09/15 2013 08/10/15 0547 08/10/15 1111  TROPONINI <0.03 <0.03 <0.03    BNP: Invalid input(s): POCBNP  CBG: No results for input(s): GLUCAP in the last 168 hours.  Microbiology: No results found for this or any previous visit.  Coagulation Studies: No results for input(s): LABPROT, INR in the last 72 hours.  Urinalysis: No results for input(s): COLORURINE, LABSPEC, PHURINE, GLUCOSEU, HGBUR, BILIRUBINUR, KETONESUR, PROTEINUR, UROBILINOGEN, NITRITE, LEUKOCYTESUR in the last 168 hours.  Invalid input(s): APPERANCEUR  Lipid Panel:     Component Value Date/Time   CHOL 117 09/16/2014   TRIG 86 09/16/2014   HDL 38 09/16/2014   LDLCALC 62 09/16/2014    HgbA1C: No results found for: HGBA1C  Urine Drug Screen:     Component Value Date/Time   LABOPIA NONE DETECTED 05/22/2015 1649   COCAINSCRNUR  NONE DETECTED 05/22/2015 1649   LABBENZ POSITIVE* 05/22/2015 1649   AMPHETMU NONE DETECTED 05/22/2015 1649   THCU NONE DETECTED 05/22/2015 1649   LABBARB NONE DETECTED 05/22/2015 1649    Alcohol Level: No results for input(s): ETH in the last 168 hours.  Other results: EKG: sinus rhythm at 74 bpm, LAFB.  Imaging: Dg Chest 2 View  08/09/2015  CLINICAL DATA:  Lightheadedness, shortness of breath. History of hypertension, hepatitis-C and pulmonary embolism. EXAM: CHEST  2 VIEW COMPARISON:  Chest x-rays dated 09/20/2014 and 12/14/2013. FINDINGS: Heart size is normal. Overall cardiomediastinal silhouette is stable in size and configuration. Lungs are hyperexpanded indicating COPD. Lungs are clear. No evidence of pneumonia. No pleural effusion or pneumothorax seen. Osseous and soft tissue structures about the chest are unremarkable. IMPRESSION: 1. Hyperexpanded lungs suggesting COPD/emphysema. 2. Lungs are clear and there is no evidence of acute cardiopulmonary abnormality. No evidence of pneumonia. Electronically Signed   By: Franki Cabot M.D.   On: 08/09/2015 20:36   Mr Jodene Nam Head Wo Contrast  08/10/2015  CLINICAL DATA:  Initial evaluation for acute syncope. EXAM: MRI HEAD WITHOUT AND WITH CONTRAST MRA HEAD WITHOUT CONTRAST MRA NECK WITHOUT AND WITH CONTRAST TECHNIQUE: Multiplanar, multiecho pulse sequences of the brain and surrounding structures were obtained without and with intravenous contrast. Angiographic images of the Circle of Willis were obtained using MRA technique without intravenous contrast. Angiographic images of the neck were obtained using MRA technique without and with intravenous contrast. Carotid stenosis measurements (when applicable) are obtained utilizing NASCET criteria, using the distal internal carotid diameter as the denominator. CONTRAST:  39m MULTIHANCE GADOBENATE DIMEGLUMINE 529 MG/ML IV SOLN COMPARISON:  Prior CT from 08/05/2011. FINDINGS: MRI HEAD FINDINGS Cerebral volume  within normal limits for patient age. Patchy T2/FLAIR hyperintensity within the periventricular, deep, and subcortical white matter, nonspecific, but likely related to moderate chronic small vessel ischemic disease. No abnormal foci of restricted diffusion to suggest acute infarct. Gray-white matter differentiation maintained. Major intracranial vascular flow voids are preserved. No acute or chronic intracranial hemorrhage. No areas of chronic infarction. No mass lesion, midline shift, or mass effect. No hydrocephalus. No extra-axial fluid collection. Major dural sinuses are patent. No abnormal enhancement. Craniocervical junction normal. Visualized upper cervical spine unremarkable. Pituitary gland within normal limits. No acute abnormality about the orbits. Fluid level within the right max O sinus. Paranasal sinuses are otherwise clear. Small right mastoid effusion. Trace opacity within left mastoid air cells. Inner ear structures normal. Bone marrow signal intensity within normal limits. No scalp soft tissue abnormality. MRA HEAD FINDINGS ANTERIOR CIRCULATION: Distal cervical  segments of the internal carotid arteries are widely patent with antegrade flow. Features, cavernous, and supraclinoid segments widely patent without flow-limiting stenosis. A1 segments patent. Left A1 segment slightly hypoplastic, which likely accounts for the slight diminutive size caliber of the left ICA is compared to the right. Possible mild short-segment stenosis within the mid left A1 segment noted. Anterior communicating artery normal. Anterior cerebral arteries well opacified. M1 segments patent without stenosis or occlusion. MCA bifurcations normal. Distal MCA branches symmetric and well perfused. POSTERIOR CIRCULATION: Vertebral arteries patent to the vertebrobasilar junction. Posterior inferior cerebral arteries patent. Basilar artery patent to its distal aspect. Superior cerebral arteries patent bilaterally. Posterior cerebral  arteries arise in the basilar artery are well opacified to their distal aspects. Small bilateral posterior communicating arteries noted. No aneurysm or vascular malformation. MRA NECK FINDINGS Visualized portions of the aortic arch of normal caliber with normal branch pattern. No high-grade stenosis at the origin of the great vessels. Visualized subclavian arteries widely patent. Right common carotid artery widely patent from its origin to the bifurcation. No significant atheromatous disease about the right bifurcation. Right ICA patent from the bifurcation to the skullbase without stenosis, dissection, or occlusion. Left common carotid artery patent from its origin to the bifurcation. No significant atheromatous disease about the left bifurcation. Left ICA patent distally without stenosis, dissection, or occlusion. The left vertebral artery arises separately from the aortic arch. Vertebral arteries appear to be code dominant. Vertebral arteries patent within the neck without stenosis, dissection, or occlusion. No definite soft tissue abnormality identified within the neck. IMPRESSION: 1. No acute intracranial process identified. 2. Moderate chronic microvascular ischemic disease. 3. Normal intracranial MRA. No large or proximal arterial branch occlusion. No high-grade or correctable stenosis. 4. Normal MRA of the neck. No critical or flow-limiting stenosis identified. 5. Acute right maxillary sinusitis. Electronically Signed   By: Jeannine Boga M.D.   On: 08/10/2015 04:19   Mr Angiogram Neck W Wo Contrast  08/10/2015  CLINICAL DATA:  Initial evaluation for acute syncope. EXAM: MRI HEAD WITHOUT AND WITH CONTRAST MRA HEAD WITHOUT CONTRAST MRA NECK WITHOUT AND WITH CONTRAST TECHNIQUE: Multiplanar, multiecho pulse sequences of the brain and surrounding structures were obtained without and with intravenous contrast. Angiographic images of the Circle of Willis were obtained using MRA technique without  intravenous contrast. Angiographic images of the neck were obtained using MRA technique without and with intravenous contrast. Carotid stenosis measurements (when applicable) are obtained utilizing NASCET criteria, using the distal internal carotid diameter as the denominator. CONTRAST:  44m MULTIHANCE GADOBENATE DIMEGLUMINE 529 MG/ML IV SOLN COMPARISON:  Prior CT from 08/05/2011. FINDINGS: MRI HEAD FINDINGS Cerebral volume within normal limits for patient age. Patchy T2/FLAIR hyperintensity within the periventricular, deep, and subcortical white matter, nonspecific, but likely related to moderate chronic small vessel ischemic disease. No abnormal foci of restricted diffusion to suggest acute infarct. Gray-white matter differentiation maintained. Major intracranial vascular flow voids are preserved. No acute or chronic intracranial hemorrhage. No areas of chronic infarction. No mass lesion, midline shift, or mass effect. No hydrocephalus. No extra-axial fluid collection. Major dural sinuses are patent. No abnormal enhancement. Craniocervical junction normal. Visualized upper cervical spine unremarkable. Pituitary gland within normal limits. No acute abnormality about the orbits. Fluid level within the right max O sinus. Paranasal sinuses are otherwise clear. Small right mastoid effusion. Trace opacity within left mastoid air cells. Inner ear structures normal. Bone marrow signal intensity within normal limits. No scalp soft tissue abnormality. MRA HEAD FINDINGS ANTERIOR CIRCULATION:  Distal cervical segments of the internal carotid arteries are widely patent with antegrade flow. Features, cavernous, and supraclinoid segments widely patent without flow-limiting stenosis. A1 segments patent. Left A1 segment slightly hypoplastic, which likely accounts for the slight diminutive size caliber of the left ICA is compared to the right. Possible mild short-segment stenosis within the mid left A1 segment noted. Anterior  communicating artery normal. Anterior cerebral arteries well opacified. M1 segments patent without stenosis or occlusion. MCA bifurcations normal. Distal MCA branches symmetric and well perfused. POSTERIOR CIRCULATION: Vertebral arteries patent to the vertebrobasilar junction. Posterior inferior cerebral arteries patent. Basilar artery patent to its distal aspect. Superior cerebral arteries patent bilaterally. Posterior cerebral arteries arise in the basilar artery are well opacified to their distal aspects. Small bilateral posterior communicating arteries noted. No aneurysm or vascular malformation. MRA NECK FINDINGS Visualized portions of the aortic arch of normal caliber with normal branch pattern. No high-grade stenosis at the origin of the great vessels. Visualized subclavian arteries widely patent. Right common carotid artery widely patent from its origin to the bifurcation. No significant atheromatous disease about the right bifurcation. Right ICA patent from the bifurcation to the skullbase without stenosis, dissection, or occlusion. Left common carotid artery patent from its origin to the bifurcation. No significant atheromatous disease about the left bifurcation. Left ICA patent distally without stenosis, dissection, or occlusion. The left vertebral artery arises separately from the aortic arch. Vertebral arteries appear to be code dominant. Vertebral arteries patent within the neck without stenosis, dissection, or occlusion. No definite soft tissue abnormality identified within the neck. IMPRESSION: 1. No acute intracranial process identified. 2. Moderate chronic microvascular ischemic disease. 3. Normal intracranial MRA. No large or proximal arterial branch occlusion. No high-grade or correctable stenosis. 4. Normal MRA of the neck. No critical or flow-limiting stenosis identified. 5. Acute right maxillary sinusitis. Electronically Signed   By: Jeannine Boga M.D.   On: 08/10/2015 04:19   Mr  Jeri Cos BO Contrast  08/10/2015  CLINICAL DATA:  Initial evaluation for acute syncope. EXAM: MRI HEAD WITHOUT AND WITH CONTRAST MRA HEAD WITHOUT CONTRAST MRA NECK WITHOUT AND WITH CONTRAST TECHNIQUE: Multiplanar, multiecho pulse sequences of the brain and surrounding structures were obtained without and with intravenous contrast. Angiographic images of the Circle of Willis were obtained using MRA technique without intravenous contrast. Angiographic images of the neck were obtained using MRA technique without and with intravenous contrast. Carotid stenosis measurements (when applicable) are obtained utilizing NASCET criteria, using the distal internal carotid diameter as the denominator. CONTRAST:  31m MULTIHANCE GADOBENATE DIMEGLUMINE 529 MG/ML IV SOLN COMPARISON:  Prior CT from 08/05/2011. FINDINGS: MRI HEAD FINDINGS Cerebral volume within normal limits for patient age. Patchy T2/FLAIR hyperintensity within the periventricular, deep, and subcortical white matter, nonspecific, but likely related to moderate chronic small vessel ischemic disease. No abnormal foci of restricted diffusion to suggest acute infarct. Gray-white matter differentiation maintained. Major intracranial vascular flow voids are preserved. No acute or chronic intracranial hemorrhage. No areas of chronic infarction. No mass lesion, midline shift, or mass effect. No hydrocephalus. No extra-axial fluid collection. Major dural sinuses are patent. No abnormal enhancement. Craniocervical junction normal. Visualized upper cervical spine unremarkable. Pituitary gland within normal limits. No acute abnormality about the orbits. Fluid level within the right max O sinus. Paranasal sinuses are otherwise clear. Small right mastoid effusion. Trace opacity within left mastoid air cells. Inner ear structures normal. Bone marrow signal intensity within normal limits. No scalp soft tissue abnormality. MRA HEAD FINDINGS ANTERIOR  CIRCULATION: Distal cervical  segments of the internal carotid arteries are widely patent with antegrade flow. Features, cavernous, and supraclinoid segments widely patent without flow-limiting stenosis. A1 segments patent. Left A1 segment slightly hypoplastic, which likely accounts for the slight diminutive size caliber of the left ICA is compared to the right. Possible mild short-segment stenosis within the mid left A1 segment noted. Anterior communicating artery normal. Anterior cerebral arteries well opacified. M1 segments patent without stenosis or occlusion. MCA bifurcations normal. Distal MCA branches symmetric and well perfused. POSTERIOR CIRCULATION: Vertebral arteries patent to the vertebrobasilar junction. Posterior inferior cerebral arteries patent. Basilar artery patent to its distal aspect. Superior cerebral arteries patent bilaterally. Posterior cerebral arteries arise in the basilar artery are well opacified to their distal aspects. Small bilateral posterior communicating arteries noted. No aneurysm or vascular malformation. MRA NECK FINDINGS Visualized portions of the aortic arch of normal caliber with normal branch pattern. No high-grade stenosis at the origin of the great vessels. Visualized subclavian arteries widely patent. Right common carotid artery widely patent from its origin to the bifurcation. No significant atheromatous disease about the right bifurcation. Right ICA patent from the bifurcation to the skullbase without stenosis, dissection, or occlusion. Left common carotid artery patent from its origin to the bifurcation. No significant atheromatous disease about the left bifurcation. Left ICA patent distally without stenosis, dissection, or occlusion. The left vertebral artery arises separately from the aortic arch. Vertebral arteries appear to be code dominant. Vertebral arteries patent within the neck without stenosis, dissection, or occlusion. No definite soft tissue abnormality identified within the neck.  IMPRESSION: 1. No acute intracranial process identified. 2. Moderate chronic microvascular ischemic disease. 3. Normal intracranial MRA. No large or proximal arterial branch occlusion. No high-grade or correctable stenosis. 4. Normal MRA of the neck. No critical or flow-limiting stenosis identified. 5. Acute right maxillary sinusitis. Electronically Signed   By: Jeannine Boga M.D.   On: 08/10/2015 04:19   US Carotid Bilateral  08/10/2015  CLINICAL DATA:  Syncope EXAM: BILATERAL CAROTID DUPLEX ULTRASOUND TECHNIQUE: Pearline Cables scale imaging, color Doppler and duplex ultrasound were performed of bilateral carotid and vertebral arteries in the neck. COMPARISON:  None. FINDINGS: Criteria: Quantification of carotid stenosis is based on velocity parameters that correlate the residual internal carotid diameter with NASCET-based stenosis levels, using the diameter of the distal internal carotid lumen as the denominator for stenosis measurement. The following velocity measurements were obtained: RIGHT ICA:  82 cm/sec CCA:  854 cm/sec SYSTOLIC ICA/CCA RATIO:  0.6 DIASTOLIC ICA/CCA RATIO:  0.9 ECA:  120 cm/sec LEFT ICA:  71 cm/sec CCA:  79 cm/sec SYSTOLIC ICA/CCA RATIO:  0.9 DIASTOLIC ICA/CCA RATIO:  1.8 ECA:  128 cm/sec RIGHT CAROTID ARTERY: Little if any plaque in the bulb. Low resistance internal carotid Doppler pattern. RIGHT VERTEBRAL ARTERY:  Antegrade. LEFT CAROTID ARTERY: Little if any plaque in the bulb. Low resistance internal carotid Doppler pattern. LEFT VERTEBRAL ARTERY:  Antegrade. IMPRESSION: Less than 50% stenosis in the right and left internal carotid arteries. Electronically Signed   By: Marybelle Killings M.D.   On: 08/10/2015 12:13     Assessment/Plan: 60 year old male presenting with complaints of syncope, dizziness, blurry vision, tinnitus and headache.  MRI and MR of the brain personally reviewed and show no acute changes.  No evidence of significant stenosis either.  Patient is not orthostatic.  Has  been increasing ASA dose in the clinical setting of worsening renal function.  This may be contributing to HA and tinnitus.  MRI does show some sinus disease as well.  With syncope occurring with bending and stooping would rule out a cardiac cause.  Recommendations: 1.  Limit ASA use 2.  Agree with addressing renal function, this may improve many of his complaints.   3.  ESR, CRP and hepatic profile 4.  EEG 5.  May benefit from cardiology evaluation 6.  ENT evaluation as an outpatient   Alexis Goodell, MD Neurology [phone] 08/10/2015, 4:14 PM

## 2015-08-10 NOTE — Progress Notes (Signed)
Initial Nutrition Assessment  DOCUMENTATION CODES:   Underweight  INTERVENTION:  -Recommend Ensure Enlive po BID, each supplement provides 350 kcal and 20 grams of protein. Recommend pt continue as outpatient, provided patient with Ensure coupons. Also discussed other options for nutritional supplementation -Recommend snacks between meals, will send 8pm bedtime snack   NUTRITION DIAGNOSIS:   Unintentional weight loss related to acute illness as evidenced by percent weight loss, per patient/family report.  GOAL:   Patient will meet greater than or equal to 90% of their needs  MONITOR:   PO intake, Supplement acceptance, Weight trends  REASON FOR ASSESSMENT:   Consult Assessment of nutrition requirement/status  ASSESSMENT:    60 yo male admitted with syncope and collapse. Pt reports appetite has been good, eating well. Eats 3 meals per day; 2 eggs, bacon and toast at breakfast, salad or sandwich at lunch and meat with veggies/potato for dinner. Pt likes Boost but has not been able to afford recently. Pt reports 15 pound wt loss in 3-4 weeks; prior to this pt reports he has weighed the same amount since high school.   Past Medical History  Diagnosis Date  . Depression   . Hepatitis C   . Alcohol abuse   . Drug abuse   . Hypertension   . Thyroid disease   . Allergy     Hay fever  . Pulmonary embolism (HCC)   . Lower back pain     Diet Order:  Diet Heart Room service appropriate?: Yes; Fluid consistency:: Thin   Energy Intake: pt eating 100% of meal trays  Labs and Meds reviewed  Nutrition-Focused physical exam completed. Findings are normal fat depletion, mild muscle depletion, and no edema.    Height:   Ht Readings from Last 1 Encounters:  08/09/15 5\' 8"  (1.727 m)    Weight:  UBW 130-135 pounds; >/=13% wt loss in 3-4 weeks  Wt Readings from Last 1 Encounters:  08/10/15 113 lb (51.256 kg)    Ideal Body Weight:     BMI:  Body mass index is 17.19  kg/(m^2).  Estimated Nutritional Needs:   Kcal:  2000-2200 kcals   Protein:  61-73 g  Fluid:  >/= 2 L   EDUCATION NEEDS:   Education needs addressed  Romelle Starcherate Janie Capp MS, RD, LDN [phone] Pager  [phone] Weekend/On-Call Pager

## 2015-08-10 NOTE — Progress Notes (Signed)
Admitted from ED. Wife at side. A&O. Dizzy when standing up. IV fluids started. Admits pain, received pain meds just prior to leaving ED. Skin verified with Yaakov GuthrieMarcel.

## 2015-08-10 NOTE — H&P (Signed)
[facility] -  at [facility]   PATIENT NAME: Juan Wood    MR#:  [id number]  DATE OF BIRTH:  11/16/1955  DATE OF ADMISSION:  08/10/2015  PRIMARY CARE PHYSICIAN: No PCP Per Patient   REQUESTING/REFERRING PHYSICIAN:   CHIEF COMPLAINT:   Chief Complaint  Patient presents with  . Dizziness  . Shortness of Breath    HISTORY OF PRESENT ILLNESS: Juan Wood  is a 60 y.o. male with a known history of Hepatitis C, alcohol abuse, hypertension, thyroid disease, pulmonary embolism, chronic back pain presented to the emergency room after he passed out twice yesterday. Patient also passed out twice day before yesterday too. No history of any head injury. No complaints of chest pain. No complaints of shortness of breath. Patient felt dizzy before he passed out. He also had nausea treated during those episodes. Troponin was negative during the workup in the emergency room. Patient also had an MRI and MRA brain done in the emergency room pending results. No history of any seizure. Patient complains of headache since one day which is pounding in nature. No history of any blurry vision. Also has ringing sensation in the right ear since last couple of days.  PAST MEDICAL HISTORY:   Past Medical History  Diagnosis Date  . Depression   . Hepatitis C   . Alcohol abuse   . Drug abuse   . Hypertension   . Thyroid disease   . Allergy     Hay fever  . Pulmonary embolism (HCC)   . Lower back pain     PAST SURGICAL HISTORY: Past Surgical History  Procedure Laterality Date  . Hernia repair    . Knee surgery      right    SOCIAL HISTORY:  Social History  Substance Use Topics  . Smoking status: Current Every Day Smoker -- 0.50 packs/day for 35 years    Types: Cigarettes  . Smokeless tobacco: Not on file  . Alcohol Use: No     Comment: occasional    FAMILY HISTORY:  Family History  Problem Relation Age of Onset  . Heart attack Father     x 2  . Cancer  Father     Pancreatic  . Heart attack Mother   . Hypertension Mother   . Cancer Mother     Skin    DRUG ALLERGIES: No Known Allergies  REVIEW OF SYSTEMS:   CONSTITUTIONAL: No fever, fatigue or weakness.  EYES: No blurred or double vision.  EARS, NOSE, AND THROAT: Ringing sensation in the right ear. RESPIRATORY: No cough, shortness of breath, wheezing or hemoptysis.  CARDIOVASCULAR: No chest pain, orthopnea, edema.  GASTROINTESTINAL: No nausea, vomiting, diarrhea or abdominal pain.  GENITOURINARY: No dysuria, hematuria.  ENDOCRINE: No polyuria, nocturia,  HEMATOLOGY: No anemia, easy bruising or bleeding SKIN: No rash or lesion. MUSCULOSKELETAL: No joint pain or arthritis.   NEUROLOGIC: No tingling, numbness, weakness. Has headache PSYCHIATRY: No anxiety or depression.   MEDICATIONS AT HOME:  Prior to Admission medications   Medication Sig Start Date End Date Taking? Authorizing Provider  amLODipine (NORVASC) 5 MG tablet Take 2 tablets (10 mg total) by mouth daily. 05/26/15  Yes Jolanta B Pucilowska, MD  FLUoxetine (PROZAC) 40 MG capsule Take 1 capsule (40 mg total) by mouth daily. 05/26/15  Yes Jolanta B Pucilowska, MD  gabapentin (NEURONTIN) 300 MG capsule Take 1 capsule (300 mg total) by mouth 3 (three) times daily. 08/09/15  Yes Harle Battiest, PA-C  levothyroxine (SYNTHROID, LEVOTHROID) 25 MCG tablet Take 1 tablet (25 mcg total) by mouth daily. 05/26/15  Yes Jolanta B Pucilowska, MD  omeprazole (PRILOSEC) 20 MG capsule Take 1 capsule (20 mg total) by mouth daily. 05/26/15  Yes Jolanta B Pucilowska, MD  traZODone (DESYREL) 50 MG tablet Take 1-2 tablets (50-100 mg total) by mouth at bedtime as needed for sleep. 05/26/15  Yes Jolanta B Pucilowska, MD      PHYSICAL EXAMINATION:   VITAL SIGNS: Blood pressure 129/85, pulse 58, temperature 98.2 F (36.8 C), temperature source Oral, resp. rate 16, height 5\' 8"  (1.727 m), weight 53.524 kg (118 lb), SpO2 99 %.  GENERAL:  60  y.o.-year-old patient lying in the bed with no acute distress.  EYES: Pupils equal, round, reactive to light and accommodation. No scleral icterus. Extraocular muscles intact.  HEENT: Head atraumatic, normocephalic. Oropharynx and nasopharynx clear.  NECK:  Supple, no jugular venous distention. No thyroid enlargement, no tenderness.  LUNGS: Normal breath sounds bilaterally, no wheezing, rales,rhonchi or crepitation. No use of accessory muscles of respiration.  CARDIOVASCULAR: S1, S2 normal. No murmurs, rubs, or gallops.  ABDOMEN: Soft, nontender, nondistended. Bowel sounds present. No organomegaly or mass.  EXTREMITIES: No pedal edema, cyanosis, or clubbing.  NEUROLOGIC: Cranial nerves II through XII are intact. Muscle strength 5/5 in all extremities. Sensation intact. Gait not checked.  PSYCHIATRIC: The patient is alert and oriented x 3.  SKIN: No obvious rash, lesion, or ulcer.   LABORATORY PANEL:   CBC  Recent Labs Lab 08/09/15 2013  WBC 6.4  HGB 15.5  HCT 45.2  PLT 134*  MCV 90.8  MCH 31.0  MCHC 34.2  RDW 13.6   ------------------------------------------------------------------------------------------------------------------  Chemistries   Recent Labs Lab 08/09/15 2013  NA 133*  K 3.5  CL 94*  CO2 27  GLUCOSE 96  BUN 27*  CREATININE 1.66*  CALCIUM 9.6   ------------------------------------------------------------------------------------------------------------------ estimated creatinine clearance is 35.8 mL/min (by C-G formula based on Cr of 1.66). ------------------------------------------------------------------------------------------------------------------ No results for input(s): TSH, T4TOTAL, T3FREE, THYROIDAB in the last 72 hours.  Invalid input(s): FREET3   Coagulation profile No results for input(s): INR, PROTIME in the last 168  hours. ------------------------------------------------------------------------------------------------------------------- No results for input(s): DDIMER in the last 72 hours. -------------------------------------------------------------------------------------------------------------------  Cardiac Enzymes  Recent Labs Lab 08/09/15 2013  TROPONINI <0.03   ------------------------------------------------------------------------------------------------------------------ Invalid input(s): POCBNP  ---------------------------------------------------------------------------------------------------------------  Urinalysis    Component Value Date/Time   COLORURINE STRAW* 04/27/2015 1830   COLORURINE Straw 04/04/2014 2212   APPEARANCEUR CLEAR* 04/27/2015 1830   APPEARANCEUR Clear 04/04/2014 2212   LABSPEC 1.009 04/27/2015 1830   LABSPEC 1.003 04/04/2014 2212   PHURINE 5.0 04/27/2015 1830   PHURINE 5.0 04/04/2014 2212   GLUCOSEU NEGATIVE 04/27/2015 1830   GLUCOSEU Negative 04/04/2014 2212   HGBUR NEGATIVE 04/27/2015 1830   HGBUR Negative 04/04/2014 2212   BILIRUBINUR NEGATIVE 04/27/2015 1830   BILIRUBINUR Negative 04/04/2014 2212   KETONESUR NEGATIVE 04/27/2015 1830   KETONESUR Negative 04/04/2014 2212   PROTEINUR NEGATIVE 04/27/2015 1830   PROTEINUR Negative 04/04/2014 2212   NITRITE NEGATIVE 04/27/2015 1830   NITRITE Negative 04/04/2014 2212   LEUKOCYTESUR NEGATIVE 04/27/2015 1830   LEUKOCYTESUR Negative 04/04/2014 2212     RADIOLOGY: Dg Chest 2 View  08/09/2015  CLINICAL DATA:  Lightheadedness, shortness of breath. History of hypertension, hepatitis-C and pulmonary embolism. EXAM: CHEST  2 VIEW COMPARISON:  Chest x-rays dated 09/20/2014 and 12/14/2013. FINDINGS: Heart size is normal. Overall cardiomediastinal silhouette is stable in size and configuration. Lungs  are hyperexpanded indicating COPD. Lungs are clear. No evidence of pneumonia. No pleural effusion or pneumothorax  seen. Osseous and soft tissue structures about the chest are unremarkable. IMPRESSION: 1. Hyperexpanded lungs suggesting COPD/emphysema. 2. Lungs are clear and there is no evidence of acute cardiopulmonary abnormality. No evidence of pneumonia. Electronically Signed   By: Bary RichardStan  Maynard M.D.   On: 08/09/2015 20:36    EKG: Orders placed or performed during the hospital encounter of 08/10/15  . EKG 12-Lead  . EKG 12-Lead  . ED EKG within 10 minutes  . ED EKG within 10 minutes    IMPRESSION AND PLAN: 60 year old male patient with history of hepatitis C, hypertension, hypothyroidism ,back pain presented to the emergency room after he passed out couple of times. Admitting diagnosis 1. Syncope and collapse 2. Hypertension 3. Chronic hepatitis C 4 hypothyroidism Treatment plan. Admit patient to telemetry observation bed Follow-up MRI and MRA brain results Check carotid ultrasound Check echocardiogram Cycle troponin to rule out ischemia Supportive care.  All the records are reviewed and case discussed with ED provider. Management plans discussed with the patient, family and they are in agreement.  CODE STATUS:FULL Code Status History    Date Active Date Inactive Code Status Order ID Comments User Context   05/23/2015  2:54 PM 05/26/2015  8:30 PM Full Code [id number]  Brandy HaleUzma Faheem, MD Inpatient       TOTAL TIME TAKING CARE OF THIS PATIENT: 52 minutes.    Ihor AustinPavan Pyreddy M.D on 08/10/2015 at 4:11 AM  Between 7am to 6pm - Pager - [phone]  After 6pm go to [email or web address] - password [email or web address] [facility]  [facility]  Office  [phone]  CC: Primary care physician; No PCP Per Patient

## 2015-08-10 NOTE — ED Notes (Signed)
Patient transported to MRI 

## 2015-08-10 NOTE — ED Notes (Signed)
Pt in via triage w/ complaints of weakness, dizziness, syncopal episodes x 3 over the last 2 days, ringing in his ears; symptoms persistent over the last four days.  Pt reports [facility] and advised to come into ER.  Pt A/Ox4, hypertensive upon arrival, other vitals WDL.  [facility] with [facility] heart murmur but did not follow up with cardiology once he was discharged.  MD at bedside.

## 2015-08-11 ENCOUNTER — Observation Stay: Payer: PRIVATE HEALTH INSURANCE

## 2015-08-11 DIAGNOSIS — I10 Essential (primary) hypertension: Secondary | ICD-10-CM

## 2015-08-11 DIAGNOSIS — D696 Thrombocytopenia, unspecified: Secondary | ICD-10-CM

## 2015-08-11 DIAGNOSIS — M542 Cervicalgia: Secondary | ICD-10-CM

## 2015-08-11 DIAGNOSIS — R740 Nonspecific elevation of levels of transaminase and lactic acid dehydrogenase [LDH]: Secondary | ICD-10-CM

## 2015-08-11 DIAGNOSIS — I517 Cardiomegaly: Secondary | ICD-10-CM

## 2015-08-11 DIAGNOSIS — R55 Syncope and collapse: Secondary | ICD-10-CM | POA: Diagnosis not present

## 2015-08-11 DIAGNOSIS — E860 Dehydration: Secondary | ICD-10-CM

## 2015-08-11 DIAGNOSIS — R51 Headache: Secondary | ICD-10-CM

## 2015-08-11 DIAGNOSIS — J0100 Acute maxillary sinusitis, unspecified: Secondary | ICD-10-CM

## 2015-08-11 DIAGNOSIS — R519 Headache, unspecified: Secondary | ICD-10-CM

## 2015-08-11 DIAGNOSIS — N289 Disorder of kidney and ureter, unspecified: Secondary | ICD-10-CM

## 2015-08-11 DIAGNOSIS — R7401 Elevation of levels of liver transaminase levels: Secondary | ICD-10-CM

## 2015-08-11 DIAGNOSIS — E876 Hypokalemia: Secondary | ICD-10-CM

## 2015-08-11 LAB — PLATELET COUNT: PLATELETS: 95 10*3/uL — AB (ref 150–440)

## 2015-08-11 MED ORDER — TRAZODONE HCL 50 MG PO TABS: 50.0000 mg | ORAL_TABLET | Freq: Every evening | ORAL | Status: DC | PRN

## 2015-08-11 MED ORDER — FLUTICASONE PROPIONATE 50 MCG/ACT NA SUSP: 2.0000 | Freq: Every day | NASAL | Status: DC

## 2015-08-11 MED ORDER — GABAPENTIN 300 MG PO CAPS: 300.0000 mg | ORAL_CAPSULE | Freq: Three times a day (TID) | ORAL | Status: DC

## 2015-08-11 MED ORDER — AMOXICILLIN-POT CLAVULANATE 875-125 MG PO TABS: 1.0000 | ORAL_TABLET | Freq: Two times a day (BID) | ORAL | Status: DC

## 2015-08-11 MED ORDER — ORPHENADRINE CITRATE ER 100 MG PO TB12: 100.0000 mg | ORAL_TABLET | Freq: Two times a day (BID) | ORAL | Status: DC

## 2015-08-11 MED ORDER — AMLODIPINE BESYLATE 10 MG PO TABS: 10.0000 mg | ORAL_TABLET | Freq: Every day | ORAL | Status: DC

## 2015-08-11 MED ORDER — FLUOXETINE HCL 40 MG PO CAPS: 40.0000 mg | ORAL_CAPSULE | Freq: Every day | ORAL | Status: DC

## 2015-08-11 MED ORDER — LEVOTHYROXINE SODIUM 25 MCG PO TABS: 25.0000 ug | ORAL_TABLET | Freq: Every day | ORAL | Status: DC

## 2015-08-11 MED ORDER — HYDROCODONE-ACETAMINOPHEN 5-325 MG PO TABS: 1.0000 | ORAL_TABLET | ORAL | Status: DC | PRN

## 2015-08-11 MED ORDER — OMEPRAZOLE 20 MG PO CPDR: 20.0000 mg | DELAYED_RELEASE_CAPSULE | Freq: Every day | ORAL | Status: DC

## 2015-08-11 NOTE — Progress Notes (Signed)
Page out to Dr. Thad Rangereynolds re: EEG to be read, pt pending discharge. She stated it would be read today

## 2015-08-11 NOTE — Care Management (Signed)
Patient to discharge today.  Self pay patient.  Per patient active with [facility] and receives his medications from [facility].   MD has printed his prescriptions for discharge.  I have faxed them to medication management and informed the patient to take the hard copies with him to get them filled at discharge. RNCM signing off

## 2015-08-11 NOTE — Discharge Summary (Signed)
[facility] [place] NAME: Juan Wood    MR#:  [id number]  DATE OF BIRTH:  February 21, 1956  DATE OF ADMISSION:  08/10/2015 ADMITTING PHYSICIAN: Saundra Shelling, MD  DATE OF DISCHARGE: No discharge date for patient encounter.  PRIMARY CARE PHYSICIAN: No PCP Per Patient     ADMISSION DIAGNOSIS:  Syncope [R55]  DISCHARGE DIAGNOSIS:  Principal Problem:   Syncope Active Problems:   Acute maxillary sinusitis   Headache   Neck pain   LVH (left ventricular hypertrophy)   Acute renal insufficiency   Dehydration   Essential hypertension   Hypokalemia   Elevated transaminase level   Thrombocytopenia (HCC)   SECONDARY DIAGNOSIS:   Past Medical History  Diagnosis Date  . Depression   . Hepatitis C   . Alcohol abuse   . Drug abuse   . Hypertension   . Thyroid disease   . Allergy     Hay fever  . Pulmonary embolism ([place])   . Lower back pain     .pro HOSPITAL COURSE:  Patient is a 60 year old Caucasian male with past. No history significant for history of depression, hepatitis C, alcohol abuse, drug abuse, essential hypertension, pulmonary embolism, low back pains who presents to the hospital with complaints of frequent syncopal episodes, severe headache, which is been having for the past few months, worsening over the past 3 days, mostly in the neck area, posterior Wood, pounding in nature with some blurring of vision. Also ringing in the right ear. Patient was admitted to the hospital for syncope evaluation, he underwent MRA of Wood and neck which showed no acute intracranial process, moderate chronic microvascular ischemic changes, no large proximal arterial branch occlusions, normal MRA of the neck. Acute right maxillary sinusitis was also noted. Patient was seen by neurologist, who recommended to limit aspirin use, get an electroencephalogram, ESR, CRP, and hepatic profile, ENT and cardiology evaluation. Patient had echocardiogram  done while in the hospital revealing severe LVH, normal ejection fraction, mild mitral valve regurgitation, grade 1 diastolic dysfunction. Patient had ultrasound of his carotid arteries, which was unremarkable, revealing less than 50% stenosis and bilateral internal carotid arteries. Patient was given antibiotic therapy for sinusitis, pain medications, and IV fluids, and his condition improved, as well as his lab data, his kidney function normalized. His potassium was supplemented. Patient had liver enzymes checked, which revealed AST and ALT 99 and 141, respectively, his platelet count was also found to be low and diminished as time progressed, signifying bone marrow abnormality, likely alcohol-related. Sedimentation rate was normal at 6. CPR was less than 0.5. Electroencephalogram results are still pending. Patient has improved significantly and he is ready to be discharged home.  Discussion by problem: #1 syncope of uncertain etiology at present, rule out cardiac issues, orthostatic vital signs were checked and were unremarkable, as well as carotid ultrasound, less than 50% stenosis, MRA of intra-and extracranial arteries are unremarkable, echo revealed severe LVH, diastolic dysfunction, mild mitral regurgitation, patient is to follow up with cardiologist as outpatient for further evaluation for possible hypertrophic cardiomyopathy. #2. Headache, appreciate neurologist input, electroencephalogram results are pending, MRI of brain, MRA was unremarkable, patient was treated for acute maxillary sinusitis and his headache somewhat improved, continue Augmentin to complete course. Also suspect neck related problems, try muscle relaxants, follow-up with primary care physician for further recommendations. No significant abnormalities were found on MRI of the neck #3. Acute sinusitis, continue patient on Augmentin orally, Flonase #4. Essential hypertension,  well-controlled on current medications #5  acute renal  insufficiency, resolved with therapy #6. Hypokalemia, supplemented intravenously, magnesium level was checked was found to be normal #7. Elevated transaminases level, likely due to alcoholism, hepatitis c #8. Thrombocytopenia, chronic, follow-up as outpatient closely  DISCHARGE CONDITIONS:   Stable  CONSULTS OBTAINED:  Treatment Team:  Catarina Hartshorn, MD  DRUG ALLERGIES:  No Known Allergies  DISCHARGE MEDICATIONS:   Current Discharge Medication List    START taking these medications   Details  amoxicillin-clavulanate (AUGMENTIN) 875-125 MG tablet Take 1 tablet by mouth every 12 (twelve) hours. Qty: 18 tablet, Refills: 0    fluticasone (FLONASE) 50 MCG/ACT nasal spray Place 2 sprays into both nostrils daily. Qty: 9.9 g, Refills: 2    HYDROcodone-acetaminophen (NORCO/VICODIN) 5-325 MG tablet Take 1-2 tablets by mouth every 4 (four) hours as needed for moderate pain. Qty: 30 tablet, Refills: 0    orphenadrine (NORFLEX) 100 MG tablet Take 1 tablet (100 mg total) by mouth 2 (two) times daily. Qty: 30 tablet, Refills: 2      CONTINUE these medications which have CHANGED   Details  amLODipine (NORVASC) 10 MG tablet Take 1 tablet (10 mg total) by mouth daily. Qty: 30 tablet, Refills: 5      CONTINUE these medications which have NOT CHANGED   Details  FLUoxetine (PROZAC) 40 MG capsule Take 1 capsule (40 mg total) by mouth daily. Qty: 30 capsule, Refills: 0    gabapentin (NEURONTIN) 300 MG capsule Take 1 capsule (300 mg total) by mouth 3 (three) times daily. Qty: 270 capsule, Refills: 0    levothyroxine (SYNTHROID, LEVOTHROID) 25 MCG tablet Take 1 tablet (25 mcg total) by mouth daily. Qty: 30 tablet, Refills: 0    omeprazole (PRILOSEC) 20 MG capsule Take 1 capsule (20 mg total) by mouth daily. Qty: 30 capsule, Refills: 0    traZODone (DESYREL) 50 MG tablet Take 1-2 tablets (50-100 mg total) by mouth at bedtime as needed for sleep. Qty: 30 tablet, Refills: 0          DISCHARGE INSTRUCTIONS:    Patient is to follow-up with primary care physician and cardiologist as outpatient  If you experience worsening of your admission symptoms, develop shortness of breath, life threatening emergency, suicidal or homicidal thoughts you must seek medical attention immediately by calling 911 or calling your MD immediately  if symptoms less severe.  You Must read complete instructions/literature along with all the possible adverse reactions/side effects for all the Medicines you take and that have been prescribed to you. Take any new Medicines after you have completely understood and accept all the possible adverse reactions/side effects.   Please note  You were cared for by a hospitalist during your hospital stay. If you have any questions about your discharge medications or the care you received while you were in the hospital after you are discharged, you can call the unit and asked to speak with the hospitalist on call if the hospitalist that took care of you is not available. Once you are discharged, your primary care physician will handle any further medical issues. Please note that NO REFILLS for any discharge medications will be authorized once you are discharged, as it is imperative that you return to your primary care physician (or establish a relationship with a primary care physician if you do not have one) for your aftercare needs so that they can reassess your need for medications and monitor your lab values.    Today   CHIEF  COMPLAINT:   Chief Complaint  Patient presents with  . Dizziness  . Shortness of Breath    HISTORY OF PRESENT ILLNESS:  Juan Wood  is a 60 y.o. male with a known history of depression, hepatitis C, alcohol abuse, drug abuse, essential hypertension, pulmonary embolism, low back pains who presents to the hospital with complaints of frequent syncopal episodes, severe headache, which is been having for the past few months,  worsening over the past 3 days, mostly in the neck area, posterior Wood, pounding in nature with some blurring of vision. Also ringing in the right ear. Patient was admitted to the hospital for syncope evaluation, he underwent MRA of Wood and neck which showed no acute intracranial process, moderate chronic microvascular ischemic changes, no large proximal arterial branch occlusions, normal MRA of the neck. Acute right maxillary sinusitis was also noted. Patient was seen by neurologist, who recommended to limit aspirin use, get an electroencephalogram, ESR, CRP, and hepatic profile, ENT and cardiology evaluation. Patient had echocardiogram done while in the hospital revealing severe LVH, normal ejection fraction, mild mitral valve regurgitation, grade 1 diastolic dysfunction. Patient had ultrasound of his carotid arteries, which was unremarkable, revealing less than 50% stenosis and bilateral internal carotid arteries. Patient was given antibiotic therapy for sinusitis, pain medications, and IV fluids, and his condition improved, as well as his lab data, his kidney function normalized. His potassium was supplemented. Patient had liver enzymes checked, which revealed AST and ALT 99 and 141, respectively, his platelet count was also found to be low and diminished as time progressed, signifying bone marrow abnormality, likely alcohol-related. Sedimentation rate was normal at 6. CPR was less than 0.5. Electroencephalogram results are still pending. Patient has improved significantly and he is ready to be discharged home.  Discussion by problem: #1 syncope of uncertain etiology at present, rule out cardiac issues, orthostatic vital signs were checked and were unremarkable, as well as carotid ultrasound, less than 50% stenosis, MRA of intra-and extracranial arteries are unremarkable, echo revealed severe LVH, diastolic dysfunction, mild mitral regurgitation, patient is to follow up with cardiologist as outpatient for  further evaluation for possible hypertrophic cardiomyopathy. #2. Headache, appreciate neurologist input, electroencephalogram results are pending, MRI of brain, MRA was unremarkable, patient was treated for acute maxillary sinusitis and his headache somewhat improved, continue Augmentin to complete course. Also suspect neck related problems, try muscle relaxants, follow-up with primary care physician for further recommendations. No significant abnormalities were found on MRI of the neck #3. Acute sinusitis, continue patient on Augmentin orally, Flonase #4. Essential hypertension, well-controlled on current medications #5  acute renal insufficiency, resolved with therapy #6. Hypokalemia, supplemented intravenously, magnesium level was checked was found to be normal #7. Elevated transaminases level, likely due to alcoholism, hepatitis c #8. Thrombocytopenia, chronic, follow-up as outpatient closely    VITAL SIGNS:  Blood pressure 111/53, pulse 55, temperature 97.8 F (36.6 C), temperature source Oral, resp. rate 18, height 5' 8"  (1.727 m), weight 51.256 kg (113 lb), SpO2 99 %.  I/O:   Intake/Output Summary (Last 24 hours) at 08/11/15 1149 Last data filed at 08/11/15 0737  Gross per 24 hour  Intake   1460 ml  Output    420 ml  Net   1040 ml    PHYSICAL EXAMINATION:  GENERAL:  60 y.o.-year-old patient lying in the bed with no acute distress.  EYES: Pupils equal, round, reactive to light and accommodation. No scleral icterus. Extraocular muscles intact.  HEENT: Wood atraumatic, normocephalic. Oropharynx and  nasopharynx clear.  NECK:  Supple, no jugular venous distention. No thyroid enlargement, no tenderness. . Patient complaint of discomfort on palpation of his neck posteriorly and posterior Wood/occipital area LUNGS: Normal breath sounds bilaterally, no wheezing, rales,rhonchi or crepitation. No use of accessory muscles of respiration.  CARDIOVASCULAR: S1, S2 normal. No murmurs, rubs, or  gallops.  ABDOMEN: Soft, non-tender, non-distended. Bowel sounds present. No organomegaly or mass.  EXTREMITIES: No pedal edema, cyanosis, or clubbing.  NEUROLOGIC: Cranial nerves II through XII are intact. Muscle strength 5/5 in all extremities. Sensation intact. Gait not checked.  PSYCHIATRIC: The patient is alert and oriented x 3.  SKIN: No obvious rash, lesion, or ulcer.   DATA REVIEW:   CBC  Recent Labs Lab 08/10/15 0547 08/11/15 0527  WBC 4.3  --   HGB 15.1  --   HCT 44.9  --   PLT 111* 95*    Chemistries   Recent Labs Lab 08/10/15 0547 08/10/15 1111 08/10/15 1717  NA 135  --   --   K 3.3*  --   --   CL 99*  --   --   CO2 28  --   --   GLUCOSE 88  --   --   BUN 25*  --   --   CREATININE 1.10  --   --   CALCIUM 9.4  --   --   MG  --  1.9  --   AST  --   --  99*  ALT  --   --  141*  ALKPHOS  --   --  70  BILITOT  --   --  0.7    Cardiac Enzymes  Recent Labs Lab 08/10/15 1717  TROPONINI <0.03    Microbiology Results  No results found for this or any previous visit.  RADIOLOGY:  Dg Chest 2 View  08/09/2015  CLINICAL DATA:  Lightheadedness, shortness of breath. History of hypertension, hepatitis-C and pulmonary embolism. EXAM: CHEST  2 VIEW COMPARISON:  Chest x-rays dated 09/20/2014 and 12/14/2013. FINDINGS: Heart size is normal. Overall cardiomediastinal silhouette is stable in size and configuration. Lungs are hyperexpanded indicating COPD. Lungs are clear. No evidence of pneumonia. No pleural effusion or pneumothorax seen. Osseous and soft tissue structures about the chest are unremarkable. IMPRESSION: 1. Hyperexpanded lungs suggesting COPD/emphysema. 2. Lungs are clear and there is no evidence of acute cardiopulmonary abnormality. No evidence of pneumonia. Electronically Signed   By: Franki Cabot M.D.   On: 08/09/2015 20:36   Mr Jodene Nam Wood Wo Contrast  08/10/2015  CLINICAL DATA:  Initial evaluation for acute syncope. EXAM: MRI Wood WITHOUT AND WITH  CONTRAST MRA Wood WITHOUT CONTRAST MRA NECK WITHOUT AND WITH CONTRAST TECHNIQUE: Multiplanar, multiecho pulse sequences of the brain and surrounding structures were obtained without and with intravenous contrast. Angiographic images of the Circle of Willis were obtained using MRA technique without intravenous contrast. Angiographic images of the neck were obtained using MRA technique without and with intravenous contrast. Carotid stenosis measurements (when applicable) are obtained utilizing NASCET criteria, using the distal internal carotid diameter as the denominator. CONTRAST:  37m MULTIHANCE GADOBENATE DIMEGLUMINE 529 MG/ML IV SOLN COMPARISON:  Prior CT from 08/05/2011. FINDINGS: MRI Wood FINDINGS Cerebral volume within normal limits for patient age. Patchy T2/FLAIR hyperintensity within the periventricular, deep, and subcortical white matter, nonspecific, but likely related to moderate chronic small vessel ischemic disease. No abnormal foci of restricted diffusion to suggest acute infarct. Gray-white matter differentiation maintained. Major intracranial vascular  flow voids are preserved. No acute or chronic intracranial hemorrhage. No areas of chronic infarction. No mass lesion, midline shift, or mass effect. No hydrocephalus. No extra-axial fluid collection. Major dural sinuses are patent. No abnormal enhancement. Craniocervical junction normal. Visualized upper cervical spine unremarkable. Pituitary gland within normal limits. No acute abnormality about the orbits. Fluid level within the right max O sinus. Paranasal sinuses are otherwise clear. Small right mastoid effusion. Trace opacity within left mastoid air cells. Inner ear structures normal. Bone marrow signal intensity within normal limits. No scalp soft tissue abnormality. MRA Wood FINDINGS ANTERIOR CIRCULATION: Distal cervical segments of the internal carotid arteries are widely patent with antegrade flow. Features, cavernous, and supraclinoid  segments widely patent without flow-limiting stenosis. A1 segments patent. Left A1 segment slightly hypoplastic, which likely accounts for the slight diminutive size caliber of the left ICA is compared to the right. Possible mild short-segment stenosis within the mid left A1 segment noted. Anterior communicating artery normal. Anterior cerebral arteries well opacified. M1 segments patent without stenosis or occlusion. MCA bifurcations normal. Distal MCA branches symmetric and well perfused. POSTERIOR CIRCULATION: Vertebral arteries patent to the vertebrobasilar junction. Posterior inferior cerebral arteries patent. Basilar artery patent to its distal aspect. Superior cerebral arteries patent bilaterally. Posterior cerebral arteries arise in the basilar artery are well opacified to their distal aspects. Small bilateral posterior communicating arteries noted. No aneurysm or vascular malformation. MRA NECK FINDINGS Visualized portions of the aortic arch of normal caliber with normal branch pattern. No high-grade stenosis at the origin of the great vessels. Visualized subclavian arteries widely patent. Right common carotid artery widely patent from its origin to the bifurcation. No significant atheromatous disease about the right bifurcation. Right ICA patent from the bifurcation to the skullbase without stenosis, dissection, or occlusion. Left common carotid artery patent from its origin to the bifurcation. No significant atheromatous disease about the left bifurcation. Left ICA patent distally without stenosis, dissection, or occlusion. The left vertebral artery arises separately from the aortic arch. Vertebral arteries appear to be code dominant. Vertebral arteries patent within the neck without stenosis, dissection, or occlusion. No definite soft tissue abnormality identified within the neck. IMPRESSION: 1. No acute intracranial process identified. 2. Moderate chronic microvascular ischemic disease. 3. Normal  intracranial MRA. No large or proximal arterial branch occlusion. No high-grade or correctable stenosis. 4. Normal MRA of the neck. No critical or flow-limiting stenosis identified. 5. Acute right maxillary sinusitis. Electronically Signed   By: Jeannine Boga M.D.   On: 08/10/2015 04:19   Mr Angiogram Neck W Wo Contrast  08/10/2015  CLINICAL DATA:  Initial evaluation for acute syncope. EXAM: MRI Wood WITHOUT AND WITH CONTRAST MRA Wood WITHOUT CONTRAST MRA NECK WITHOUT AND WITH CONTRAST TECHNIQUE: Multiplanar, multiecho pulse sequences of the brain and surrounding structures were obtained without and with intravenous contrast. Angiographic images of the Circle of Willis were obtained using MRA technique without intravenous contrast. Angiographic images of the neck were obtained using MRA technique without and with intravenous contrast. Carotid stenosis measurements (when applicable) are obtained utilizing NASCET criteria, using the distal internal carotid diameter as the denominator. CONTRAST:  29m MULTIHANCE GADOBENATE DIMEGLUMINE 529 MG/ML IV SOLN COMPARISON:  Prior CT from 08/05/2011. FINDINGS: MRI Wood FINDINGS Cerebral volume within normal limits for patient age. Patchy T2/FLAIR hyperintensity within the periventricular, deep, and subcortical white matter, nonspecific, but likely related to moderate chronic small vessel ischemic disease. No abnormal foci of restricted diffusion to suggest acute infarct. Gray-white matter differentiation maintained. Major  intracranial vascular flow voids are preserved. No acute or chronic intracranial hemorrhage. No areas of chronic infarction. No mass lesion, midline shift, or mass effect. No hydrocephalus. No extra-axial fluid collection. Major dural sinuses are patent. No abnormal enhancement. Craniocervical junction normal. Visualized upper cervical spine unremarkable. Pituitary gland within normal limits. No acute abnormality about the orbits. Fluid level within  the right max O sinus. Paranasal sinuses are otherwise clear. Small right mastoid effusion. Trace opacity within left mastoid air cells. Inner ear structures normal. Bone marrow signal intensity within normal limits. No scalp soft tissue abnormality. MRA Wood FINDINGS ANTERIOR CIRCULATION: Distal cervical segments of the internal carotid arteries are widely patent with antegrade flow. Features, cavernous, and supraclinoid segments widely patent without flow-limiting stenosis. A1 segments patent. Left A1 segment slightly hypoplastic, which likely accounts for the slight diminutive size caliber of the left ICA is compared to the right. Possible mild short-segment stenosis within the mid left A1 segment noted. Anterior communicating artery normal. Anterior cerebral arteries well opacified. M1 segments patent without stenosis or occlusion. MCA bifurcations normal. Distal MCA branches symmetric and well perfused. POSTERIOR CIRCULATION: Vertebral arteries patent to the vertebrobasilar junction. Posterior inferior cerebral arteries patent. Basilar artery patent to its distal aspect. Superior cerebral arteries patent bilaterally. Posterior cerebral arteries arise in the basilar artery are well opacified to their distal aspects. Small bilateral posterior communicating arteries noted. No aneurysm or vascular malformation. MRA NECK FINDINGS Visualized portions of the aortic arch of normal caliber with normal branch pattern. No high-grade stenosis at the origin of the great vessels. Visualized subclavian arteries widely patent. Right common carotid artery widely patent from its origin to the bifurcation. No significant atheromatous disease about the right bifurcation. Right ICA patent from the bifurcation to the skullbase without stenosis, dissection, or occlusion. Left common carotid artery patent from its origin to the bifurcation. No significant atheromatous disease about the left bifurcation. Left ICA patent distally  without stenosis, dissection, or occlusion. The left vertebral artery arises separately from the aortic arch. Vertebral arteries appear to be code dominant. Vertebral arteries patent within the neck without stenosis, dissection, or occlusion. No definite soft tissue abnormality identified within the neck. IMPRESSION: 1. No acute intracranial process identified. 2. Moderate chronic microvascular ischemic disease. 3. Normal intracranial MRA. No large or proximal arterial branch occlusion. No high-grade or correctable stenosis. 4. Normal MRA of the neck. No critical or flow-limiting stenosis identified. 5. Acute right maxillary sinusitis. Electronically Signed   By: Jeannine Boga M.D.   On: 08/10/2015 04:19   Mr Jeri Cos CW Contrast  08/10/2015  CLINICAL DATA:  Initial evaluation for acute syncope. EXAM: MRI Wood WITHOUT AND WITH CONTRAST MRA Wood WITHOUT CONTRAST MRA NECK WITHOUT AND WITH CONTRAST TECHNIQUE: Multiplanar, multiecho pulse sequences of the brain and surrounding structures were obtained without and with intravenous contrast. Angiographic images of the Circle of Willis were obtained using MRA technique without intravenous contrast. Angiographic images of the neck were obtained using MRA technique without and with intravenous contrast. Carotid stenosis measurements (when applicable) are obtained utilizing NASCET criteria, using the distal internal carotid diameter as the denominator. CONTRAST:  77m MULTIHANCE GADOBENATE DIMEGLUMINE 529 MG/ML IV SOLN COMPARISON:  Prior CT from 08/05/2011. FINDINGS: MRI Wood FINDINGS Cerebral volume within normal limits for patient age. Patchy T2/FLAIR hyperintensity within the periventricular, deep, and subcortical white matter, nonspecific, but likely related to moderate chronic small vessel ischemic disease. No abnormal foci of restricted diffusion to suggest acute infarct. Gray-white matter differentiation maintained.  Major intracranial vascular flow voids are  preserved. No acute or chronic intracranial hemorrhage. No areas of chronic infarction. No mass lesion, midline shift, or mass effect. No hydrocephalus. No extra-axial fluid collection. Major dural sinuses are patent. No abnormal enhancement. Craniocervical junction normal. Visualized upper cervical spine unremarkable. Pituitary gland within normal limits. No acute abnormality about the orbits. Fluid level within the right max O sinus. Paranasal sinuses are otherwise clear. Small right mastoid effusion. Trace opacity within left mastoid air cells. Inner ear structures normal. Bone marrow signal intensity within normal limits. No scalp soft tissue abnormality. MRA Wood FINDINGS ANTERIOR CIRCULATION: Distal cervical segments of the internal carotid arteries are widely patent with antegrade flow. Features, cavernous, and supraclinoid segments widely patent without flow-limiting stenosis. A1 segments patent. Left A1 segment slightly hypoplastic, which likely accounts for the slight diminutive size caliber of the left ICA is compared to the right. Possible mild short-segment stenosis within the mid left A1 segment noted. Anterior communicating artery normal. Anterior cerebral arteries well opacified. M1 segments patent without stenosis or occlusion. MCA bifurcations normal. Distal MCA branches symmetric and well perfused. POSTERIOR CIRCULATION: Vertebral arteries patent to the vertebrobasilar junction. Posterior inferior cerebral arteries patent. Basilar artery patent to its distal aspect. Superior cerebral arteries patent bilaterally. Posterior cerebral arteries arise in the basilar artery are well opacified to their distal aspects. Small bilateral posterior communicating arteries noted. No aneurysm or vascular malformation. MRA NECK FINDINGS Visualized portions of the aortic arch of normal caliber with normal branch pattern. No high-grade stenosis at the origin of the great vessels. Visualized subclavian arteries  widely patent. Right common carotid artery widely patent from its origin to the bifurcation. No significant atheromatous disease about the right bifurcation. Right ICA patent from the bifurcation to the skullbase without stenosis, dissection, or occlusion. Left common carotid artery patent from its origin to the bifurcation. No significant atheromatous disease about the left bifurcation. Left ICA patent distally without stenosis, dissection, or occlusion. The left vertebral artery arises separately from the aortic arch. Vertebral arteries appear to be code dominant. Vertebral arteries patent within the neck without stenosis, dissection, or occlusion. No definite soft tissue abnormality identified within the neck. IMPRESSION: 1. No acute intracranial process identified. 2. Moderate chronic microvascular ischemic disease. 3. Normal intracranial MRA. No large or proximal arterial branch occlusion. No high-grade or correctable stenosis. 4. Normal MRA of the neck. No critical or flow-limiting stenosis identified. 5. Acute right maxillary sinusitis. Electronically Signed   By: Jeannine Boga M.D.   On: 08/10/2015 04:19   US Carotid Bilateral  08/10/2015  CLINICAL DATA:  Syncope EXAM: BILATERAL CAROTID DUPLEX ULTRASOUND TECHNIQUE: Pearline Cables scale imaging, color Doppler and duplex ultrasound were performed of bilateral carotid and vertebral arteries in the neck. COMPARISON:  None. FINDINGS: Criteria: Quantification of carotid stenosis is based on velocity parameters that correlate the residual internal carotid diameter with NASCET-based stenosis levels, using the diameter of the distal internal carotid lumen as the denominator for stenosis measurement. The following velocity measurements were obtained: RIGHT ICA:  82 cm/sec CCA:  914 cm/sec SYSTOLIC ICA/CCA RATIO:  0.6 DIASTOLIC ICA/CCA RATIO:  0.9 ECA:  120 cm/sec LEFT ICA:  71 cm/sec CCA:  79 cm/sec SYSTOLIC ICA/CCA RATIO:  0.9 DIASTOLIC ICA/CCA RATIO:  1.8 ECA:  128  cm/sec RIGHT CAROTID ARTERY: Little if any plaque in the bulb. Low resistance internal carotid Doppler pattern. RIGHT VERTEBRAL ARTERY:  Antegrade. LEFT CAROTID ARTERY: Little if any plaque in the bulb. Low resistance internal carotid  Doppler pattern. LEFT VERTEBRAL ARTERY:  Antegrade. IMPRESSION: Less than 50% stenosis in the right and left internal carotid arteries. Electronically Signed   By: Marybelle Killings M.D.   On: 08/10/2015 12:13    EKG:   Orders placed or performed during the hospital encounter of 08/10/15  . EKG 12-Lead  . EKG 12-Lead  . ED EKG within 10 minutes  . ED EKG within 10 minutes      Management plans discussed with the patient, family and they are in agreement.  CODE STATUS:     Code Status Orders        Start     Ordered   08/10/15 0527  Full code   Continuous     08/10/15 0526    Code Status History    Date Active Date Inactive Code Status Order ID Comments User Context   05/23/2015  2:54 PM 05/26/2015  8:30 PM Full Code [id number]  Rainey Pines, MD Inpatient      TOTAL TIME TAKING CARE OF THIS PATIENT: . 40 minutes.   Discussed with Dr. Doy Mince, neurologist  Vivia Rosenburg M.D on 08/11/2015 at 11:49 AM  Between 7am to 6pm - Pager - [phone]  After 6pm go to [email or web address] - password [email or web address] [facility]  [place] [facility]  Office  [phone]  CC: Primary care physician; No PCP Per Patient

## 2015-08-11 NOTE — Progress Notes (Signed)
Pt discharged to home via wc.  Instructions and rx given to pt.  Questions answered.  No distress.  

## 2015-08-11 NOTE — Progress Notes (Signed)
Down for EEG via bed

## 2015-08-11 NOTE — Progress Notes (Signed)
Back from EEG 

## 2015-08-11 NOTE — Progress Notes (Signed)
Dr. Winona LegatoVaickute called, stated EEG was normal, we can discharge pt.

## 2015-08-18 ENCOUNTER — Ambulatory Visit: Payer: Self-pay | Admitting: Urology

## 2015-08-18 VITALS — BP 138/76 | HR 55 | Resp 12 | Ht 69.0 in | Wt 123.0 lb

## 2015-08-18 DIAGNOSIS — I517 Cardiomegaly: Secondary | ICD-10-CM

## 2015-08-18 MED ORDER — LOSARTAN POTASSIUM 25 MG PO TABS: 25.0000 mg | ORAL_TABLET | Freq: Every day | ORAL | Status: DC

## 2015-08-18 MED ORDER — GABAPENTIN 300 MG PO CAPS: 300.0000 mg | ORAL_CAPSULE | Freq: Three times a day (TID) | ORAL | Status: DC

## 2015-08-18 NOTE — Progress Notes (Signed)
  Patient: Juan Wood Male    DOB: Apr 27, 1955   60 y.o.   MRN: [id number] Visit Date: 08/18/2015  Today's Provider: [facility]   Chief Complaint  Patient presents with  . Follow-up   Subjective:    HPI Patient has sciatica.  Needs to see Dr. Justice RocherFossier to discuss MRI results from 02/2014 Patient was found to have LVH- needs to see cardiologist    No Known Allergies Previous Medications   AMLODIPINE (NORVASC) 10 MG TABLET    Take 1 tablet (10 mg total) by mouth daily.   AMOXICILLIN-CLAVULANATE (AUGMENTIN) 875-125 MG TABLET    Take 1 tablet by mouth every 12 (twelve) hours.   FLUOXETINE (PROZAC) 40 MG CAPSULE    Take 1 capsule (40 mg total) by mouth daily.   FLUTICASONE (FLONASE) 50 MCG/ACT NASAL SPRAY    Place 2 sprays into both nostrils daily.   GABAPENTIN (NEURONTIN) 300 MG CAPSULE    Take 1 capsule (300 mg total) by mouth 3 (three) times daily.   HYDROCODONE-ACETAMINOPHEN (NORCO/VICODIN) 5-325 MG TABLET    Take 1-2 tablets by mouth every 4 (four) hours as needed for moderate pain.   LEVOTHYROXINE (SYNTHROID, LEVOTHROID) 25 MCG TABLET    Take 1 tablet (25 mcg total) by mouth daily.   OMEPRAZOLE (PRILOSEC) 20 MG CAPSULE    Take 1 capsule (20 mg total) by mouth daily.   ORPHENADRINE (NORFLEX) 100 MG TABLET    Take 1 tablet (100 mg total) by mouth 2 (two) times daily.   TRAZODONE (DESYREL) 50 MG TABLET    Take 1-2 tablets (50-100 mg total) by mouth at bedtime as needed for sleep.    Review of Systems  Social History  Substance Use Topics  . Smoking status: Current Every Day Smoker -- 0.50 packs/day for 35 years    Types: Cigarettes  . Smokeless tobacco: Not on file  . Alcohol Use: No     Comment: occasional   Objective:   BP 138/76 mmHg  Pulse 55  Resp 12  Ht 5\' 9"  (1.753 m)  Wt 123 lb (55.792 kg)  BMI 18.16 kg/m2  SpO2 99%  Physical Exam      Assessment & Plan:    1. LVH with syncopal episodes  -referral to cardiology    2. Sciatica  -increased the  gabapentin to 600mg  tid  -schedule appointment to go over MRI       [facility]   [facility]

## 2015-08-31 ENCOUNTER — Ambulatory Visit: Payer: Self-pay | Admitting: Specialist

## 2015-09-14 ENCOUNTER — Encounter: Payer: Self-pay | Admitting: Specialist

## 2015-09-22 ENCOUNTER — Ambulatory Visit: Payer: Self-pay

## 2015-10-06 ENCOUNTER — Ambulatory Visit: Payer: Self-pay

## 2015-10-07 ENCOUNTER — Other Ambulatory Visit: Payer: Self-pay | Admitting: Urology

## 2015-10-12 ENCOUNTER — Encounter: Payer: Self-pay | Admitting: Specialist

## 2015-10-18 ENCOUNTER — Ambulatory Visit: Payer: Self-pay

## 2015-11-01 ENCOUNTER — Other Ambulatory Visit: Payer: Self-pay | Admitting: Nurse Practitioner

## 2015-11-08 ENCOUNTER — Ambulatory Visit: Payer: Self-pay

## 2015-11-16 ENCOUNTER — Encounter: Payer: Self-pay | Admitting: Specialist

## 2015-11-22 ENCOUNTER — Ambulatory Visit: Payer: Self-pay

## 2015-12-01 ENCOUNTER — Ambulatory Visit: Payer: Self-pay | Admitting: Urology

## 2015-12-01 VITALS — BP 122/75 | HR 64 | Temp 98.6°F | Ht 68.0 in | Wt 122.0 lb

## 2015-12-01 DIAGNOSIS — I7300 Raynaud's syndrome without gangrene: Secondary | ICD-10-CM

## 2015-12-01 DIAGNOSIS — M5440 Lumbago with sciatica, unspecified side: Secondary | ICD-10-CM

## 2015-12-01 DIAGNOSIS — I517 Cardiomegaly: Secondary | ICD-10-CM

## 2015-12-01 MED ORDER — SILDENAFIL CITRATE 20 MG PO TABS: 20.0000 mg | ORAL_TABLET | Freq: Three times a day (TID) | ORAL | Status: DC

## 2015-12-01 MED ORDER — LEVOTHYROXINE SODIUM 25 MCG PO TABS: 25.0000 ug | ORAL_TABLET | Freq: Every day | ORAL | 0 refills | Status: DC

## 2015-12-01 NOTE — Progress Notes (Signed)
  Patient: Juan Wood Male    DOB: 1955/07/18   60 y.o.   MRN: [id number] Visit Date: 12/01/2015  Today's Provider: [facility]   Chief Complaint  Patient presents with  . Follow-up    Patient has Raynaud's and it's getting worse  . Back Pain    Getting worse   Subjective:    HPI Raynaud's disease is getting worse.  Already on amlodipind and losartan.  He is down to 10 cigarettes daily.      No Known Allergies Previous Medications   AMLODIPINE (NORVASC) 10 MG TABLET    Take 1 tablet (10 mg total) by mouth daily.   AMOXICILLIN-CLAVULANATE (AUGMENTIN) 875-125 MG TABLET    Take 1 tablet by mouth every 12 (twelve) hours.   FLUOXETINE (PROZAC) 40 MG CAPSULE    Take 1 capsule (40 mg total) by mouth daily.   FLUTICASONE (FLONASE) 50 MCG/ACT NASAL SPRAY    Place 2 sprays into both nostrils daily.   GABAPENTIN (NEURONTIN) 300 MG CAPSULE    Take 2 capsules (600 mg total) 3 (three) times daily   HYDROCODONE-ACETAMINOPHEN (NORCO/VICODIN) 5-325 MG TABLET    Take 1-2 tablets by mouth every 4 (four) hours as needed for moderate pain.   LEVOTHYROXINE (SYNTHROID, LEVOTHROID) 25 MCG TABLET    TAKE ONE TABLET BY MOUTH EVERY DAY   LOSARTAN (COZAAR) 50 MG TABLET    Take 1/2 tablet (25 mg total) by mouth daily.   OMEPRAZOLE (PRILOSEC) 20 MG CAPSULE    Take 1 capsule (20 mg total) by mouth daily.   ORPHENADRINE (NORFLEX) 100 MG TABLET    Take 1 tablet (100 mg total) by mouth 2 (two) times daily.   TRAZODONE (DESYREL) 50 MG TABLET    Take 1-2 tablets (50-100 mg total) by mouth at bedtime as needed for sleep.    Review of Systems  Social History  Substance Use Topics  . Smoking status: Current Every Day Smoker    Packs/day: 0.50    Years: 35.00    Types: Cigarettes  . Smokeless tobacco: Not on file  . Alcohol use No     Comment: occasional   Objective:   BP 122/75   Pulse 64   Temp 98.6 F (37 C)   Ht 5\' 8"  (1.727 m)   Wt 122 lb (55.3 kg)   SpO2 98%   BMI 18.55 kg/m    Physical Exam Constitutional: Well nourished. Alert and oriented, No acute distress. HEENT: [place] AT, moist mucus membranes. Trachea midline, no masses. Cardiovascular: No clubbing, cyanosis, or edema. Respiratory: Normal respiratory effort, no increased work of breathing. Skin: No rashes, bruises or suspicious lesions. Lymph: No cervical or inguinal adenopathy. Neurologic: Grossly intact, no focal deficits, moving all 4 extremities. Psychiatric: Normal mood and affect.      Assessment & Plan:    1. LVH  - appointment with cardiology on 12/14/2015  2. Raynaud's disease  - continue amlodipine and lorsartan  - add sildenafil 20 mg tid  - encourage patient to continue to quit smoking  3. Back pain  - has follow up with Dr. Justice RocherFossier on 12/07/2015  - continue neurontin 600 mg tid  - patient brought in MRI  - appointment with Sherrie SportElon PT pending         [facility]   [facility]

## 2015-12-02 ENCOUNTER — Other Ambulatory Visit: Payer: Self-pay

## 2015-12-02 DIAGNOSIS — M358 Other specified systemic involvement of connective tissue: Principal | ICD-10-CM

## 2015-12-02 DIAGNOSIS — L940 Localized scleroderma [morphea]: Secondary | ICD-10-CM

## 2015-12-02 DIAGNOSIS — K743 Primary biliary cirrhosis: Secondary | ICD-10-CM

## 2015-12-02 MED ORDER — SILDENAFIL CITRATE 20 MG PO TABS: 20.0000 mg | ORAL_TABLET | Freq: Three times a day (TID) | ORAL | 0 refills | Status: DC

## 2015-12-07 ENCOUNTER — Encounter: Payer: Self-pay | Admitting: Specialist

## 2015-12-08 ENCOUNTER — Telehealth: Payer: Self-pay

## 2015-12-08 NOTE — Telephone Encounter (Signed)
Pt called in to reschedule ortho appt from 09/27. Appt made for 10/4. Pt stated " he brought in the MRI results last week and should be in folder"

## 2015-12-14 ENCOUNTER — Encounter: Payer: Self-pay | Admitting: Specialist

## 2015-12-21 ENCOUNTER — Other Ambulatory Visit: Payer: Self-pay | Admitting: Family Medicine

## 2015-12-21 ENCOUNTER — Ambulatory Visit
Admission: RE | Admit: 2015-12-21 | Discharge: 2015-12-21 | Disposition: A | Discharge status: Home / Self Care | Payer: Disability Insurance | Source: Ambulatory Visit | Attending: Family Medicine | Admitting: Family Medicine

## 2015-12-21 DIAGNOSIS — M5136 Other intervertebral disc degeneration, lumbar region: Secondary | ICD-10-CM | POA: Insufficient documentation

## 2015-12-21 DIAGNOSIS — G8929 Other chronic pain: Secondary | ICD-10-CM | POA: Insufficient documentation

## 2015-12-21 DIAGNOSIS — M549 Dorsalgia, unspecified: Secondary | ICD-10-CM | POA: Diagnosis present

## 2015-12-22 ENCOUNTER — Telehealth: Payer: Self-pay | Admitting: Nurse Practitioner

## 2015-12-22 NOTE — Telephone Encounter (Signed)
Juan HillDonald called to reschedule his appointment.

## 2015-12-22 NOTE — Telephone Encounter (Signed)
Left a voicemail for Dorinda HillDonald letting him know that he as an upcoming appointment on October 26th and for him to call us back if he needs to be seen before then.

## 2016-01-05 ENCOUNTER — Ambulatory Visit: Payer: Self-pay

## 2016-01-26 ENCOUNTER — Ambulatory Visit: Payer: Self-pay | Admitting: Adult Health Nurse Practitioner

## 2016-01-26 VITALS — BP 136/68 | HR 64 | Temp 98.5°F | Resp 16 | Ht 69.0 in | Wt 123.0 lb

## 2016-01-26 DIAGNOSIS — M79641 Pain in right hand: Secondary | ICD-10-CM

## 2016-01-26 DIAGNOSIS — M5442 Lumbago with sciatica, left side: Secondary | ICD-10-CM

## 2016-01-26 DIAGNOSIS — I7300 Raynaud's syndrome without gangrene: Secondary | ICD-10-CM

## 2016-01-26 DIAGNOSIS — M545 Low back pain, unspecified: Secondary | ICD-10-CM | POA: Insufficient documentation

## 2016-01-26 DIAGNOSIS — Z0000 Encounter for general adult medical examination without abnormal findings: Secondary | ICD-10-CM

## 2016-01-26 MED ORDER — GABAPENTIN 300 MG PO CAPS: 600.0000 mg | ORAL_CAPSULE | Freq: Three times a day (TID) | ORAL | 1 refills | Status: DC

## 2016-01-26 NOTE — Progress Notes (Signed)
Patient: Juan Wood Male    DOB: 1955/04/05   60 y.o.   MRN: [id number] Visit Date: 01/26/2016  Today's Provider: [facility]   Chief Complaint  Patient presents with  . Hand Problem  . Back Pain   Subjective:    HPI   Pt states that if closes his 3rd and 4th digit of the R hand that it hurts terribly.  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  States that it is getting worse.  Pt states that his Raynaud's is not improved. Has not gotten sildenafil for MM.            States that he is down to 6-8 per day.  Pt states that he saw Dr. Justice RocherFossier.  No Known Allergies Previous Medications   AMLODIPINE (NORVASC) 10 MG TABLET    Take 1 tablet (10 mg total) by mouth daily.   AMOXICILLIN-CLAVULANATE (AUGMENTIN) 875-125 MG TABLET    Take 1 tablet by mouth every 12 (twelve) hours.   FLUOXETINE (PROZAC) 40 MG CAPSULE    Take 1 capsule (40 mg total) by mouth daily.   FLUTICASONE (FLONASE) 50 MCG/ACT NASAL SPRAY    Place 2 sprays into both nostrils daily.   GABAPENTIN (NEURONTIN) 300 MG CAPSULE    Take 2 capsules (600 mg total) 3 (three) times daily   HYDROCODONE-ACETAMINOPHEN (NORCO/VICODIN) 5-325 MG TABLET    Take 1-2 tablets by mouth every 4 (four) hours as needed for moderate pain.   LEVOTHYROXINE (SYNTHROID, LEVOTHROID) 25 MCG TABLET    Take 1 tablet (25 mcg total) by mouth daily.   LOSARTAN (COZAAR) 50 MG TABLET    Take 1/2 tablet (25 mg total) by mouth daily.   OMEPRAZOLE (PRILOSEC) 20 MG CAPSULE    Take 1 capsule (20 mg total) by mouth daily.   ORPHENADRINE (NORFLEX) 100 MG TABLET    Take 1 tablet (100 mg total) by mouth 2 (two) times daily.   SILDENAFIL (REVATIO) 20 MG TABLET    Take 1 tablet (20 mg total) by mouth 3 (three) times daily.   TRAZODONE (DESYREL) 50 MG TABLET    Take 1-2 tablets (50-100 mg total) by mouth at bedtime as needed for sleep.    Review of Systems  All other systems reviewed and  are negative.   Social History  Substance Use Topics  . Smoking status: Current Every Day Smoker    Packs/day: 0.50    Years: 35.00    Types: Cigarettes  . Smokeless tobacco: Not on file  . Alcohol use No     Comment: occasional   Objective:   BP 136/68   Pulse 64   Temp 98.5 F (36.9 C)   Resp 16   Ht 5\' 9"  (1.753 m)   Wt 123 lb (55.8 kg)   BMI 18.16 kg/m   Physical Exam  Constitutional: He is oriented to person, place, and time. He appears well-developed and well-nourished.  HENT:  Head: Normocephalic and atraumatic.  Eyes: Pupils are equal, round, and reactive to light.  Cardiovascular: Normal rate, regular rhythm and normal heart sounds.   Pulmonary/Chest: Effort normal and breath sounds normal.  Abdominal: Soft. Bowel sounds are normal.  Neurological: He is alert and oriented to person, place, and time.  Skin: Skin is warm and dry.  Psychiatric: He has a normal mood and affect.  Vitals reviewed.  Assessment & Plan:          Hand Pain/Raynauds: Follow up with Dr. Justice RocherFossier.  Call [facility] for sildenafil rx.  Encouraged smoking cessation.   Back Pain:  Continue to follow up with Dr. Justice RocherFossier.   FU in 2 weeks for labs and 4 weeks for CC FU.     [facility]   [facility]

## 2016-02-07 ENCOUNTER — Other Ambulatory Visit: Payer: Self-pay | Admitting: Internal Medicine

## 2016-02-08 ENCOUNTER — Encounter: Payer: Self-pay | Admitting: Specialist

## 2016-02-08 ENCOUNTER — Telehealth: Payer: Self-pay

## 2016-02-08 NOTE — Telephone Encounter (Signed)
Called pt back to discuss that his RX for gabapentin was  [facility] and they should be calling him soon to pick it up. No answer. Left msg.

## 2016-02-09 ENCOUNTER — Other Ambulatory Visit: Payer: Self-pay

## 2016-02-14 ENCOUNTER — Other Ambulatory Visit: Payer: Self-pay

## 2016-02-22 ENCOUNTER — Encounter: Payer: Self-pay | Admitting: Specialist

## 2016-02-23 ENCOUNTER — Ambulatory Visit: Payer: Self-pay

## 2016-03-14 ENCOUNTER — Other Ambulatory Visit: Payer: Self-pay | Admitting: Nurse Practitioner

## 2016-03-14 ENCOUNTER — Other Ambulatory Visit: Payer: Self-pay | Admitting: Urology

## 2016-03-14 ENCOUNTER — Other Ambulatory Visit: Payer: Self-pay | Admitting: Internal Medicine

## 2016-03-14 DIAGNOSIS — I10 Essential (primary) hypertension: Secondary | ICD-10-CM

## 2016-04-02 ENCOUNTER — Other Ambulatory Visit: Payer: Self-pay | Admitting: Urology

## 2016-04-24 ENCOUNTER — Other Ambulatory Visit: Payer: Self-pay | Admitting: Internal Medicine

## 2016-06-11 ENCOUNTER — Other Ambulatory Visit: Payer: Self-pay | Admitting: Internal Medicine

## 2016-07-05 ENCOUNTER — Telehealth: Payer: Self-pay | Admitting: Pharmacist

## 2016-07-05 NOTE — Telephone Encounter (Signed)
07/05/16 Faxed refill request to [facility] for Prozac  Take one capsule by mouth once daily.

## 2016-09-11 ENCOUNTER — Emergency Department: Payer: Self-pay

## 2016-09-11 ENCOUNTER — Emergency Department
Admission: EM | Admit: 2016-09-11 | Discharge: 2016-09-11 | Disposition: A | Discharge status: Home / Self Care | Payer: Self-pay | Attending: Emergency Medicine | Admitting: Emergency Medicine

## 2016-09-11 ENCOUNTER — Encounter: Payer: Self-pay | Admitting: Medical Oncology

## 2016-09-11 DIAGNOSIS — Y93H2 Activity, gardening and landscaping: Secondary | ICD-10-CM | POA: Insufficient documentation

## 2016-09-11 DIAGNOSIS — Z23 Encounter for immunization: Secondary | ICD-10-CM | POA: Insufficient documentation

## 2016-09-11 DIAGNOSIS — F17210 Nicotine dependence, cigarettes, uncomplicated: Secondary | ICD-10-CM | POA: Insufficient documentation

## 2016-09-11 DIAGNOSIS — Z79899 Other long term (current) drug therapy: Secondary | ICD-10-CM | POA: Insufficient documentation

## 2016-09-11 DIAGNOSIS — Y929 Unspecified place or not applicable: Secondary | ICD-10-CM | POA: Insufficient documentation

## 2016-09-11 DIAGNOSIS — S81012A Laceration without foreign body, left knee, initial encounter: Secondary | ICD-10-CM | POA: Insufficient documentation

## 2016-09-11 DIAGNOSIS — Y999 Unspecified external cause status: Secondary | ICD-10-CM | POA: Insufficient documentation

## 2016-09-11 DIAGNOSIS — W293XXA Contact with powered garden and outdoor hand tools and machinery, initial encounter: Secondary | ICD-10-CM | POA: Insufficient documentation

## 2016-09-11 DIAGNOSIS — E039 Hypothyroidism, unspecified: Secondary | ICD-10-CM | POA: Insufficient documentation

## 2016-09-11 DIAGNOSIS — S81812A Laceration without foreign body, left lower leg, initial encounter: Secondary | ICD-10-CM

## 2016-09-11 DIAGNOSIS — I10 Essential (primary) hypertension: Secondary | ICD-10-CM | POA: Insufficient documentation

## 2016-09-11 MED ORDER — CLINDAMYCIN HCL 300 MG PO CAPS: 300.0000 mg | ORAL_CAPSULE | Freq: Three times a day (TID) | ORAL | 0 refills | Status: AC

## 2016-09-11 MED ORDER — OXYCODONE-ACETAMINOPHEN 5-325 MG PO TABS
1.0000 | ORAL_TABLET | Freq: Once | ORAL | Status: AC
  Administered 2016-09-11: 1 via ORAL
  Filled 2016-09-11: qty 1

## 2016-09-11 MED ORDER — CLINDAMYCIN PHOSPHATE 600 MG/50ML IV SOLN
600.0000 mg | Freq: Once | INTRAVENOUS | Status: AC
  Administered 2016-09-11: 600 mg via INTRAVENOUS
  Filled 2016-09-11: qty 50

## 2016-09-11 MED ORDER — LIDOCAINE HCL (PF) 1 % IJ SOLN
5.0000 mL | Freq: Once | INTRAMUSCULAR | Status: DC
  Filled 2016-09-11: qty 5

## 2016-09-11 MED ORDER — OXYCODONE-ACETAMINOPHEN 5-325 MG PO TABS: 1.0000 | ORAL_TABLET | Freq: Four times a day (QID) | ORAL | 0 refills | Status: DC | PRN

## 2016-09-11 MED ORDER — TETANUS-DIPHTH-ACELL PERTUSSIS 5-2.5-18.5 LF-MCG/0.5 IM SUSP
0.5000 mL | Freq: Once | INTRAMUSCULAR | Status: AC
  Administered 2016-09-11: 0.5 mL via INTRAMUSCULAR
  Filled 2016-09-11: qty 0.5

## 2016-09-11 NOTE — ED Triage Notes (Signed)
Pt reports yesterday he cut his left knee with chainsaw. Area is covered and bleeding controlled.

## 2016-09-11 NOTE — ED Provider Notes (Signed)
[facility] Emergency Department Provider Note  ____________________________________________  Time seen: Approximately 9:01 AM  I have reviewed the triage vital signs and the nursing notes.   HISTORY  Chief Complaint Laceration    HPI Juan Wood is a 61 y.o. male that presents to the emergency department with laceration over left knee. Patient cut himself with a chainsaw yesterday afternoon. He tried to put Steri-Strips on the wound but it kept breaking open and bleeding last night. He does not recall last tetanus shot. He denies fever, chills, shortness of breath, chest pain, nausea, vomiting, abdominal pain.   Past Medical History:  Diagnosis Date  . Alcohol abuse   . Allergy    Hay fever  . Depression   . Drug abuse   . Hepatitis C   . Hypertension   . Lower back pain   . Pulmonary embolism (HCC)   . Thyroid disease     Patient Active Problem List   Diagnosis Date Noted  . Low back pain 01/26/2016  . Raynaud phenomenon 01/26/2016  . Right hand pain 01/26/2016  . Headache 08/11/2015  . Neck pain 08/11/2015  . Acute maxillary sinusitis 08/11/2015  . Essential hypertension 08/11/2015  . LVH (left ventricular hypertrophy) 08/11/2015  . Acute renal insufficiency 08/11/2015  . Dehydration 08/11/2015  . Hypokalemia 08/11/2015  . Elevated transaminase level 08/11/2015  . Thrombocytopenia (HCC) 08/11/2015  . Syncope 08/10/2015  . Hepatitis C 05/25/2015  . Alcohol dependence with alcohol-induced mood disorder (HCC)   . 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 Tobacco use disorder 05/23/2015  . Hypothyroidism 05/23/2015  . HTN (hypertension) 05/23/2015  . GERD (gastroesophageal reflux disease) 05/23/2015  . Alcohol abuse with intoxication (HCC) 05/23/2015    Past Surgical History:  Procedure Laterality Date  .  HERNIA REPAIR    . KNEE SURGERY     right    Prior to Admission medications   Medication Sig Start Date End Date Taking? Authorizing Provider  amLODipine (NORVASC) 10 MG tablet TAKE 1 TABLET BY MOUTH EVERY DAY. 03/14/16   Odem, Deeann Creeonna S, FNP  amoxicillin-clavulanate (AUGMENTIN) 875-125 MG tablet Take 1 tablet by mouth every 12 (twelve) hours. 08/11/15   Katharina CaperVaickute, Rima, MD  clindamycin (CLEOCIN) 300 MG capsule Take 1 capsule (300 mg total) by mouth 3 (three) times daily. 09/11/16 09/21/16  Enid DerryWagner, Madhav Mohon, PA-C  FLUoxetine (PROZAC) 40 MG capsule Take 1 capsule (40 mg total) by mouth daily. 08/11/15   Katharina CaperVaickute, Rima, MD  fluticasone (FLONASE) 50 MCG/ACT nasal spray Place 2 sprays into both nostrils daily. 08/11/15   Katharina CaperVaickute, Rima, MD  gabapentin (NEURONTIN) 600 MG tablet TAKE ONE TABLET BY MOUTH 3 TIMES A DAY 03/14/16   McGowan, Carollee HerterShannon A, PA-C  levothyroxine (SYNTHROID, LEVOTHROID) 25 MCG tablet TAKE ONE TABLET BY MOUTH EVERY DAY 03/14/16   Zachery Dauerdem, Donna S, FNP  losartan (COZAAR) 50 MG tablet TAKE 1/2 TABLET BY MOUTH EVERY DAY 04/05/16   McGowan, Carollee HerterShannon A, PA-C  omeprazole (PRILOSEC) 20 MG capsule TAKE ONE CAPSULE BY MOUTH EVERY DAY 06/12/16   Virl Axehaplin, Don C, MD  orphenadrine (NORFLEX) 100 MG tablet Take 1 tablet (100 mg total) by mouth 2 (two) times daily. 08/11/15   Katharina CaperVaickute, Rima, MD  oxyCODONE-acetaminophen (ROXICET) 5-325 MG tablet Take 1 tablet by mouth every 6 (six) hours as needed. 09/11/16 09/11/17  Enid DerryWagner, Tashaya Ancrum, PA-C  sildenafil (REVATIO) 20 MG tablet Take 1  tablet (20 mg total) by mouth 3 (three) times daily. 12/02/15   Michiel Cowboy A, PA-C  traZODone (DESYREL) 50 MG tablet Take 1-2 tablets (50-100 mg total) by mouth at bedtime as needed for sleep. 08/11/15   Katharina Caper, MD    Allergies Patient has no known allergies.  Family History  Problem Relation Age of Onset  . Heart attack Father        x 2  . Cancer Father        Pancreatic  . Heart attack Mother   . Hypertension Mother   . Cancer  Mother        Skin    Social History Social History  Substance Use Topics  . Smoking status: Current Every Day Smoker    Packs/day: 0.50    Years: 35.00    Types: Cigarettes  . Smokeless tobacco: Not on file  . Alcohol use No     Comment: occasional     Review of Systems  Constitutional: No fever/chills ENT: No upper respiratory complaints. Respiratory:  No SOB. Gastrointestinal: No abdominal pain.  No nausea, no vomiting.  Musculoskeletal: Positive for knee pain. Skin: Negative for ecchymosis. Positive for laceration. Neurological: Negative for headaches, numbness or tingling   ____________________________________________   PHYSICAL EXAM:  VITAL SIGNS: ED Triage Vitals  Enc Vitals Group     BP 09/11/16 0839 137/80     Pulse Rate 09/11/16 0839 63     Resp 09/11/16 0839 16     Temp 09/11/16 0839 97.6 F (36.4 C)     Temp Source 09/11/16 0839 Oral     SpO2 09/11/16 0839 99 %     Weight 09/11/16 0838 140 lb (63.5 kg)     Height 09/11/16 0838 5\' 9"  (1.753 m)     Head Circumference --      Peak Flow --      Pain Score 09/11/16 0837 10     Pain Loc --      Pain Edu? --      Excl. in GC? --      Constitutional: Alert and oriented. Well appearing and in no acute distress. Eyes: Conjunctivae are normal. PERRL. EOMI. Head: Atraumatic. ENT:      Ears:      Nose: No congestion/rhinnorhea.      Mouth/Throat: Mucous membranes are moist.  Neck: No stridor.  Cardiovascular: Normal rate, regular rhythm.  Good peripheral circulation. Respiratory: Normal respiratory effort without tachypnea or retractions. Lungs CTAB. Good air entry to the bases with no decreased or absent breath sounds. Musculoskeletal: Full range of motion to all extremities. No gross deformities appreciated. Neurologic:  Normal speech and language. No gross focal neurologic deficits are appreciated.  Skin:  Skin is warm, dry. 1.5 inch laceration to left knee. Outside portion of laceration is closed  and well approximated. 1/2 inch opening in the center laceration. 1.5 inch closed laceration parallel to other laceration. 2 inches of surrounding erythema. No drainage. Psychiatric: Mood and affect are normal. Speech and behavior are normal. Patient exhibits appropriate insight and judgement.   ____________________________________________   LABS (all labs ordered are listed, but only abnormal results are displayed)  Labs Reviewed - No data to display ____________________________________________  EKG   ____________________________________________  RADIOLOGY Lexine Baton, personally viewed and evaluated these images (plain radiographs) as part of my medical decision making, as well as reviewing the written report by the radiologist.  Dg Knee Complete 4 Views Left  Result Date: 09/11/2016 CLINICAL  DATA:  Knee pain EXAM: LEFT KNEE - COMPLETE 4+ VIEW COMPARISON:  None. FINDINGS: No fracture or dislocation is seen. The joint spaces are preserved. The visualized soft tissues are unremarkable. No suprapatellar knee joint effusion. IMPRESSION: Negative. Electronically Signed   By: Charline Bills M.D.   On: 09/11/2016 09:14    ____________________________________________    PROCEDURES  Procedure(s) performed:    Procedures   LACERATION REPAIR Performed by: Enid Derry  Consent: Verbal consent obtained.  Consent given by: patient  Prepped and Draped in normal sterile fashion  Wound explored: No foreign bodies   Laceration Location: Left knee  Laceration Length: 1.5 inch  Anesthesia: None  Local anesthetic: lidocaine 1% without epinephrine  Anesthetic total: 2 ml  Irrigation method: syringe  Amount of cleaning: normal saline  Skin closure: 3-0 nylon  Number of sutures: 2   Technique: Horizontal mattress   Patient tolerance: Patient tolerated the procedure well with no immediate complications.   Medications  lidocaine (PF) (XYLOCAINE) 1 %  injection 5 mL (not administered)  Tdap (BOOSTRIX) injection 0.5 mL (0.5 mLs Intramuscular Given 09/11/16 0913)  clindamycin (CLEOCIN) IVPB 600 mg (0 mg Intravenous Stopped 09/11/16 1037)  oxyCODONE-acetaminophen (PERCOCET/ROXICET) 5-325 MG per tablet 1 tablet (1 tablet Oral Given 09/11/16 0949)     ____________________________________________   INITIAL IMPRESSION / ASSESSMENT AND PLAN / ED COURSE  Pertinent labs & imaging results that were available during my care of the patient were reviewed by me and considered in my medical decision making (see chart for details).  Review of the [place] CSRS was performed in accordance of the [facility] prior to dispensing any controlled drugs.   Patient's diagnosis is consistent with laceration. Vital signs and exam are reassuring. Laceration was cleaned with normal saline and iodine. Laceration is still within the window to close with stitches. Tetanus shot was updated. Patient was given IV clindamycin. Patient will be discharged home with prescriptions for clindamycin and Percocet. Patient is to follow up with PCP as directed. Patient is given ED precautions to return to the ED for any worsening or new symptoms.     ____________________________________________  FINAL CLINICAL IMPRESSION(S) / ED DIAGNOSES  Final diagnoses:  Laceration of left lower extremity, initial encounter      NEW MEDICATIONS STARTED DURING THIS VISIT:  Discharge Medication List as of 09/11/2016 10:35 AM    START taking these medications   Details  clindamycin (CLEOCIN) 300 MG capsule Take 1 capsule (300 mg total) by mouth 3 (three) times daily., Starting Tue 09/11/2016, Until Fri 09/21/2016, Print    oxyCODONE-acetaminophen (ROXICET) 5-325 MG tablet Take 1 tablet by mouth every 6 (six) hours as needed., Starting Tue 09/11/2016, Until Wed 09/11/2017, Print            This chart was dictated using voice recognition software/Dragon. Despite best efforts to proofread, errors can occur  which can change the meaning. Any change was purely unintentional.    Enid Derry, PA-C 09/11/16 1126    Governor Rooks, MD 09/11/16 [phone]

## 2016-09-11 NOTE — ED Notes (Signed)
See triage note  States he was cutting wood with a chain saw and it kicked back  Hitting his left knee  Laceration and swelling noted to left knee  Unable to bear wt d/t increased pain

## 2016-10-04 ENCOUNTER — Telehealth: Payer: Self-pay

## 2016-10-04 NOTE — Telephone Encounter (Signed)
LVm for patient to return my call. Patient wants to see Dr. Justice RocherFossier he will no longer see patient due to the amount of times he has No Showed for Dr. Justice RocherFossier.

## 2016-10-10 ENCOUNTER — Telehealth: Payer: Self-pay | Admitting: Pharmacy Technician

## 2016-10-10 NOTE — Telephone Encounter (Signed)
Patient failed to provide 2018 proof of income.  No additional medication assistance will be provided by [facility] without the required proof of income documentation.  Patient notified by letter.  Babs Dabbs J. Diezel Mazur Care Manager [facility] 

## 2016-10-15 ENCOUNTER — Telehealth: Payer: Self-pay

## 2016-11-07 ENCOUNTER — Telehealth: Payer: Self-pay | Admitting: Pharmacist

## 2016-11-07 NOTE — Telephone Encounter (Signed)
11/07/16 Faxed refill request to [facility] for Prozac 40mg  Take 1 capsule by mouth once daily.Forde Radon

## 2017-03-01 ENCOUNTER — Telehealth: Payer: Self-pay | Admitting: Pharmacy Technician

## 2017-03-01 NOTE — Telephone Encounter (Signed)
Patient failed to provide current poi.  No additional medication assistance will be provided by [facility] without the required proof of income documentation.  Patient notified by letter.  Carmino Ocain J. Anaka Beazer Care Manager [facility] 

## 2017-05-09 ENCOUNTER — Other Ambulatory Visit: Payer: Self-pay

## 2017-05-09 ENCOUNTER — Emergency Department
Admission: EM | Admit: 2017-05-09 | Discharge: 2017-05-09 | Disposition: A | Discharge status: Home / Self Care | Payer: Disability Insurance | Attending: Emergency Medicine | Admitting: Emergency Medicine

## 2017-05-09 ENCOUNTER — Emergency Department: Payer: Disability Insurance

## 2017-05-09 DIAGNOSIS — Y998 Other external cause status: Secondary | ICD-10-CM | POA: Insufficient documentation

## 2017-05-09 DIAGNOSIS — W228XXA Striking against or struck by other objects, initial encounter: Secondary | ICD-10-CM | POA: Insufficient documentation

## 2017-05-09 DIAGNOSIS — Z79899 Other long term (current) drug therapy: Secondary | ICD-10-CM | POA: Insufficient documentation

## 2017-05-09 DIAGNOSIS — Y9389 Activity, other specified: Secondary | ICD-10-CM | POA: Insufficient documentation

## 2017-05-09 DIAGNOSIS — I10 Essential (primary) hypertension: Secondary | ICD-10-CM | POA: Insufficient documentation

## 2017-05-09 DIAGNOSIS — S9032XA Contusion of left foot, initial encounter: Secondary | ICD-10-CM

## 2017-05-09 DIAGNOSIS — Y929 Unspecified place or not applicable: Secondary | ICD-10-CM | POA: Insufficient documentation

## 2017-05-09 DIAGNOSIS — F17210 Nicotine dependence, cigarettes, uncomplicated: Secondary | ICD-10-CM | POA: Insufficient documentation

## 2017-05-09 DIAGNOSIS — Z86718 Personal history of other venous thrombosis and embolism: Secondary | ICD-10-CM | POA: Insufficient documentation

## 2017-05-09 MED ORDER — HYDROCODONE-ACETAMINOPHEN 5-325 MG PO TABS
2.0000 | ORAL_TABLET | Freq: Once | ORAL | Status: AC
  Administered 2017-05-09: 2 via ORAL
  Filled 2017-05-09: qty 2

## 2017-05-09 MED ORDER — HYDROCODONE-ACETAMINOPHEN 5-325 MG PO TABS: 1.0000 | ORAL_TABLET | Freq: Four times a day (QID) | ORAL | 0 refills | Status: DC | PRN

## 2017-05-09 NOTE — Discharge Instructions (Signed)
Ice and elevate your foot as needed for pain and swelling.  Wear Ace wrap and use wooden shoe for added protection and support.  Use crutches when up walking until able to bear weight without pain.  Take Norco as needed for pain.  You cannot drive while taking this medication.  Also continue taking ibuprofen 3 times a day for inflammation.  [facility] if any continued problems.

## 2017-05-09 NOTE — ED Triage Notes (Signed)
Pt states he was using a wood splitter yesterday and a big piece of wood kicked out and landed on his left foot. Pt came into the ED with crutches from home..Marland Kitchen

## 2017-05-09 NOTE — ED Provider Notes (Signed)
[facility] Emergency Department Provider Note  ____________________________________________   First MD Initiated Contact with Patient 05/09/17 1003     (approximate)  I have reviewed the triage vital signs and the nursing notes.   HISTORY  Chief Complaint Foot Pain   HPI Juan Wood is a 62 y.o. male here with complaint of left foot pain.  Patient states that he was working with a wood splitter yesterday when a large piece of wood was knocked out and landed on his foot.  Patient states he was able to walk afterwards but during the night began having pain that caused him to cry and stay up the rest of the night.  He has been taking ibuprofen without any relief.  He is unable to bear weight this morning due to pain.  Patient arrived to the ED on crutches.  He rates his pain as 10/10.   Past Medical History:  Diagnosis Date  . Alcohol abuse   . Allergy    Hay fever  . Depression   . Drug abuse (HCC)   . Hepatitis C   . Hypertension   . Lower back pain   . Pulmonary embolism (HCC)   . Thyroid disease     Patient Active Problem List   Diagnosis Date Noted  . Low back pain 01/26/2016  . Raynaud phenomenon 01/26/2016  . Right hand pain 01/26/2016  . Headache 08/11/2015  . Neck pain 08/11/2015  . Acute maxillary sinusitis 08/11/2015  . Essential hypertension 08/11/2015  . LVH (left ventricular hypertrophy) 08/11/2015  . Acute renal insufficiency 08/11/2015  . Dehydration 08/11/2015  . Hypokalemia 08/11/2015  . Elevated transaminase level 08/11/2015  . Thrombocytopenia (HCC) 08/11/2015  . Syncope 08/10/2015  . Hepatitis C 05/25/2015  . Alcohol dependence with alcohol-induced mood disorder (HCC)   . 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 Tobacco use disorder 05/23/2015  . Hypothyroidism 05/23/2015  . HTN  (hypertension) 05/23/2015  . GERD (gastroesophageal reflux disease) 05/23/2015  . Alcohol abuse with intoxication (HCC) 05/23/2015    Past Surgical History:  Procedure Laterality Date  . HERNIA REPAIR    . KNEE SURGERY     right    Prior to Admission medications   Medication Sig Start Date End Date Taking? Authorizing Provider  amLODipine (NORVASC) 10 MG tablet TAKE 1 TABLET BY MOUTH EVERY DAY. 03/14/16   Zachery Dauer, FNP  HYDROcodone-acetaminophen (NORCO/VICODIN) 5-325 MG tablet Take 1 tablet by mouth every 6 (six) hours as needed for moderate pain. 05/09/17   Tommi Rumps, PA-C  levothyroxine (SYNTHROID, LEVOTHROID) 25 MCG tablet TAKE ONE TABLET BY MOUTH EVERY DAY 03/14/16   Zachery Dauer, FNP  losartan (COZAAR) 50 MG tablet TAKE 1/2 TABLET BY MOUTH EVERY DAY 04/05/16   McGowan, Carollee Herter A, PA-C  omeprazole (PRILOSEC) 20 MG capsule TAKE ONE CAPSULE BY MOUTH EVERY DAY 06/12/16   Virl Axe, MD  sildenafil (REVATIO) 20 MG tablet Take 1 tablet (20 mg total) by mouth 3 (three) times daily. 12/02/15   Michiel Cowboy A, PA-C  traZODone (DESYREL) 50 MG tablet Take 1-2 tablets (50-100 mg total) by mouth at bedtime as needed for sleep. 08/11/15   Katharina Caper, MD    Allergies Patient has no known allergies.  Family History  Problem Relation Age of Onset  . Heart attack Father        x  2  . Cancer Father        Pancreatic  . Heart attack Mother   . Hypertension Mother   . Cancer Mother        Skin    Social History Social History   Tobacco Use  . Smoking status: Current Every Day Smoker    Packs/day: 0.50    Years: 35.00    Pack years: 17.50    Types: Cigarettes  . Smokeless tobacco: Never Used  Substance Use Topics  . Alcohol use: No    Alcohol/week: 7.2 oz    Types: 12 Cans of beer per week    Comment: occasional  . Drug use: Yes    Types: Cocaine, Opium, Marijuana    Review of Systems Constitutional: No fever/chills Cardiovascular: Denies chest  pain. Respiratory: Denies shortness of breath. Gastrointestinal: .  No nausea, no vomiting.  Musculoskeletal: Positive for left foot pain. Skin: Negative for rash. Neurological: Negative for  focal weakness or numbness. ____________________________________________   PHYSICAL EXAM:  VITAL SIGNS: ED Triage Vitals  Enc Vitals Group     BP 05/09/17 0932 (!) 142/78     Pulse Rate 05/09/17 0932 85     Resp 05/09/17 0932 17     Temp 05/09/17 0932 98.6 F (37 C)     Temp Source 05/09/17 0932 Oral     SpO2 05/09/17 0932 99 %     Weight 05/09/17 0933 135 lb (61.2 kg)     Height 05/09/17 0933 5\' 11"  (1.803 m)     Head Circumference --      Peak Flow --      Pain Score --      Pain Loc --      Pain Edu? --      Excl. in GC? --    Constitutional: Alert and oriented. Well appearing and in no acute distress. Eyes: Conjunctivae are normal.  Head: Atraumatic. Neck: No stridor.   Cardiovascular: Normal rate, regular rhythm. Grossly normal heart sounds.  Good peripheral circulation. Respiratory: Normal respiratory effort.  No retractions. Lungs minimal bilateral expiratory wheeze which clears with cough.  Patient is a current smoker. Musculoskeletal: Examination of the left foot there is no gross deformity and minimal soft tissue swelling.  On palpation of the dorsal aspect of left foot there is marked tenderness in general.  Patient motor sensory function is intact to all digits.  Skin is intact.  Pulses present.  Patient is able to flex and extend his foot. Neurologic:  Normal speech and language. No gross focal neurologic deficits are appreciated.  Skin:  Skin is warm, dry and intact.  No ecchymosis or abrasions are noted. Psychiatric: Mood and affect are normal. Speech and behavior are normal.  ____________________________________________   LABS (all labs ordered are listed, but only abnormal results are displayed)  Labs Reviewed - No data to display  RADIOLOGY  ED MD  interpretation:   Left foot x-ray is negative for fracture.  Official radiology report(s): Dg Foot Complete Left  Result Date: 05/09/2017 CLINICAL DATA:  Direct trauma from a. wood splinter EXAM: LEFT FOOT - COMPLETE 3+ VIEW COMPARISON:  None in PACs FINDINGS: The bones are subjectively adequately mineralized. There is no acute or healing fracture. There is no significant arthropathic change. No soft tissue foreign bodies are observed. No soft tissue gas collections are demonstrated either. IMPRESSION: There is no acute bony or soft tissue abnormality of the left foot. Electronically Signed   By: David  [place] M.D.  On: 05/09/2017 09:58    ____________________________________________   PROCEDURES  Procedure(s) performed: None  Procedures  Critical Care performed: No  ____________________________________________   INITIAL IMPRESSION / ASSESSMENT AND PLAN / ED COURSE Patient was encouraged to ice and elevate his foot as needed for swelling or pain.  He was placed in a postop shoe after his foot was wrapped with an Ace wrap.  Patient was given Norco while in the department and a prescription for the same.  ____________________________________________   FINAL CLINICAL IMPRESSION(S) / ED DIAGNOSES  Final diagnoses:  Contusion of left foot, initial encounter     ED Discharge Orders        Ordered    HYDROcodone-acetaminophen (NORCO/VICODIN) 5-325 MG tablet  Every 6 hours PRN     05/09/17 1036       Note:  This document was prepared using Dragon voice recognition software and may include unintentional dictation errors.    Tommi RumpsSummers, Trystian Crisanto L, PA-C 05/09/17 1400    Governor RooksLord, Rebecca, MD 05/09/17 [phone]

## 2017-05-09 NOTE — ED Notes (Signed)
See triage note  States he was using a wood chipper yesterday and a large piece kicked out landing on left foot

## 2017-09-06 ENCOUNTER — Emergency Department
Admission: EM | Admit: 2017-09-06 | Discharge: 2017-09-06 | Disposition: A | Discharge status: Home / Self Care | Payer: Self-pay | Attending: Emergency Medicine | Admitting: Emergency Medicine

## 2017-09-06 ENCOUNTER — Other Ambulatory Visit: Payer: Self-pay

## 2017-09-06 ENCOUNTER — Emergency Department: Payer: Self-pay

## 2017-09-06 DIAGNOSIS — F17210 Nicotine dependence, cigarettes, uncomplicated: Secondary | ICD-10-CM | POA: Insufficient documentation

## 2017-09-06 DIAGNOSIS — I10 Essential (primary) hypertension: Secondary | ICD-10-CM | POA: Insufficient documentation

## 2017-09-06 DIAGNOSIS — M79671 Pain in right foot: Secondary | ICD-10-CM | POA: Insufficient documentation

## 2017-09-06 DIAGNOSIS — E039 Hypothyroidism, unspecified: Secondary | ICD-10-CM | POA: Insufficient documentation

## 2017-09-06 DIAGNOSIS — Z79899 Other long term (current) drug therapy: Secondary | ICD-10-CM | POA: Insufficient documentation

## 2017-09-06 MED ORDER — HYDROCODONE-ACETAMINOPHEN 5-325 MG PO TABS: 1.0000 | ORAL_TABLET | Freq: Four times a day (QID) | ORAL | 0 refills | Status: DC | PRN

## 2017-09-06 MED ORDER — HYDROCODONE-ACETAMINOPHEN 5-325 MG PO TABS
1.0000 | ORAL_TABLET | Freq: Once | ORAL | Status: AC
  Administered 2017-09-06: 1 via ORAL
  Filled 2017-09-06: qty 1

## 2017-09-06 MED ORDER — MELOXICAM 15 MG PO TABS: 15.0000 mg | ORAL_TABLET | Freq: Every day | ORAL | 0 refills | Status: AC

## 2017-09-06 NOTE — Discharge Instructions (Signed)
Ice and elevate your foot as needed for pain and swelling.  [facility] any [facility].  Begin taking meloxicam 50 mg 1 daily every day for the next 30 days.  Norco 1 every 6 hours as needed for moderate pain.

## 2017-09-06 NOTE — ED Triage Notes (Signed)
Pt c/o right foot pain for the past week, states he has a tends to jump off the last couple of steps of ladder with the work he does and is unsure if he injured it that way. No noted deformity or swelling noted.

## 2017-09-06 NOTE — ED Provider Notes (Signed)
[facility] Emergency Department Provider Note  ____________________________________________   First MD Initiated Contact with Patient 09/06/17 0848     (approximate)  I have reviewed the triage vital signs and the nursing notes.   HISTORY  Chief Complaint Foot Pain  HPI Juan Wood is a 62 y.o. male is here with complaint of right foot pain.  Patient states that he is up and down ladders and developed pain on the lateral aspect of his foot.  He has taken an occasional Tylenol or ibuprofen without any relief.  He denies any recent trauma to his foot.  Rates his pain as a 10/10.  Past Medical History:  Diagnosis Date  . Alcohol abuse   . Allergy    Hay fever  . Depression   . Drug abuse (HCC)   . Hepatitis C   . Hypertension   . Lower back pain   . Pulmonary embolism (HCC)   . Thyroid disease     Patient Active Problem List   Diagnosis Date Noted  . Low back pain 01/26/2016  . Raynaud phenomenon 01/26/2016  . Right hand pain 01/26/2016  . Headache 08/11/2015  . Neck pain 08/11/2015  . Acute maxillary sinusitis 08/11/2015  . Essential hypertension 08/11/2015  . LVH (left ventricular hypertrophy) 08/11/2015  . Acute renal insufficiency 08/11/2015  . Dehydration 08/11/2015  . Hypokalemia 08/11/2015  . Elevated transaminase level 08/11/2015  . Thrombocytopenia (HCC) 08/11/2015  . Syncope 08/10/2015  . Hepatitis C 05/25/2015  . Alcohol dependence with alcohol-induced mood disorder (HCC)   . 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 Tobacco use disorder 05/23/2015  . Hypothyroidism 05/23/2015  . HTN (hypertension) 05/23/2015  . GERD (gastroesophageal reflux disease) 05/23/2015  . Alcohol abuse with intoxication (HCC) 05/23/2015    Past Surgical History:  Procedure Laterality Date  . HERNIA REPAIR    . KNEE  SURGERY     right    Prior to Admission medications   Medication Sig Start Date End Date Taking? Authorizing Provider  amLODipine (NORVASC) 10 MG tablet TAKE 1 TABLET BY MOUTH EVERY DAY. 03/14/16   Zachery Dauer, FNP  HYDROcodone-acetaminophen (NORCO/VICODIN) 5-325 MG tablet Take 1 tablet by mouth every 6 (six) hours as needed for moderate pain. 09/06/17   Tommi Rumps, PA-C  levothyroxine (SYNTHROID, LEVOTHROID) 25 MCG tablet TAKE ONE TABLET BY MOUTH EVERY DAY 03/14/16   Zachery Dauer, FNP  losartan (COZAAR) 50 MG tablet TAKE 1/2 TABLET BY MOUTH EVERY DAY 04/05/16   McGowan, Carollee Herter A, PA-C  meloxicam (MOBIC) 15 MG tablet Take 1 tablet (15 mg total) by mouth daily. 09/06/17 09/06/18  Tommi Rumps, PA-C  omeprazole (PRILOSEC) 20 MG capsule TAKE ONE CAPSULE BY MOUTH EVERY DAY 06/12/16   Virl Axe, MD  sildenafil (REVATIO) 20 MG tablet Take 1 tablet (20 mg total) by mouth 3 (three) times daily. 12/02/15   Michiel Cowboy A, PA-C  traZODone (DESYREL) 50 MG tablet Take 1-2 tablets (50-100 mg total) by mouth at bedtime as needed for sleep. 08/11/15   Katharina Caper, MD    Allergies Patient has no known allergies.  Family History  Problem Relation Age of Onset  . Heart attack Father        x 2  . Cancer Father        Pancreatic  . Heart attack Mother   . Hypertension  Mother   . Cancer Mother        Skin    Social History Social History   Tobacco Use  . Smoking status: Current Every Day Smoker    Packs/day: 0.50    Years: 35.00    Pack years: 17.50    Types: Cigarettes  . Smokeless tobacco: Never Used  Substance Use Topics  . Alcohol use: No    Alcohol/week: 7.2 oz    Types: 12 Cans of beer per week    Comment: occasional  . Drug use: Yes    Types: Cocaine, Opium, Marijuana    Review of Systems Constitutional: No fever/chills Cardiovascular: Denies chest pain. Respiratory: Denies shortness of breath. Musculoskeletal: Positive for right foot pain. Skin: Negative for  ecchymosis or abrasions. Neurological: Negative for headaches, focal weakness or numbness. ____________________________________________   PHYSICAL EXAM:  VITAL SIGNS: ED Triage Vitals  Enc Vitals Group     BP 09/06/17 0811 (!) 169/90     Pulse Rate 09/06/17 0811 65     Resp 09/06/17 0811 16     Temp 09/06/17 0814 (!) 97.5 F (36.4 C)     Temp Source 09/06/17 0811 Oral     SpO2 09/06/17 0811 100 %     Weight 09/06/17 0812 135 lb (61.2 kg)     Height 09/06/17 0812 5\' 9"  (1.753 m)     Head Circumference --      Peak Flow --      Pain Score 09/06/17 0811 10     Pain Loc --      Pain Edu? --      Excl. in GC? --    Constitutional: Alert and oriented. Well appearing and in no acute distress. Eyes: Conjunctivae are normal.  Head: Atraumatic. Neck: No stridor.   Cardiovascular: Normal rate, regular rhythm. Grossly normal heart sounds.  Good peripheral circulation. Respiratory: Normal respiratory effort.  No retractions. Lungs CTAB. Musculoskeletal: Examination of right foot there is no gross deformity noted.  There is some minimal tenderness on palpation of the distal fifth metatarsal.  No deformity is appreciated.  Skin is intact.  No erythema or abrasions were noted.  Capillary refill is less than 3 seconds. Neurologic:  Normal speech and language. No gross focal neurologic deficits are appreciated. No gait instability. Skin:  Skin is warm, dry and intact.  No erythema, ecchymosis, or abrasions noted. Psychiatric: Mood and affect are normal. Speech and behavior are normal.  ____________________________________________   LABS (all labs ordered are listed, but only abnormal results are displayed)  Labs Reviewed - No data to display  RADIOLOGY  ED MD interpretation:  Right foot x-ray is negative for acute changes.  Official radiology report(s): Dg Foot Complete Right  Result Date: 09/06/2017 CLINICAL DATA:  Foot pain EXAM: RIGHT FOOT COMPLETE - 3+ VIEW COMPARISON:   04/05/2011 FINDINGS: There is no evidence of fracture or dislocation. There is no evidence of arthropathy or other focal bone abnormality. Soft tissues are unremarkable. Small ossicle posterior to the ankle joint unchanged from the prior study. IMPRESSION: Negative. Electronically Signed   By: Marlan Palau M.D.   On: 09/06/2017 09:30  ____________________________________________   PROCEDURES  Procedure(s) performed: None  Procedures  Critical Care performed: No  ____________________________________________   INITIAL IMPRESSION / ASSESSMENT AND PLAN / ED COURSE  As part of my medical decision making, I reviewed the following data within the electronic MEDICAL RECORD NUMBER Notes from prior ED visits and [place] Controlled Substance Database  Patient  is here with complaint of right foot pain.  He was placed in a in a postop shoe and given a Norco while in the department.  Patient was discharged with a prescription for meloxicam 50 mg 1 daily with food.  He also has an appointment for Norco 1 every 6 hours as needed for moderate pain #12.  He is to [facility] or doctor of his choice if any continued problems.  ____________________________________________   FINAL CLINICAL IMPRESSION(S) / ED DIAGNOSES  Final diagnoses:  Foot pain, right     ED Discharge Orders        Ordered    meloxicam (MOBIC) 15 MG tablet  Daily     09/06/17 1002    HYDROcodone-acetaminophen (NORCO/VICODIN) 5-325 MG tablet  Every 6 hours PRN     09/06/17 1002       Note:  This document was prepared using Dragon voice recognition software and may include unintentional dictation errors.    Tommi RumpsSummers, Rhonda L, PA-C 09/06/17 1151    Dionne BucySiadecki, Sebastian, MD 09/06/17 1425

## 2017-09-06 NOTE — ED Notes (Signed)
Lateral right foot pain. Outer foot painful to touch. Pt denies acute injury.

## 2017-10-26 ENCOUNTER — Emergency Department
Admission: EM | Admit: 2017-10-26 | Discharge: 2017-10-27 | Disposition: A | Discharge status: Rehabilitation Facility | Payer: Self-pay | Attending: Emergency Medicine | Admitting: Emergency Medicine

## 2017-10-26 ENCOUNTER — Other Ambulatory Visit: Payer: Self-pay

## 2017-10-26 ENCOUNTER — Encounter: Payer: Self-pay | Admitting: Emergency Medicine

## 2017-10-26 ENCOUNTER — Emergency Department: Payer: Self-pay

## 2017-10-26 DIAGNOSIS — F17210 Nicotine dependence, cigarettes, uncomplicated: Secondary | ICD-10-CM | POA: Insufficient documentation

## 2017-10-26 DIAGNOSIS — R1032 Left lower quadrant pain: Secondary | ICD-10-CM | POA: Insufficient documentation

## 2017-10-26 DIAGNOSIS — E039 Hypothyroidism, unspecified: Secondary | ICD-10-CM | POA: Insufficient documentation

## 2017-10-26 DIAGNOSIS — F329 Major depressive disorder, single episode, unspecified: Secondary | ICD-10-CM | POA: Insufficient documentation

## 2017-10-26 DIAGNOSIS — I10 Essential (primary) hypertension: Secondary | ICD-10-CM | POA: Insufficient documentation

## 2017-10-26 DIAGNOSIS — F1910 Other psychoactive substance abuse, uncomplicated: Secondary | ICD-10-CM | POA: Insufficient documentation

## 2017-10-26 DIAGNOSIS — Z79899 Other long term (current) drug therapy: Secondary | ICD-10-CM | POA: Insufficient documentation

## 2017-10-26 DIAGNOSIS — F10920 Alcohol use, unspecified with intoxication, uncomplicated: Secondary | ICD-10-CM | POA: Insufficient documentation

## 2017-10-26 LAB — ETHANOL: Alcohol, Ethyl (B): 194 mg/dL — ABNORMAL HIGH (ref <10)

## 2017-10-26 LAB — COMPREHENSIVE METABOLIC PANEL
ALT: 98 U/L — ABNORMAL HIGH (ref 0–44)
ANION GAP: 5 (ref 5–15)
AST: 77 U/L — ABNORMAL HIGH (ref 15–41)
Albumin: 4.2 g/dL (ref 3.5–5.0)
Alkaline Phosphatase: 57 U/L (ref 38–126)
BUN: 13 mg/dL (ref 8–23)
CO2: 29 mmol/L (ref 22–32)
Calcium: 9.3 mg/dL (ref 8.9–10.3)
Chloride: 108 mmol/L (ref 98–111)
Creatinine, Ser: 0.78 mg/dL (ref 0.61–1.24)
GFR calc non Af Amer: >60 mL/min (ref >60)
Glucose, Bld: 96 mg/dL (ref 70–99)
Potassium: 3.9 mmol/L (ref 3.5–5.1)
SODIUM: 142 mmol/L (ref 135–145)
TOTAL PROTEIN: 7.6 g/dL (ref 6.5–8.1)
Total Bilirubin: 0.5 mg/dL (ref 0.3–1.2)

## 2017-10-26 LAB — CBC
HCT: 42.2 % (ref 40.0–52.0)
Hemoglobin: 14.3 g/dL (ref 13.0–18.0)
MCH: 32.7 pg (ref 26.0–34.0)
MCHC: 34 g/dL (ref 32.0–36.0)
MCV: 96.3 fL (ref 80.0–100.0)
Platelets: 108 10*3/uL — ABNORMAL LOW (ref 150–440)
RBC: 4.38 MIL/uL — AB (ref 4.40–5.90)
RDW: 13.4 % (ref 11.5–14.5)
WBC: 4.8 10*3/uL (ref 3.8–10.6)

## 2017-10-26 LAB — URINALYSIS, COMPLETE (UACMP) WITH MICROSCOPIC
BILIRUBIN URINE: NEGATIVE
Bacteria, UA: NONE SEEN
GLUCOSE, UA: NEGATIVE mg/dL
HGB URINE DIPSTICK: NEGATIVE
Ketones, ur: NEGATIVE mg/dL
Leukocytes, UA: NEGATIVE
Nitrite: NEGATIVE
PH: 6 (ref 5.0–8.0)
Protein, ur: NEGATIVE mg/dL
SPECIFIC GRAVITY, URINE: 1.002 — AB (ref 1.005–1.030)
Squamous Epithelial / LPF: NONE SEEN (ref 0–5)
WBC, UA: NONE SEEN WBC/hpf (ref 0–5)

## 2017-10-26 LAB — URINE DRUG SCREEN, QUALITATIVE (ARMC ONLY)
AMPHETAMINES, UR SCREEN: NOT DETECTED
Barbiturates, Ur Screen: NOT DETECTED
Cannabinoid 50 Ng, Ur ~~LOC~~: NOT DETECTED
Cocaine Metabolite,Ur ~~LOC~~: POSITIVE — AB
MDMA (ECSTASY) UR SCREEN: NOT DETECTED
Methadone Scn, Ur: NOT DETECTED
Opiate, Ur Screen: NOT DETECTED
Phencyclidine (PCP) Ur S: NOT DETECTED
TRICYCLIC, UR SCREEN: NOT DETECTED

## 2017-10-26 MED ORDER — VITAMIN B-1 100 MG PO TABS
ORAL_TABLET | ORAL | Status: AC
  Administered 2017-10-26: 100 mg via ORAL
  Filled 2017-10-26: qty 1

## 2017-10-26 MED ORDER — LORAZEPAM 2 MG/ML IJ SOLN: 0.0000 mg | Freq: Two times a day (BID) | INTRAMUSCULAR | Status: DC

## 2017-10-26 MED ORDER — THIAMINE HCL 100 MG/ML IJ SOLN: 100.0000 mg | Freq: Every day | INTRAMUSCULAR | Status: DC

## 2017-10-26 MED ORDER — LORAZEPAM 2 MG PO TABS
0.0000 mg | ORAL_TABLET | Freq: Four times a day (QID) | ORAL | Status: DC
  Administered 2017-10-26: 2 mg via ORAL
  Filled 2017-10-26 (×2): qty 1

## 2017-10-26 MED ORDER — LORAZEPAM 2 MG/ML IJ SOLN: 0.0000 mg | Freq: Four times a day (QID) | INTRAMUSCULAR | Status: DC

## 2017-10-26 MED ORDER — VITAMIN B-1 100 MG PO TABS
100.0000 mg | ORAL_TABLET | Freq: Every day | ORAL | Status: DC
  Administered 2017-10-26 – 2017-10-27 (×2): 100 mg via ORAL
  Filled 2017-10-26: qty 1

## 2017-10-26 MED ORDER — LORAZEPAM 2 MG PO TABS
0.0000 mg | ORAL_TABLET | Freq: Two times a day (BID) | ORAL | Status: DC
  Administered 2017-10-27: 2 mg via ORAL

## 2017-10-26 NOTE — ED Notes (Signed)
Pt given warm blanket.

## 2017-10-26 NOTE — ED Provider Notes (Signed)
[facility] Emergency Department Provider Note ____________________________________________   First MD Initiated Contact with Patient 10/26/17 1955     (approximate)  I have reviewed the triage vital signs and the nursing notes.   HISTORY  Chief Complaint Alcohol Problem and Drug Problem   HPI Melanee SpryDonald F Pawlicki is a 62 y.o. male is a 62 year old male with a history of alcohol and polysubstance abuse was presenting to the emergency department today seeking detox from alcohol and crack cocaine.  Patient says that he had been clean for 2 years and then several months ago started using crack cocaine as well as drinking alcohol.  He says his last drink was earlier this afternoon.  States that when he has stopped before he has gotten very shaky.  He says that tonight he finally decided to seek help because he had a fight with his wife and feels like his life is spinning out of control and he needs help.  He is also reporting a crampy left lower quadrant abdominal pain that is nonradiating.  Denies any nausea vomiting or diarrhea.  It does not radiate into his penis or testicles.  Denies any burning with urination.  Says that the pain started several hours ago.  Describes as a 10 out of 10.   Past Medical History:  Diagnosis Date  . Alcohol abuse   . Allergy    Hay fever  . Depression   . Drug abuse (HCC)   . Hepatitis C   . Hypertension   . Lower back pain   . Pulmonary embolism (HCC)   . Thyroid disease     Patient Active Problem List   Diagnosis Date Noted  . Low back pain 01/26/2016  . Raynaud phenomenon 01/26/2016  . Right hand pain 01/26/2016  . Headache 08/11/2015  . Neck pain 08/11/2015  . Acute maxillary sinusitis 08/11/2015  . Essential hypertension 08/11/2015  . LVH (left ventricular hypertrophy) 08/11/2015  . Acute renal insufficiency 08/11/2015  . Dehydration 08/11/2015  . Hypokalemia 08/11/2015  . Elevated transaminase level 08/11/2015  .  Thrombocytopenia (HCC) 08/11/2015  . Syncope 08/10/2015  . Hepatitis C 05/25/2015  . Alcohol dependence with alcohol-induced mood disorder (HCC)   . 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 Tobacco use disorder 05/23/2015  . Hypothyroidism 05/23/2015  . HTN (hypertension) 05/23/2015  . GERD (gastroesophageal reflux disease) 05/23/2015  . Alcohol abuse with intoxication (HCC) 05/23/2015    Past Surgical History:  Procedure Laterality Date  . HERNIA REPAIR    . KNEE SURGERY     right    Prior to Admission medications   Medication Sig Start Date End Date Taking? Authorizing Provider  amLODipine (NORVASC) 10 MG tablet TAKE 1 TABLET BY MOUTH EVERY DAY. 03/14/16   Zachery Dauerdem, Donna S, FNP  HYDROcodone-acetaminophen (NORCO/VICODIN) 5-325 MG tablet Take 1 tablet by mouth every 6 (six) hours as needed for moderate pain. 09/06/17   Tommi RumpsSummers, Rhonda L, PA-C  levothyroxine (SYNTHROID, LEVOTHROID) 25 MCG tablet TAKE ONE TABLET BY MOUTH EVERY DAY 03/14/16   Zachery Dauerdem, Donna S, FNP  losartan (COZAAR) 50 MG tablet TAKE 1/2 TABLET BY MOUTH EVERY DAY 04/05/16   McGowan, Carollee HerterShannon A, PA-C  meloxicam (MOBIC) 15 MG tablet Take 1 tablet (15 mg total) by mouth daily. 09/06/17 09/06/18  Tommi RumpsSummers, Rhonda L, PA-C  omeprazole (PRILOSEC) 20 MG capsule TAKE ONE CAPSULE BY MOUTH EVERY DAY 06/12/16  Virl Axehaplin, Don C, MD  sildenafil (REVATIO) 20 MG tablet Take 1 tablet (20 mg total) by mouth 3 (three) times daily. 12/02/15   Michiel CowboyMcGowan, Shannon A, PA-C  traZODone (DESYREL) 50 MG tablet Take 1-2 tablets (50-100 mg total) by mouth at bedtime as needed for sleep. 08/11/15   Katharina CaperVaickute, Rima, MD    Allergies Patient has no known allergies.  Family History  Problem Relation Age of Onset  . Heart attack Father        x 2  . Cancer Father        Pancreatic  . Heart attack Mother   . Hypertension Mother   . Cancer  Mother        Skin    Social History Social History   Tobacco Use  . Smoking status: Current Every Day Smoker    Packs/day: 0.50    Years: 35.00    Pack years: 17.50    Types: Cigarettes  . Smokeless tobacco: Never Used  Substance Use Topics  . Alcohol use: No    Alcohol/week: 12.0 standard drinks    Types: 12 Cans of beer per week    Comment: occasional  . Drug use: Yes    Types: Cocaine, Opium, Marijuana    Review of Systems  Constitutional: No fever/chills Eyes: No visual changes. ENT: No sore throat. Cardiovascular: Denies chest pain. Respiratory: Denies shortness of breath. Gastrointestinal:  No nausea, no vomiting.  No diarrhea.  No constipation. Genitourinary: Negative for dysuria. Musculoskeletal: Negative for back pain. Skin: Negative for rash. Neurological: Negative for headaches, focal weakness or numbness.   ____________________________________________   PHYSICAL EXAM:  VITAL SIGNS: ED Triage Vitals  Enc Vitals Group     BP 10/26/17 1834 (!) 149/82     Pulse Rate 10/26/17 1834 73     Resp 10/26/17 1834 16     Temp 10/26/17 1834 97.7 F (36.5 C)     Temp Source 10/26/17 1834 Oral     SpO2 10/26/17 1834 100 %     Weight 10/26/17 1835 130 lb (59 kg)     Height 10/26/17 1835 5\' 9"  (1.753 m)     Head Circumference --      Peak Flow --      Pain Score 10/26/17 1837 10     Pain Loc --      Pain Edu? --      Excl. in GC? --     Constitutional: Alert and oriented. in no acute distress. Eyes: Conjunctivae are normal.  Head: Atraumatic. Nose: No congestion/rhinnorhea. Mouth/Throat: Mucous membranes are moist.  Neck: No stridor.   Cardiovascular: Normal rate, regular rhythm. Grossly normal heart sounds.  Good peripheral circulation. Respiratory: Normal respiratory effort.  No retractions. Lungs CTAB. Gastrointestinal: Soft with moderate left lower quadrant tenderness to palpation without any rebound or guarding.  There is no hernia that is easy to  visualize on gross examination nor do I palpate a defect or hernia bulge. No distention. No CVA tenderness. Musculoskeletal: No lower extremity tenderness nor edema.  No joint effusions. Neurologic:  Normal speech and language. No gross focal neurologic deficits are appreciated. Skin:  Skin is warm, dry and intact. No rash noted. Psychiatric: Mood and affect are normal. Speech and behavior are normal.  ____________________________________________   LABS (all labs ordered are listed, but only abnormal results are displayed)  Labs Reviewed  COMPREHENSIVE METABOLIC PANEL - Abnormal; Notable for the following components:      Result Value   AST 77 (*)  ALT 98 (*)    All other components within normal limits  ETHANOL - Abnormal; Notable for the following components:   Alcohol, Ethyl (B) 194 (*)    All other components within normal limits  CBC - Abnormal; Notable for the following components:   RBC 4.38 (*)    Platelets 108 (*)    All other components within normal limits  URINE DRUG SCREEN, QUALITATIVE ([facility] ONLY) - Abnormal; Notable for the following components:   Cocaine Metabolite,Ur Plain POSITIVE (*)    Benzodiazepine, Ur Scrn TEST NOT PERFORMED, REAGENT NOT AVAILABLE (*)    All other components within normal limits  URINALYSIS, COMPLETE (UACMP) WITH MICROSCOPIC - Abnormal; Notable for the following components:   Color, Urine COLORLESS (*)    APPearance CLEAR (*)    Specific Gravity, Urine 1.002 (*)    All other components within normal limits   ____________________________________________  EKG   ____________________________________________  RADIOLOGY  CT scan of the abdomen is limited but without any obvious acute process. ____________________________________________   PROCEDURES  Procedure(s) performed:   Procedures  Critical Care performed:   ____________________________________________   INITIAL IMPRESSION / ASSESSMENT AND PLAN / ED COURSE  Pertinent  labs & imaging results that were available during my care of the patient were reviewed by me and considered in my medical decision making (see chart for details).  Differential diagnosis includes, but is not limited to, acute appendicitis, renal colic, testicular torsion, urinary tract infection/pyelonephritis, prostatitis,  epididymitis, diverticulitis, small bowel obstruction or ileus, colitis, abdominal aortic aneurysm, gastroenteritis, hernia, etc. Polysubstance abuse As part of my medical decision making, I reviewed the following data within the electronic MEDICAL RECORD NUMBER Notes from prior ED visits  ----------------------------------------- 10:59 PM on 10/26/2017 -----------------------------------------  Patient has been evaluated by [facility] and while [facility] is full and not accepting patients the patient has been accepted to [facility] but cannot go there until the morning when there is a nurse present.  Because the [facility] is out of [place] the patient needs to provide his own transport but he says he has no way of getting into [facility] nor is he able to go back to his home because he says that he is unwilling to go home where his wife is living.  He says that he does not have any relatives home where he can go to this evening.  He is saying that he is essentially homeless at this point.  He will be transferred to the [facility].  A social work consult has been ordered.  CIWA protocol in place.  Reassuring lab work as well as CT scan.  We will continue to observe abdominal pain. ____________________________________________   FINAL CLINICAL IMPRESSION(S) / ED DIAGNOSES  Left lower quadrant abdominal pain.  Alcohol intoxication.  Polysubstance abuse.  NEW MEDICATIONS STARTED DURING THIS VISIT:  New Prescriptions   No medications on file     Note:  This document was prepared using Dragon voice recognition software and may include unintentional dictation errors.     Myrna Blazer, MD 10/26/17 2300

## 2017-10-26 NOTE — BH Assessment (Addendum)
Writer discussed with the patient about the option for detox/treatment with [facility].  Patient was in the agreement with the plan.  Patient signed the Release of information, in order to send labs and ER Note.  Information sent sent to [facility] via fax and confirmed it was received (Ms. Bufford ButtnerWorley [phone]) Pending Review at this time If accepted pt would be required to find his own transportation. This Clinical research associatewriter spoke with the ED CN and she stated that a transportation ticket is not an option for this pt.   Pt denies SI/HI A/V H/D

## 2017-10-26 NOTE — ED Triage Notes (Addendum)
Pt presents to ED via POV requesting detox from alcohol and cocaine. Last used both a few hours ago. Pt reports daily drinking 4 40oz beers x9 mos. Pt states he has had tremors when quitting drinking before. Hx hep C. C/o LLQ abd pain. Pt denies SI/HI. States he has been getting in arguments with spouse who also smokes crack because he wants her to get detox as well.

## 2017-10-26 NOTE — ED Notes (Signed)
[facility]

## 2017-10-26 NOTE — BH Assessment (Signed)
Patient has been accepted to [facility].  Representative was H&R BlockCarol Worley.  Pt responsible for his own transportation.  Pt can arrive tomorrow (10/27/17) after 9 am  ER Staff is aware of it:  Dr. Pershing ProudSchaevitz, ER MD    [facility] [address],  [place], [place] [id number] [phone]

## 2017-10-27 NOTE — ED Notes (Signed)
Patient discharged to [facility] by [facility], patient/[facility] staff received discharge and transfer papers. Patient received belongings and verbalized he has received all of his belongings. Patient appropriate and cooperative, Denies SI/HI AVH. Vital signs taken. NAD noted.  Mrs. Worley ([facility]-shift lead) called back and stated that if the patient needs crisis assistance he will be on an Emergency Libium protocol, they do not offer Ativan.

## 2017-10-27 NOTE — ED Notes (Signed)
Hourly rounding reveals patient sleeping in room. No complaints, stable, in no acute distress. Q15 minute rounds and monitoring via Security Cam[employer] to continue. 

## 2017-10-27 NOTE — BH Assessment (Signed)
Writer received a phone call from ER Charge Viviann Spare(Steven) asking about the accepting physician.  Writer made multiple attempts to contact Accepting Facility, [facility] [phone]) but continued to get a busy signal. Print production plannerWriter contacted Charge Nurse and updated him on the status. Writer will continue to call to try to get the  information.  Writer called and was able to speak with a staff member [facility]. Staff member stated the supervisor was on the phone with someone at [facility] an advised to call back to get the information.  Writer received phone call from ER [facility](Ronnie) stating he had spoken with [facility] and received the information.

## 2017-10-27 NOTE — ED Provider Notes (Signed)
Vitals:   10/27/17 0843 10/27/17 0847  BP: 130/80 127/81  Pulse: 70 67  Resp: 16   Temp: 98.1 F (36.7 C)   SpO2: 99%     Examined. Patient sitting at the side of the bed, no distress.  Resting comfortably.  Patient agreeable with plan for transfer.  Understanding of transfer. Being transported by [facility]. Accepted by Sanjuana KavaPaula Sherman MD.    Sharyn CreamerQuale, Analeigha Nauman, MD 10/27/17 [phone]

## 2017-10-27 NOTE — ED Notes (Signed)
Pt sleeping.  Breakfast tray left at bedside.

## 2017-10-27 NOTE — ED Notes (Signed)

## 2017-10-27 NOTE — ED Notes (Signed)
Called [facility] several times at the [phone] number and at the Crisis number phone numbers are giving a busy signal. Will continue to try. Charge nurse notified.   Crisis Unit [phone]

## 2017-10-27 NOTE — ED Provider Notes (Signed)
-----------------------------------------   7:17 AM on 10/27/2017 -----------------------------------------   Blood pressure 132/88, pulse 65, temperature 97.7 F (36.5 C), temperature source Oral, resp. rate 18, height 5\' 9"  (1.753 m), weight 59 kg, SpO2 100 %.  The patient had no acute events since last update.  Calm and cooperative at this time.  Disposition is pending Psychiatry/Behavioral Medicine team recommendations.     Irean HongSung, Jade J, MD 10/27/17 [phone]

## 2017-10-27 NOTE — Progress Notes (Signed)
LCSW consulted with ED RN and patient is discharging to [facility]. LCSW spoke to [facility]TS and there is no need for LCSW social work placement at this time. LCSW will be available should the patient require further assistance.  Delta Air LinesClaudine Thurl Boen LCSW [phone]

## 2017-10-27 NOTE — ED Notes (Signed)
Spoke to [employer]Carol Worley shift lead at [facility] and she stated that patient needs to bring his own medication due to the fact that there will be no doctor coming in today. Informed charge nurse, also was told by Mrs. Bufford ButtnerWorley that whoever drops patient off needs to pick him up.

## 2017-10-27 NOTE — ED Notes (Signed)
EMTALA reviewed by this RN.  

## 2017-10-27 NOTE — ED Notes (Signed)
Report to Claris CheMargaret, [facility], pt chart and hx already reviewed, pt dressed into behavioral scrubs, pt cold - kept blanket, 1 large blue laundry bag contains pt possessions, moved with pt to locker room

## 2017-12-13 ENCOUNTER — Emergency Department: Payer: Self-pay

## 2017-12-13 ENCOUNTER — Emergency Department
Admission: EM | Admit: 2017-12-13 | Discharge: 2017-12-13 | Disposition: A | Discharge status: Home / Self Care | Payer: Self-pay | Attending: Emergency Medicine | Admitting: Emergency Medicine

## 2017-12-13 DIAGNOSIS — Y999 Unspecified external cause status: Secondary | ICD-10-CM | POA: Insufficient documentation

## 2017-12-13 DIAGNOSIS — Z79899 Other long term (current) drug therapy: Secondary | ICD-10-CM | POA: Insufficient documentation

## 2017-12-13 DIAGNOSIS — I10 Essential (primary) hypertension: Secondary | ICD-10-CM | POA: Insufficient documentation

## 2017-12-13 DIAGNOSIS — S39012A Strain of muscle, fascia and tendon of lower back, initial encounter: Secondary | ICD-10-CM | POA: Insufficient documentation

## 2017-12-13 DIAGNOSIS — E039 Hypothyroidism, unspecified: Secondary | ICD-10-CM | POA: Insufficient documentation

## 2017-12-13 DIAGNOSIS — F17210 Nicotine dependence, cigarettes, uncomplicated: Secondary | ICD-10-CM | POA: Insufficient documentation

## 2017-12-13 DIAGNOSIS — M5136 Other intervertebral disc degeneration, lumbar region: Secondary | ICD-10-CM | POA: Insufficient documentation

## 2017-12-13 DIAGNOSIS — Z7982 Long term (current) use of aspirin: Secondary | ICD-10-CM | POA: Insufficient documentation

## 2017-12-13 DIAGNOSIS — Y9289 Other specified places as the place of occurrence of the external cause: Secondary | ICD-10-CM | POA: Insufficient documentation

## 2017-12-13 DIAGNOSIS — X500XXA Overexertion from strenuous movement or load, initial encounter: Secondary | ICD-10-CM | POA: Insufficient documentation

## 2017-12-13 DIAGNOSIS — Y9389 Activity, other specified: Secondary | ICD-10-CM | POA: Insufficient documentation

## 2017-12-13 MED ORDER — TRAMADOL HCL 50 MG PO TABS: 50.0000 mg | ORAL_TABLET | Freq: Four times a day (QID) | ORAL | 0 refills | Status: DC | PRN

## 2017-12-13 MED ORDER — PREDNISONE 10 MG PO TABS: ORAL_TABLET | ORAL | 0 refills | Status: DC

## 2017-12-13 MED ORDER — DEXAMETHASONE SODIUM PHOSPHATE 10 MG/ML IJ SOLN
10.0000 mg | Freq: Once | INTRAMUSCULAR | Status: AC
  Administered 2017-12-13: 10 mg via INTRAMUSCULAR
  Filled 2017-12-13: qty 1

## 2017-12-13 MED ORDER — CYCLOBENZAPRINE HCL 5 MG PO TABS: 5.0000 mg | ORAL_TABLET | Freq: Three times a day (TID) | ORAL | 0 refills | Status: DC | PRN

## 2017-12-13 MED ORDER — CYCLOBENZAPRINE HCL 10 MG PO TABS
10.0000 mg | ORAL_TABLET | Freq: Once | ORAL | Status: AC
  Administered 2017-12-13: 10 mg via ORAL
  Filled 2017-12-13: qty 1

## 2017-12-13 NOTE — ED Notes (Signed)
See triage note  States he is having mid back pain   Pain is mainly to left side   States pain started about 3 days ago.  Unsure of what he did  States he slid from roof couple of days ago and then was putting a floor down

## 2017-12-13 NOTE — ED Provider Notes (Signed)
[facility] Emergency Department Provider Note  ____________________________________________   First MD Initiated Contact with Patient 12/13/17 [phone]     (approximate)  I have reviewed the triage vital signs and the nursing notes.   HISTORY  Chief Complaint Back Pain   HPI Juan Wood is a 62 y.o. male presents to the ED with complaint of mid back pain.  Patient states that pain started approximately 3 days ago.  He states that he was lifting plywood while working on a roof.  He has a history of chronic pain.  He denies any actual injury to his back.  He denies any urinary symptoms or hematuria.  Patient has continued to ambulate without any assistance.  He has not taken any over-the-counter medication prior to arrival.  His pain is 10/10.  Past Medical History:  Diagnosis Date  . Alcohol abuse   . Allergy    Hay fever  . Depression   . Drug abuse (HCC)   . Hepatitis C   . Hypertension   . Lower back pain   . Pulmonary embolism (HCC)   . Thyroid disease     Patient Active Problem List   Diagnosis Date Noted  . Low back pain 01/26/2016  . Raynaud phenomenon 01/26/2016  . Right hand pain 01/26/2016  . Headache 08/11/2015  . Neck pain 08/11/2015  . Acute maxillary sinusitis 08/11/2015  . Essential hypertension 08/11/2015  . LVH (left ventricular hypertrophy) 08/11/2015  . Acute renal insufficiency 08/11/2015  . Dehydration 08/11/2015  . Hypokalemia 08/11/2015  . Elevated transaminase level 08/11/2015  . Thrombocytopenia (HCC) 08/11/2015  . Syncope 08/10/2015  . Hepatitis C 05/25/2015  . Alcohol dependence with alcohol-induced mood disorder (HCC)   . 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 Tobacco use disorder 05/23/2015  . Hypothyroidism 05/23/2015  . HTN (hypertension) 05/23/2015  . GERD (gastroesophageal  reflux disease) 05/23/2015  . Alcohol abuse with intoxication (HCC) 05/23/2015    Past Surgical History:  Procedure Laterality Date  . HERNIA REPAIR    . KNEE SURGERY     right    Prior to Admission medications   Medication Sig Start Date End Date Taking? Authorizing Provider  aspirin 81 MG chewable tablet Chew 81 mg by mouth daily.   Yes [provider]  amLODipine (NORVASC) 10 MG tablet TAKE 1 TABLET BY MOUTH EVERY DAY. 03/14/16   Odem, Deeann Cree, FNP  cyclobenzaprine (FLEXERIL) 5 MG tablet Take 1 tablet (5 mg total) by mouth 3 (three) times daily as needed for muscle spasms. 12/13/17   Tommi Rumps, PA-C  HYDROcodone-acetaminophen (NORCO/VICODIN) 5-325 MG tablet Take 1 tablet by mouth every 6 (six) hours as needed for moderate pain. 09/06/17   Tommi Rumps, PA-C  levothyroxine (SYNTHROID, LEVOTHROID) 25 MCG tablet TAKE ONE TABLET BY MOUTH EVERY DAY 03/14/16   Zachery Dauer, FNP  losartan (COZAAR) 50 MG tablet TAKE 1/2 TABLET BY MOUTH EVERY DAY 04/05/16   McGowan, Carollee Herter A, PA-C  meloxicam (MOBIC) 15 MG tablet Take 1 tablet (15 mg total) by mouth daily. 09/06/17 09/06/18  Tommi Rumps, PA-C  omeprazole (PRILOSEC) 20 MG capsule TAKE ONE CAPSULE BY MOUTH EVERY DAY 06/12/16   Virl Axe, MD  predniSONE (DELTASONE) 10 MG tablet Take 6 tablets  today, on day 2 take 5 tablets, day 3 take 4 tablets, day 4 take 3 tablets, day  5 take  2 tablets and 1 tablet the last day 12/13/17   Tommi Rumps, PA-C  sildenafil (REVATIO) 20 MG tablet Take 1 tablet (20 mg total) by mouth 3 (three) times daily. 12/02/15   Michiel Cowboy A, PA-C  traMADol (ULTRAM) 50 MG tablet Take 1 tablet (50 mg total) by mouth every 6 (six) hours as needed. 12/13/17   Tommi Rumps, PA-C  traZODone (DESYREL) 50 MG tablet Take 1-2 tablets (50-100 mg total) by mouth at bedtime as needed for sleep. 08/11/15   Katharina Caper, MD    Allergies Patient has no known allergies.  Family History  Problem Relation  Age of Onset  . Heart attack Father        x 2  . Cancer Father        Pancreatic  . Heart attack Mother   . Hypertension Mother   . Cancer Mother        Skin    Social History Social History   Tobacco Use  . Smoking status: Current Every Day Smoker    Packs/day: 0.50    Years: 35.00    Pack years: 17.50    Types: Cigarettes  . Smokeless tobacco: Never Used  Substance Use Topics  . Alcohol use: No    Alcohol/week: 12.0 standard drinks    Types: 12 Cans of beer per week    Comment: occasional  . Drug use: Yes    Types: Cocaine, Opium, Marijuana    Review of Systems Constitutional: No fever/chills Cardiovascular: Denies chest pain. Respiratory: Denies shortness of breath. Gastrointestinal: No abdominal pain.  No nausea, no vomiting.  Genitourinary: Negative for dysuria.  Negative for hematuria. Musculoskeletal: Positive for low back pain. Skin: Negative for rash. Neurological: Negative for headaches, focal weakness or numbness. ___________________________________________   PHYSICAL EXAM:  VITAL SIGNS: ED Triage Vitals  Enc Vitals Group     BP 12/13/17 0910 (!) 140/92     Pulse Rate 12/13/17 0910 67     Resp 12/13/17 0910 20     Temp 12/13/17 0910 98.7 F (37.1 C)     Temp Source 12/13/17 0910 Oral     SpO2 12/13/17 0910 100 %     Weight 12/13/17 0910 135 lb (61.2 kg)     Height 12/13/17 0910 5\' 10"  (1.778 m)     Head Circumference --      Peak Flow --      Pain Score 12/13/17 0916 10     Pain Loc --      Pain Edu? --      Excl. in GC? --    Constitutional: Alert and oriented. Well appearing and in no acute distress. Eyes: Conjunctivae are normal.  Head: Atraumatic. Neck: No stridor.   Cardiovascular: Normal rate, regular rhythm. Grossly normal heart sounds.  Good peripheral circulation. Respiratory: Normal respiratory effort.  No retractions. Lungs CTAB. Gastrointestinal: Soft and nontender. No distention.  No CVA tenderness. Musculoskeletal:  Examination of the back there is no gross deformity noted.  There is diffuse tenderness on palpation of the lumbar spine but no point tenderness.  No gross deformity and no soft tissue abrasions or ecchymosis noted.  No active muscle spasm seen.  Patient does guard against movement.  Patient is able to ambulate slowly without assistance.  Straight leg raises with discomfort in the L5-S1 area but is able to manage 90 degrees.  Good muscle strength bilaterally. Neurologic:  Normal speech and language. No gross focal neurologic deficits are  appreciated.  Reflexes 2+ bilaterally.  No gait instability. Skin:  Skin is warm, dry and intact.  Psychiatric: Mood and affect are normal. Speech and behavior are normal.  ____________________________________________   LABS (all labs ordered are listed, but only abnormal results are displayed)  Labs Reviewed - No data to display   RADIOLOGY  ED MD interpretation:  Lumbar spine x-ray is negative for compression fracture or acute changes.  Official radiology report(s): Dg Lumbar Spine 2-3 Views  Result Date: 12/13/2017 CLINICAL DATA:  Pain for 3 days no known injury. EXAM: LUMBAR SPINE - 2-3 VIEW COMPARISON:  12/21/2015 FINDINGS: Degenerative spurring throughout the lumbar spine. Disc space narrowing at L4-5 and L5-S1. Mild degenerative facet disease in the lower lumbar spine as well. No fracture or malalignment. SI joints are symmetric and unremarkable. IMPRESSION: Degenerative disc and facet disease in the lower lumbar spine. No acute bony abnormality. Electronically Signed   By: Charlett Nose M.D.   On: 12/13/2017 10:49    ____________________________________________   PROCEDURES  Procedure(s) performed: None  Procedures  Critical Care performed: No  ____________________________________________   INITIAL IMPRESSION / ASSESSMENT AND PLAN / ED COURSE  As part of my medical decision making, I reviewed the following data within the electronic  MEDICAL RECORD NUMBER Notes from prior ED visits and [place]  Patient presents to the ED with complaint of lumbar pain after lifting plywood.  He denies any actual fall or injury to his back.  He states he has had back problems in the past.  He denies any urinary symptoms or hematuria.  Patient was given Decadron 10 mg and Flexeril 10 mg prior to x-ray.  X-ray showed degenerative changes but no acute changes were seen.  Patient had guarded gait but was able to ambulate without assistance.  He was given a prescription for Flexeril 5 mg 1 3 times daily as needed for muscle spasms, prednisone and tramadol.  He is encouraged to use ice or heat to his back as needed for discomfort.  He is to follow-up with his PCP or [facility] if any continued problems.  ____________________________________________   FINAL CLINICAL IMPRESSION(S) / ED DIAGNOSES  Final diagnoses:  Strain of lumbar region, initial encounter  Degenerative disc disease, lumbar     ED Discharge Orders         Ordered    cyclobenzaprine (FLEXERIL) 5 MG tablet  3 times daily PRN     12/13/17 1119    predniSONE (DELTASONE) 10 MG tablet     12/13/17 1119    traMADol (ULTRAM) 50 MG tablet  Every 6 hours PRN     12/13/17 1119           Note:  This document was prepared using Dragon voice recognition software and may include unintentional dictation errors.    Tommi Rumps, PA-C 12/13/17 1517    Governor Rooks, MD 12/14/17 [phone]

## 2017-12-13 NOTE — Discharge Instructions (Addendum)
Follow-up with [facility] acute care or 1 the clinics listed on your discharge papers.  You will need to establish a primary care provider.  Begin taking medication as directed.  The prednisone is a tapering dose over the next 6 days, Flexeril 5 mg 1 3 times daily as needed for muscle spasms and tramadol every 6 hours as needed for pain.  You may use ice or heat to your back as needed for discomfort.  It may take 4 to 5 days for your back to feel better due to the strain.

## 2017-12-13 NOTE — ED Triage Notes (Addendum)
Pt to ED with c/o of lower left back pain that started 3 days ago. Pt states has been lifting plywood. Pt not ambulatory in triage. Pt states has chronic back pain but not like this.

## 2018-03-20 ENCOUNTER — Ambulatory Visit: Payer: Self-pay

## 2018-03-27 ENCOUNTER — Ambulatory Visit: Payer: Self-pay

## 2018-10-23 ENCOUNTER — Other Ambulatory Visit: Payer: Self-pay

## 2018-10-23 DIAGNOSIS — Z20822 Contact with and (suspected) exposure to covid-19: Secondary | ICD-10-CM

## 2018-10-25 LAB — NOVEL CORONAVIRUS, NAA: SARS-CoV-2, NAA: NOT DETECTED

## 2018-10-25 LAB — SPECIMEN STATUS REPORT

## 2018-10-29 ENCOUNTER — Other Ambulatory Visit: Payer: Self-pay

## 2018-10-29 ENCOUNTER — Emergency Department: Payer: Self-pay

## 2018-10-29 ENCOUNTER — Emergency Department
Admission: EM | Admit: 2018-10-29 | Discharge: 2018-10-29 | Disposition: A | Discharge status: Home / Self Care | Payer: Self-pay | Attending: Emergency Medicine | Admitting: Emergency Medicine

## 2018-10-29 ENCOUNTER — Encounter: Payer: Self-pay | Admitting: Emergency Medicine

## 2018-10-29 DIAGNOSIS — R1032 Left lower quadrant pain: Secondary | ICD-10-CM | POA: Insufficient documentation

## 2018-10-29 DIAGNOSIS — F17210 Nicotine dependence, cigarettes, uncomplicated: Secondary | ICD-10-CM | POA: Insufficient documentation

## 2018-10-29 DIAGNOSIS — I10 Essential (primary) hypertension: Secondary | ICD-10-CM | POA: Insufficient documentation

## 2018-10-29 DIAGNOSIS — E039 Hypothyroidism, unspecified: Secondary | ICD-10-CM | POA: Insufficient documentation

## 2018-10-29 DIAGNOSIS — Z7982 Long term (current) use of aspirin: Secondary | ICD-10-CM | POA: Insufficient documentation

## 2018-10-29 DIAGNOSIS — M545 Low back pain, unspecified: Secondary | ICD-10-CM

## 2018-10-29 DIAGNOSIS — Z79899 Other long term (current) drug therapy: Secondary | ICD-10-CM | POA: Insufficient documentation

## 2018-10-29 LAB — CBC
HCT: 47.3 % (ref 39.0–52.0)
Hemoglobin: 15.4 g/dL (ref 13.0–17.0)
MCH: 31.5 pg (ref 26.0–34.0)
MCHC: 32.6 g/dL (ref 30.0–36.0)
MCV: 96.7 fL (ref 80.0–100.0)
Platelets: 114 10*3/uL — ABNORMAL LOW (ref 150–400)
RBC: 4.89 MIL/uL (ref 4.22–5.81)
RDW: 13.2 % (ref 11.5–15.5)
WBC: 3.9 10*3/uL — ABNORMAL LOW (ref 4.0–10.5)
nRBC: 0 % (ref 0.0–0.2)

## 2018-10-29 LAB — BASIC METABOLIC PANEL
Anion gap: 10 (ref 5–15)
BUN: 19 mg/dL (ref 8–23)
CO2: 25 mmol/L (ref 22–32)
Calcium: 9.2 mg/dL (ref 8.9–10.3)
Chloride: 101 mmol/L (ref 98–111)
Creatinine, Ser: 0.79 mg/dL (ref 0.61–1.24)
GFR calc Af Amer: >60 mL/min (ref >60)
GFR calc non Af Amer: >60 mL/min (ref >60)
Glucose, Bld: 143 mg/dL — ABNORMAL HIGH (ref 70–99)
Potassium: 4 mmol/L (ref 3.5–5.1)
Sodium: 136 mmol/L (ref 135–145)

## 2018-10-29 LAB — URINALYSIS, COMPLETE (UACMP) WITH MICROSCOPIC
Bacteria, UA: NONE SEEN
Bilirubin Urine: NEGATIVE
Glucose, UA: NEGATIVE mg/dL
Hgb urine dipstick: NEGATIVE
Ketones, ur: NEGATIVE mg/dL
Leukocytes,Ua: NEGATIVE
Nitrite: NEGATIVE
Protein, ur: NEGATIVE mg/dL
Specific Gravity, Urine: 1.018 (ref 1.005–1.030)
Squamous Epithelial / LPF: NONE SEEN (ref 0–5)
WBC, UA: NONE SEEN WBC/hpf (ref 0–5)
pH: 6 (ref 5.0–8.0)

## 2018-10-29 MED ORDER — OXYCODONE HCL 5 MG PO TABS
5.0000 mg | ORAL_TABLET | Freq: Once | ORAL | Status: AC
  Administered 2018-10-29: 5 mg via ORAL
  Filled 2018-10-29: qty 1

## 2018-10-29 MED ORDER — ACETAMINOPHEN 500 MG PO TABS
1000.0000 mg | ORAL_TABLET | Freq: Once | ORAL | Status: AC
  Administered 2018-10-29: 1000 mg via ORAL
  Filled 2018-10-29: qty 2

## 2018-10-29 MED ORDER — PREDNISONE 10 MG (21) PO TBPK: ORAL_TABLET | ORAL | 0 refills | Status: DC

## 2018-10-29 MED ORDER — TRAMADOL HCL 50 MG PO TABS: 50.0000 mg | ORAL_TABLET | Freq: Four times a day (QID) | ORAL | 0 refills | Status: DC | PRN

## 2018-10-29 NOTE — ED Triage Notes (Addendum)
Pt arrived via POV with reports of 3 days of left back pain and left groin pain.  Pt also states he had recent stomach virus with black stools, pt states he hasnt had any black stools in 2-3 days.  Pt also reports some discomfort with urination. Pt also states he has lost about 10lbs in the last 2 weeks.  Pt states he has been around positive COVID contacts, but states he was tested and tested negative.

## 2018-10-29 NOTE — ED Provider Notes (Signed)
[facility] Emergency Department Provider Note  ____________________________________________  Time seen: Approximately 6:36 AM  I have reviewed the triage vital signs and the nursing notes.   HISTORY  Chief Complaint Back Pain and Groin Pain   HPI Juan Wood is a 63 y.o. male with a history of alcohol abuse, hepatitis C, hypertension, Raynauds, who presents for evaluation of back pain.  Patient reports the pain started 3 days ago while working in his yard.  The pain is sharp, stabbing-like, intermittent, initially located in his left buttock region but now it is mostly in the medial aspect of the proximal upper leg on the left.  No GU complaints, no dysuria or hematuria, no abdominal pain, no known trauma, no fever or chills, no saddle anesthesia, urinary or bowel incontinence or retention, weakness or numbness of his extremities.  Patient denies ever having pain like this before.  Has been using heat and cold packs at home but no other treatment.   Past Medical History:  Diagnosis Date  . Alcohol abuse   . Allergy    Hay fever  . Depression   . Drug abuse (HCC)   . Hepatitis C   . Hypertension   . Lower back pain   . Pulmonary embolism (HCC)   . Thyroid disease     Patient Active Problem List   Diagnosis Date Noted  . Low back pain 01/26/2016  . Raynaud phenomenon 01/26/2016  . Right hand pain 01/26/2016  . Headache 08/11/2015  . Neck pain 08/11/2015  . Acute maxillary sinusitis 08/11/2015  . Essential hypertension 08/11/2015  . LVH (left ventricular hypertrophy) 08/11/2015  . Acute renal insufficiency 08/11/2015  . Dehydration 08/11/2015  . Hypokalemia 08/11/2015  . Elevated transaminase level 08/11/2015  . Thrombocytopenia (HCC) 08/11/2015  . Syncope 08/10/2015  . Hepatitis C 05/25/2015  . Alcohol dependence with alcohol-induced mood disorder (HCC)   . Major depressive disorder, recurrent severe without psychotic features (HCC)  05/24/2015  . 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 Tobacco use disorder 05/23/2015  . Hypothyroidism 05/23/2015  . HTN (hypertension) 05/23/2015  . GERD (gastroesophageal reflux disease) 05/23/2015  . Alcohol abuse with intoxication (HCC) 05/23/2015    Past Surgical History:  Procedure Laterality Date  . HERNIA REPAIR    . KNEE SURGERY     right    Prior to Admission medications   Medication Sig Start Date End Date Taking? Authorizing Provider  amLODipine (NORVASC) 10 MG tablet TAKE 1 TABLET BY MOUTH EVERY DAY. 03/14/16   Zachery Dauerdem, Donna S, FNP  aspirin 81 MG chewable tablet Chew 81 mg by mouth daily.    [provider]  cyclobenzaprine (FLEXERIL) 5 MG tablet Take 1 tablet (5 mg total) by mouth 3 (three) times daily as needed for muscle spasms. 12/13/17   Tommi RumpsSummers, Rhonda L, PA-C  HYDROcodone-acetaminophen (NORCO/VICODIN) 5-325 MG tablet Take 1 tablet by mouth every 6 (six) hours as needed for moderate pain. 09/06/17   Tommi RumpsSummers, Rhonda L, PA-C  levothyroxine (SYNTHROID, LEVOTHROID) 25 MCG tablet TAKE ONE TABLET BY MOUTH EVERY DAY 03/14/16   Zachery Dauerdem, Donna S, FNP  losartan (COZAAR) 50 MG tablet TAKE 1/2 TABLET BY MOUTH EVERY DAY 04/05/16   McGowan, Carollee HerterShannon A, PA-C  omeprazole (PRILOSEC) 20 MG capsule TAKE ONE CAPSULE BY MOUTH EVERY DAY 06/12/16   Virl Axehaplin, Don C, MD  predniSONE (DELTASONE) 10 MG tablet Take 6 tablets  today, on day 2 take 5 tablets, day 3 take  4 tablets, day 4 take 3 tablets, day 5 take  2 tablets and 1 tablet the last day 12/13/17   Tommi RumpsSummers, Rhonda L, PA-C  sildenafil (REVATIO) 20 MG tablet Take 1 tablet (20 mg total) by mouth 3 (three) times daily. 12/02/15   Michiel CowboyMcGowan, Shannon A, PA-C  traMADol (ULTRAM) 50 MG tablet Take 1 tablet (50 mg total) by mouth every 6 (six) hours as needed. 12/13/17   Tommi RumpsSummers, Rhonda L, PA-C  traZODone (DESYREL) 50 MG tablet Take 1-2 tablets (50-100 mg total) by mouth at bedtime as needed for sleep. 08/11/15   Katharina CaperVaickute,  Rima, MD    Allergies Patient has no known allergies.  Family History  Problem Relation Age of Onset  . Heart attack Father        x 2  . Cancer Father        Pancreatic  . Heart attack Mother   . Hypertension Mother   . Cancer Mother        Skin    Social History Social History   Tobacco Use  . Smoking status: Current Every Day Smoker    Packs/day: 0.50    Years: 35.00    Pack years: 17.50    Types: Cigarettes  . Smokeless tobacco: Never Used  Substance Use Topics  . Alcohol use: No    Alcohol/week: 12.0 standard drinks    Types: 12 Cans of beer per week    Comment: occasional  . Drug use: Yes    Types: Cocaine, Opium, Marijuana    Review of Systems  Constitutional: Negative for fever. Eyes: Negative for visual changes. ENT: Negative for sore throat. Neck: No neck pain  Cardiovascular: Negative for chest pain. Respiratory: Negative for shortness of breath. Gastrointestinal: Negative for abdominal pain, vomiting or diarrhea. Genitourinary: Negative for dysuria. Musculoskeletal: + back pain and L leg pain Skin: Negative for rash. Neurological: Negative for headaches, weakness or numbness. Psych: No SI or HI  ____________________________________________   PHYSICAL EXAM:  VITAL SIGNS: ED Triage Vitals  Enc Vitals Group     BP 10/29/18 0612 (!) 178/90     Pulse Rate 10/29/18 0612 68     Resp 10/29/18 0612 16     Temp 10/29/18 0612 97.9 F (36.6 C)     Temp Source 10/29/18 0612 Oral     SpO2 10/29/18 0612 98 %     Weight 10/29/18 0611 122 lb (55.3 kg)     Height 10/29/18 0611 5\' 10"  (1.778 m)     Head Circumference --      Peak Flow --      Pain Score 10/29/18 0610 10     Pain Loc --      Pain Edu? --      Excl. in GC? --     Constitutional: Alert and oriented. Well appearing and in no apparent distress. HEENT:      Head: Normocephalic and atraumatic.         Eyes: Conjunctivae are normal. Sclera is non-icteric.       Mouth/Throat: Mucous  membranes are moist.       Neck: Supple with no signs of meningismus. Cardiovascular: Regular rate and rhythm. No murmurs, gallops, or rubs. 2+ symmetrical distal pulses are present in all extremities. No JVD. Respiratory: Normal respiratory effort. Lungs are clear to auscultation bilaterally. No wheezes, crackles, or rhonchi.  Gastrointestinal: Soft, non tender, and non distended with positive bowel sounds. No rebound or guarding. Genitourinary: No CVA tenderness. Musculoskeletal: No CT  and L-spine tenderness, full painless range of motion of bilateral hips, no crepitus, cellulitis, or any abnormal findings of the leg and buttock region, patient is tender to palpation over the sciatic notch on the left, negative straight leg raise, strong DP and PT pulses, extremities are warm and well-perfused. Neurologic: Normal speech and language. Face is symmetric. Moving all extremities. No gross focal neurologic deficits are appreciated.  Intact strength and sensation x4, 2+ patellar DTR bilaterally Skin: Skin is warm, dry and intact. No rash noted. Psychiatric: Mood and affect are normal. Speech and behavior are normal.  ____________________________________________   LABS (all labs ordered are listed, but only abnormal results are displayed)  Labs Reviewed  BASIC METABOLIC PANEL  CBC  URINALYSIS, COMPLETE (UACMP) WITH MICROSCOPIC   ____________________________________________  EKG  none  ____________________________________________  RADIOLOGY  CT: PND ____________________________________________   PROCEDURES  Procedure(s) performed: None Procedures Critical Care performed:  None ____________________________________________   INITIAL IMPRESSION / ASSESSMENT AND PLAN / ED COURSE  63 y.o. male with a history of alcohol abuse, hepatitis C, hypertension, Raynauds, who presents for evaluation of back pain.  Differential diagnosis including kidney stone versus sciatica pain versus hip  arthritis.  Patient is tender to palpation over the sciatic notch.  Neuro exam is intact with no signs of cauda equina.  Abdomen is soft with no tenderness and no masses.  Low suspicion for dissection with intermittent pain, pain that is worse with movement, strong distal pulses and intact neuro exam.  Will get labs, urinalysis, CT abdomen pelvis to eval for kidney stone.  Will give Tylenol and oxycodone for pain.       As part of my medical decision making, I reviewed the following data within the [facility] notes reviewed and incorporated, Labs reviewed , Old chart reviewed, Notes from prior ED visits and [place] Controlled Substance Database   Patient was evaluated in Emergency Department today for the symptoms described in the history of present illness. Patient was evaluated in the context of the global COVID-19 pandemic, which necessitated consideration that the patient might be at risk for infection with the SARS-CoV-2 virus that causes COVID-19. Institutional protocols and algorithms that pertain to the evaluation of patients at risk for COVID-19 are in a state of rapid change based on information released by regulatory bodies including the CDC and federal and state organizations. These policies and algorithms were followed during the patient's care in the ED.   ____________________________________________   FINAL CLINICAL IMPRESSION(S) / ED DIAGNOSES  Back pain    NEW MEDICATIONS STARTED DURING THIS VISIT:  ED Discharge Orders    None       Note:  This document was prepared using Dragon voice recognition software and may include unintentional dictation errors.    Alfred Levins, [place], MD 10/30/18 [phone]

## 2018-10-29 NOTE — ED Notes (Signed)
Patient transported to CT at this time. 

## 2018-10-29 NOTE — Discharge Instructions (Signed)
Please seek medical attention for any high fevers, chest pain, shortness of breath, change in behavior, persistent vomiting, bloody stool or any other new or concerning symptoms.  

## 2018-10-29 NOTE — ED Provider Notes (Signed)
CT scan without obvious etiology of the patients discomfort. Discussed CT scan with patient. At this point think likely MSK cause of patient's complaints. Discussed this with the patient. Will plan on discharging with pain medications and steroids. Discussed return precautions.   Nance Pear, MD 10/29/18 [phone]

## 2019-06-06 ENCOUNTER — Other Ambulatory Visit: Payer: Self-pay

## 2019-06-06 ENCOUNTER — Emergency Department: Payer: Self-pay

## 2019-06-06 ENCOUNTER — Emergency Department
Admission: EM | Admit: 2019-06-06 | Discharge: 2019-06-06 | Disposition: A | Discharge status: Home / Self Care | Payer: Self-pay | Attending: Emergency Medicine | Admitting: Emergency Medicine

## 2019-06-06 DIAGNOSIS — I10 Essential (primary) hypertension: Secondary | ICD-10-CM | POA: Insufficient documentation

## 2019-06-06 DIAGNOSIS — F1110 Opioid abuse, uncomplicated: Secondary | ICD-10-CM | POA: Diagnosis present

## 2019-06-06 DIAGNOSIS — F17210 Nicotine dependence, cigarettes, uncomplicated: Secondary | ICD-10-CM | POA: Insufficient documentation

## 2019-06-06 DIAGNOSIS — Z86711 Personal history of pulmonary embolism: Secondary | ICD-10-CM | POA: Insufficient documentation

## 2019-06-06 DIAGNOSIS — B1710 Acute hepatitis C without hepatic coma: Secondary | ICD-10-CM | POA: Insufficient documentation

## 2019-06-06 DIAGNOSIS — Z7982 Long term (current) use of aspirin: Secondary | ICD-10-CM | POA: Insufficient documentation

## 2019-06-06 DIAGNOSIS — E039 Hypothyroidism, unspecified: Secondary | ICD-10-CM | POA: Insufficient documentation

## 2019-06-06 DIAGNOSIS — Z79899 Other long term (current) drug therapy: Secondary | ICD-10-CM | POA: Insufficient documentation

## 2019-06-06 DIAGNOSIS — K743 Primary biliary cirrhosis: Secondary | ICD-10-CM

## 2019-06-06 DIAGNOSIS — F1021 Alcohol dependence, in remission: Secondary | ICD-10-CM

## 2019-06-06 DIAGNOSIS — R634 Abnormal weight loss: Secondary | ICD-10-CM | POA: Insufficient documentation

## 2019-06-06 DIAGNOSIS — F1910 Other psychoactive substance abuse, uncomplicated: Secondary | ICD-10-CM | POA: Insufficient documentation

## 2019-06-06 LAB — URINE DRUG SCREEN, QUALITATIVE (ARMC ONLY)
Amphetamines, Ur Screen: NOT DETECTED
Barbiturates, Ur Screen: NOT DETECTED
Benzodiazepine, Ur Scrn: NOT DETECTED
Cannabinoid 50 Ng, Ur ~~LOC~~: POSITIVE — AB
Cocaine Metabolite,Ur ~~LOC~~: NOT DETECTED
MDMA (Ecstasy)Ur Screen: NOT DETECTED
Methadone Scn, Ur: NOT DETECTED
Opiate, Ur Screen: NOT DETECTED
Phencyclidine (PCP) Ur S: NOT DETECTED
Tricyclic, Ur Screen: NOT DETECTED

## 2019-06-06 LAB — COMPREHENSIVE METABOLIC PANEL
ALT: 72 U/L — ABNORMAL HIGH (ref 0–44)
AST: 46 U/L — ABNORMAL HIGH (ref 15–41)
Albumin: 4.1 g/dL (ref 3.5–5.0)
Alkaline Phosphatase: 66 U/L (ref 38–126)
Anion gap: 8 (ref 5–15)
BUN: 13 mg/dL (ref 8–23)
CO2: 27 mmol/L (ref 22–32)
Calcium: 9.3 mg/dL (ref 8.9–10.3)
Chloride: 103 mmol/L (ref 98–111)
Creatinine, Ser: 0.79 mg/dL (ref 0.61–1.24)
GFR calc Af Amer: >60 mL/min (ref >60)
GFR calc non Af Amer: >60 mL/min (ref >60)
Glucose, Bld: 120 mg/dL — ABNORMAL HIGH (ref 70–99)
Potassium: 3.8 mmol/L (ref 3.5–5.1)
Sodium: 138 mmol/L (ref 135–145)
Total Bilirubin: 0.4 mg/dL (ref 0.3–1.2)
Total Protein: 7.7 g/dL (ref 6.5–8.1)

## 2019-06-06 LAB — CBC
HCT: 43.9 % (ref 39.0–52.0)
Hemoglobin: 14.5 g/dL (ref 13.0–17.0)
MCH: 31 pg (ref 26.0–34.0)
MCHC: 33 g/dL (ref 30.0–36.0)
MCV: 94 fL (ref 80.0–100.0)
Platelets: 130 10*3/uL — ABNORMAL LOW (ref 150–400)
RBC: 4.67 MIL/uL (ref 4.22–5.81)
RDW: 12.7 % (ref 11.5–15.5)
WBC: 5.9 10*3/uL (ref 4.0–10.5)
nRBC: 0 % (ref 0.0–0.2)

## 2019-06-06 LAB — ETHANOL: Alcohol, Ethyl (B): <10 mg/dL (ref <10)

## 2019-06-06 MED ORDER — LOSARTAN POTASSIUM 50 MG PO TABS: 25.0000 mg | ORAL_TABLET | Freq: Every day | ORAL | 0 refills | Status: AC

## 2019-06-06 MED ORDER — SILDENAFIL CITRATE 20 MG PO TABS: 20.0000 mg | ORAL_TABLET | Freq: Three times a day (TID) | ORAL | 0 refills | Status: AC

## 2019-06-06 MED ORDER — FLUOXETINE HCL 20 MG PO CAPS
20.0000 mg | ORAL_CAPSULE | Freq: Every day | ORAL | Status: DC
  Administered 2019-06-06: 20 mg via ORAL
  Filled 2019-06-06: qty 1

## 2019-06-06 MED ORDER — OMEPRAZOLE 20 MG PO CPDR: 20.0000 mg | DELAYED_RELEASE_CAPSULE | Freq: Every day | ORAL | 0 refills | Status: AC

## 2019-06-06 MED ORDER — FLUOXETINE HCL 20 MG PO CAPS: 20.0000 mg | ORAL_CAPSULE | Freq: Every day | ORAL | 2 refills | Status: DC

## 2019-06-06 MED ORDER — GABAPENTIN 100 MG PO CAPS: 100.0000 mg | ORAL_CAPSULE | Freq: Three times a day (TID) | ORAL | 0 refills | Status: DC

## 2019-06-06 MED ORDER — ASPIRIN 81 MG PO CHEW: 81.0000 mg | CHEWABLE_TABLET | Freq: Every day | ORAL | 1 refills | Status: AC

## 2019-06-06 MED ORDER — IOHEXOL 300 MG/ML  SOLN
80.0000 mL | Freq: Once | INTRAMUSCULAR | Status: AC | PRN
  Administered 2019-06-06: 75 mL via INTRAVENOUS

## 2019-06-06 MED ORDER — GABAPENTIN 100 MG PO CAPS
100.0000 mg | ORAL_CAPSULE | Freq: Three times a day (TID) | ORAL | Status: DC
  Administered 2019-06-06: 100 mg via ORAL
  Filled 2019-06-06 (×2): qty 1

## 2019-06-06 MED ORDER — LEVOTHYROXINE SODIUM 25 MCG PO TABS: 25.0000 ug | ORAL_TABLET | Freq: Every day | ORAL | 3 refills | Status: AC

## 2019-06-06 MED ORDER — FLUOXETINE HCL 20 MG PO CAPS: 20.0000 mg | ORAL_CAPSULE | Freq: Every day | ORAL | 3 refills | Status: DC

## 2019-06-06 MED ORDER — AMLODIPINE BESYLATE 10 MG PO TABS: 10.0000 mg | ORAL_TABLET | Freq: Every day | ORAL | 2 refills | Status: DC

## 2019-06-06 NOTE — ED Triage Notes (Signed)
Pt states he is here for alcohol withdrawal and suboxone withdrawal. Was not prescribed suboxone. States his wife passed in december and he started drinking because he was depressed. Denies SI. States "I've been in some dark spaces the last few days." last drink 7 days ago, last suboxone 2 days ago. A&O, in wheelchair.

## 2019-06-06 NOTE — ED Triage Notes (Signed)
FIRST NURSE NOTE:  Pt here with daughter, reports he needs to go to detox, states he is coming off of alcohol and suboxone.  Pt states its been about 7 days since etoh use and 2 days since suboxone.

## 2019-06-06 NOTE — ED Notes (Signed)
Pt changed out by this RN and Gerilyn Pilgrim, EDT.   Pt turned cell phone off, wallet, blue jeans, yellow long sleeve shirt, gray long sleeve shirt, black jacket, pair of brown shoes, blue bag full of change of clothes, brownish hat, boxers.

## 2019-06-06 NOTE — Discharge Instructions (Signed)
[facility] (Mental Health & Substance Use Services) & [facility]  Mental health service in [place], [place] Address: [address], [place], [place] [address] Hours:  Closed ? Opens 8AM Mon Phone: [phone] 

## 2019-06-06 NOTE — ED Provider Notes (Signed)
[facility] Emergency Department Provider Note   ____________________________________________    I have reviewed the triage vital signs and the nursing notes.   HISTORY  Chief Complaint     HPI Juan Wood is a 64 y.o. male with significant past medical history as detailed below presents with complaints of depression and Suboxone dependence.  Patient reports his wife died in 03-16-23 and since then he has been abusing alcohol and Suboxone.  He reports he has been able to not have any alcohol in 1 week.  However he has been taking Suboxone, requests assistance with detox.  He also notes severe weight loss over the last 2 months, he reports that he has lost several pounds over the last couple of days.  He reports cancer runs in his family.  He has smoked since he was 64 years old.  Does describe cramping abdominal pain over the last week in his lower abdomen  Past Medical History:  Diagnosis Date  . Alcohol abuse   . Allergy    Hay fever  . Depression   . Drug abuse ([place])   . Hepatitis C   . Hypertension   . Lower back pain   . Pulmonary embolism ([place])   . Thyroid disease     Patient Active Problem List   Diagnosis Date Noted  . Low back pain 01/26/2016  . Raynaud phenomenon 01/26/2016  . Right hand pain 01/26/2016  . Headache 08/11/2015  . Neck pain 08/11/2015  . Acute maxillary sinusitis 08/11/2015  . Essential hypertension 08/11/2015  . LVH (left ventricular hypertrophy) 08/11/2015  . Acute renal insufficiency 08/11/2015  . Dehydration 08/11/2015  . Hypokalemia 08/11/2015  . Elevated transaminase level 08/11/2015  . Thrombocytopenia ([place]) 08/11/2015  . Syncope 08/10/2015  . Hepatitis C 05/25/2015  . Alcohol dependence with alcohol-induced mood disorder ([place])   . Major depressive disorder, recurrent severe without psychotic features ([place]) 05/24/2015  . 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  Tobacco use disorder 05/23/2015  . Hypothyroidism 05/23/2015  . HTN (hypertension) 05/23/2015  . GERD (gastroesophageal reflux disease) 05/23/2015  . Alcohol abuse with intoxication ([place]) 05/23/2015    Past Surgical History:  Procedure Laterality Date  . HERNIA REPAIR    . KNEE SURGERY     right    Prior to Admission medications   Medication Sig Start Date End Date Taking? Authorizing Provider  amLODipine (NORVASC) 10 MG tablet TAKE 1 TABLET BY MOUTH EVERY DAY. 03/14/16   Boyce Medici, FNP  aspirin 81 MG chewable tablet Chew 81 mg by mouth daily.    [provider]  cyclobenzaprine (FLEXERIL) 5 MG tablet Take 1 tablet (5 mg total) by mouth 3 (three) times daily as needed for muscle spasms. 12/13/17   Johnn Hai, PA-C  HYDROcodone-acetaminophen (NORCO/VICODIN) 5-325 MG tablet Take 1 tablet by mouth every 6 (six) hours as needed for moderate pain. 09/06/17   Johnn Hai, PA-C  levothyroxine (SYNTHROID, LEVOTHROID) 25 MCG tablet TAKE ONE TABLET BY MOUTH EVERY DAY 03/14/16   Boyce Medici, FNP  losartan (COZAAR) 50 MG tablet TAKE 1/2 TABLET BY MOUTH EVERY DAY 04/05/16   McGowan, Larene Beach A, PA-C  omeprazole (PRILOSEC) 20 MG capsule TAKE ONE CAPSULE BY MOUTH EVERY DAY 06/12/16   Tawni Millers, MD  predniSONE (DELTASONE) 10 MG tablet Take 6 tablets  today, on day 2 take 5 tablets, day 3 take 4 tablets, day 4 take 3  tablets, day 5 take  2 tablets and 1 tablet the last day 12/13/17   Tommi Rumps, PA-C  predniSONE (STERAPRED UNI-PAK 21 TAB) 10 MG (21) TBPK tablet Per packaging instructions 10/29/18   Phineas Semen, MD  sildenafil (REVATIO) 20 MG tablet Take 1 tablet (20 mg total) by mouth 3 (three) times daily. 12/02/15   Michiel Cowboy A, PA-C  traMADol (ULTRAM) 50 MG tablet Take 1 tablet (50 mg total) by mouth every 6 (six) hours as needed. 12/13/17   Tommi Rumps, PA-C  traMADol (ULTRAM) 50 MG tablet Take 1 tablet (50 mg total) by mouth every 6 (six) hours as needed.  10/29/18 10/29/19  Phineas Semen, MD  traZODone (DESYREL) 50 MG tablet Take 1-2 tablets (50-100 mg total) by mouth at bedtime as needed for sleep. 08/11/15   Katharina Caper, MD     Allergies Patient has no known allergies.  Family History  Problem Relation Age of Onset  . Heart attack Father        x 2  . Cancer Father        Pancreatic  . Heart attack Mother   . Hypertension Mother   . Cancer Mother        Skin    Social History Social History   Tobacco Use  . Smoking status: Current Every Day Smoker    Packs/day: 0.50    Years: 35.00    Pack years: 17.50    Types: Cigarettes  . Smokeless tobacco: Never Used  Substance Use Topics  . Alcohol use: Yes    Alcohol/week: 12.0 standard drinks    Types: 12 Cans of beer per week    Comment: occasional  . Drug use: Yes    Types: Cocaine, Opium, Marijuana    Review of Systems  Constitutional: Weight loss as above Eyes: No visual changes.  ENT: No sore throat. Cardiovascular: Denies chest pain. Respiratory: Denies shortness of breath. Gastrointestinal: Abdominal discomfort Genitourinary: Negative for dysuria. Musculoskeletal: Negative for back pain. Skin: Negative for rash. Neurological: Negative for headaches or weakness   ____________________________________________   PHYSICAL EXAM:  VITAL SIGNS: ED Triage Vitals  Enc Vitals Group     BP 06/06/19 1319 (!) 161/104     Pulse Rate 06/06/19 1319 81     Resp 06/06/19 1319 18     Temp 06/06/19 1319 98.1 F (36.7 C)     Temp Source 06/06/19 1319 Oral     SpO2 06/06/19 1319 100 %     Weight 06/06/19 1320 48.5 kg (107 lb)     Height 06/06/19 1320 1.753 m (5\' 9" )     Head Circumference --      Peak Flow --      Pain Score 06/06/19 1319 10     Pain Loc --      Pain Edu? --      Excl. in GC? --     Constitutional: Alert and oriented.  Cachectic Eyes: Conjunctivae are normal.   Nose: No congestion/rhinnorhea. Mouth/Throat: Mucous membranes are moist.  Normal  thyroid  Cardiovascular: Normal rate, regular rhythm. Grossly normal heart sounds.  Good peripheral circulation. Respiratory: Normal respiratory effort.  No retractions. Lungs CTAB. Gastrointestinal: Mild tenderness in the left lower quadrant right lower quadrant no distention.  No CVA tenderness.  Musculoskeletal:  Warm and well perfused Neurologic:  Normal speech and language. No gross focal neurologic deficits are appreciated.  Skin:  Skin is warm, dry and intact. No rash noted. Psychiatric: Mood and  affect are normal. Speech and behavior are normal.  ____________________________________________   LABS (all labs ordered are listed, but only abnormal results are displayed)  Labs Reviewed  COMPREHENSIVE METABOLIC PANEL - Abnormal; Notable for the following components:      Result Value   Glucose, Bld 120 (*)    AST 46 (*)    ALT 72 (*)    All other components within normal limits  CBC - Abnormal; Notable for the following components:   Platelets 130 (*)    All other components within normal limits  ETHANOL  URINE DRUG SCREEN, QUALITATIVE ([facility] ONLY)   ____________________________________________  EKG  None ____________________________________________  RADIOLOGY  CT chest abdomen pelvis ____________________________________________   PROCEDURES  Procedure(s) performed: No  Procedures   Critical Care performed: No ____________________________________________   INITIAL IMPRESSION / ASSESSMENT AND PLAN / ED COURSE  Pertinent labs & imaging results that were available during my care of the patient were reviewed by me and considered in my medical decision making (see chart for details).  Patient presents for help with opioid dependence.  He does report depression however denies SI or HI.  He seems to have successfully weaned himself from alcohol.  Likely the patient would benefit from psychiatry consultation although he primarily would like assistance with detox  symptoms.  I am concerned about the patient's severe weight loss, he is clearly cachectic given his smoking history strong concern for CA, he does have abdominal pain as described above, will send for CT abdomen pelvis as well as chest  Patient is voluntary    ____________________________________________   FINAL CLINICAL IMPRESSION(S) / ED DIAGNOSES  Final diagnoses:  Substance abuse (HCC)  Abnormal weight loss        Note:  This document was prepared using Dragon voice recognition software and may include unintentional dictation errors.   Jene Every, MD 06/06/19 1431

## 2019-06-06 NOTE — ED Provider Notes (Signed)
Patient has been cleared by psychiatry for discharge  IMPRESSION:  1. No acute findings in the chest, abdomen or pelvis. No findings to  explain the patient's weight loss.  2. Rectosigmoid diverticulosis without evidence of diverticulitis.  3. Enlarged prostate gland.   Aortic Atherosclerosis (ICD10-I70.0) and Emphysema (ICD10-J43.9).     Emily Filbert, MD 06/06/19 [phone]

## 2019-06-07 NOTE — Consult Note (Signed)
[facility] Psych ED Discharge  06/07/2019 11:39 AM Juan Wood  MRN:  [id number] Principal Problem: Alcohol use disorder, severe, in sustained remission  [facility]) Discharge Diagnoses: Principal Problem:   Alcohol use disorder, severe, in sustained remission (HCC) Active Problems:   Opiate abuse, continuous (HCC)  Subjective: "I just wanted to make sure I was ok because I know alcohol withdrawal can kill you."  Patient seen and evaluated in person by this provider for substance abuse and depression.  Client concern that he is withdrawing from alcohol and Suboxone.  His last drink was 7 days ago and realizes that it is dangerous to withdraw from alcohol.  No signs or symptoms of withdrawal symptoms at this time.  He has been using "pieces" of Suboxone strip daily that was given to him by a friend.  His last use was a couple of days ago and is feeling mild flulike symptoms congruent with opiate withdrawal.  He has had some weight loss which is most likely related to his hyperthyroidism and being off medication for the past year.  Encouraged him to see his primary care physician and let the EDP know this information.  His wife became sick last year and his focus moved to her and he stopped taking his medications and following up with appointments.  She passed away in March 01, 2023 and depression started.  He started drinking alcohol and realized this was becoming a problem and stopped a week ago.  During the past few weeks or so he wanted to "feel better" so he took small pieces of Suboxone provided from a friend.  He realized that this was not appropriate and stopped.  Past history of toxic oxycodone abuse per patient.  Some anxiety with withdrawal symptoms.  Denies suicidal ideations or past attempts.  This provider did offer inpatient stabilization but client declined as he used to go to [facility] and was stabilized on Prozac.  Agreeable to follow-up with them on Monday and this provider starting Prozac.  Also provided  gabapentin 100 mg 3 times daily for any potential lingering alcohol withdrawal symptoms even though it has been 7 days.  Client does warrant return home and feels like he has "kicked" the alcohol and will continue to work on the opiate withdrawal.  He has noticed a weight loss and did not realize it was related to his hyperthyroidism and has been drinking Ensure and taking supplements to increase his weight.  He has is good support system in place and agreeable to go to outpatient therapy and medication management at [facility].  No suicidal/homicidal ideations, hallucinations, and mild opiate withdrawal symptoms.  Psychiatrically stable for discharge  HPI per MD:  Juan Wood is a 64 y.o. male with significant past medical history as detailed below presents with complaints of depression and Suboxone dependence.  Patient reports his wife died in Mar 01, 2023 and since then he has been abusing alcohol and Suboxone.  He reports he has been able to not have any alcohol in 1 week.  However he has been taking Suboxone, requests assistance with detox.  He also notes severe weight loss over the last 2 months, he reports that he has lost several pounds over the last couple of days.  He reports cancer runs in his family.  He has smoked since he was 65 years old.  Does describe cramping abdominal pain over the last week in his lower abdomen  Total Time spent with patient: 1 hour  Past Psychiatric History: substance abuse, depression  Past Medical  History:  Past Medical History:  Diagnosis Date  . Alcohol abuse   . Allergy    Hay fever  . Depression   . Drug abuse (HCC)   . Hepatitis C   . Hypertension   . Lower back pain   . Pulmonary embolism (HCC)   . Thyroid disease     Past Surgical History:  Procedure Laterality Date  . HERNIA REPAIR    . KNEE SURGERY     right   Family History:  Family History  Problem Relation Age of Onset  . Heart attack Father        x 2  . Cancer Father        Pancreatic   . Heart attack Mother   . Hypertension Mother   . Cancer Mother        Skin   Family Psychiatric  History: none Social History:  Social History   Substance and Sexual Activity  Alcohol Use Yes  . Alcohol/week: 12.0 standard drinks  . Types: 12 Cans of beer per week   Comment: occasional     Social History   Substance and Sexual Activity  Drug Use Yes  . Types: Cocaine, Opium, Marijuana    Social History   Socioeconomic History  . Marital status: Widowed    Spouse name: Not on file  . Number of children: Not on file  . Years of education: Not on file  . Highest education level: Not on file  Occupational History  . Occupation: filed for disability  Tobacco Use  . Smoking status: Current Every Day Smoker    Packs/day: 0.50    Years: 35.00    Pack years: 17.50    Types: Cigarettes  . Smokeless tobacco: Never Used  Substance and Sexual Activity  . Alcohol use: Yes    Alcohol/week: 12.0 standard drinks    Types: 12 Cans of beer per week    Comment: occasional  . Drug use: Yes    Types: Cocaine, Opium, Marijuana  . Sexual activity: Never  Other Topics Concern  . Not on file  Social History Narrative  . Not on file   Social Determinants of Health   Financial Resource Strain:   . Difficulty of Paying Living Expenses:   Food Insecurity:   . Worried About Programme researcher, broadcasting/film/video in the Last Year:   . Barista in the Last Year:   Transportation Needs:   . Freight forwarder (Medical):   Marland Kitchen Lack of Transportation (Non-Medical):   Physical Activity:   . Days of Exercise per Week:   . Minutes of Exercise per Session:   Stress:   . Feeling of Stress :   Social Connections:   . Frequency of Communication with Friends and Family:   . Frequency of Social Gatherings with Friends and Family:   . Attends Religious Services:   . Active Member of Clubs or Organizations:   . Attends Banker Meetings:   Marland Kitchen Marital Status:     Has this patient used  any form of tobacco in the last 30 days? (Cigarettes, Smokeless Tobacco, Cigars, and/or Pipes) A prescription for an FDA-approved tobacco cessation medication was offered at discharge and the patient refused  Current Medications: No current facility-administered medications for this encounter.   Current Outpatient Medications  Medication Sig Dispense Refill  . amLODipine (NORVASC) 10 MG tablet Take 1 tablet (10 mg total) by mouth daily. 90 tablet 2  . aspirin 81 MG  chewable tablet Chew 1 tablet (81 mg total) by mouth daily. 30 tablet 1  . FLUoxetine (PROZAC) 20 MG capsule Take 1 capsule (20 mg total) by mouth daily. 30 capsule 2  . FLUoxetine (PROZAC) 20 MG capsule Take 1 capsule (20 mg total) by mouth daily. 30 capsule 3  . gabapentin (NEURONTIN) 100 MG capsule Take 1 capsule (100 mg total) by mouth 3 (three) times daily. 90 capsule 0  . levothyroxine (SYNTHROID) 25 MCG tablet Take 1 tablet (25 mcg total) by mouth daily. 90 tablet 3  . losartan (COZAAR) 50 MG tablet Take 0.5 tablets (25 mg total) by mouth daily. 45 tablet 0  . omeprazole (PRILOSEC) 20 MG capsule Take 1 capsule (20 mg total) by mouth daily. 30 capsule 0  . sildenafil (REVATIO) 20 MG tablet Take 1 tablet (20 mg total) by mouth 3 (three) times daily. 10 tablet 0   PTA Medications: (Not in a hospital admission)   Musculoskeletal: Strength & Muscle Tone: within normal limits Gait & Station: normal Patient leans: N/A  Psychiatric Specialty Exam: Physical Exam Vitals and nursing note reviewed.  Constitutional:      Appearance: Normal appearance.  HENT:     Head: Normocephalic.     Nose: Nose normal.  Pulmonary:     Effort: Pulmonary effort is normal.  Musculoskeletal:        General: Normal range of motion.  Neurological:     General: No focal deficit present.     Mental Status: He is alert and oriented to person, place, and time.  Psychiatric:        Attention and Perception: Attention and perception normal.         Mood and Affect: Affect normal. Mood is anxious and depressed.        Speech: Speech normal.        Behavior: Behavior normal. Behavior is cooperative.        Thought Content: Thought content normal.        Cognition and Memory: Cognition and memory normal.        Judgment: Judgment normal.     Review of Systems  Psychiatric/Behavioral: Positive for dysphoric mood. The patient is nervous/anxious.   All other systems reviewed and are negative.   Blood pressure (!) 161/104, pulse 81, temperature 98.1 F (36.7 C), temperature source Oral, resp. rate 18, height 5\' 9"  (1.753 m), weight 48.5 kg, SpO2 100 %.Body mass index is 15.8 kg/m.  General Appearance: Casual  Eye Contact:  Good  Speech:  Normal Rate  Volume:  Normal  Mood:  Anxious, mild  Affect:  Congruent  Thought Process:  Coherent and Descriptions of Associations: Intact  Orientation:  Full (Time, Place, and Person)  Thought Content:  Logical  Suicidal Thoughts:  No  Homicidal Thoughts:  No  Memory:  Immediate;   Good Recent;   Fair Remote;   Good  Judgement:  Fair  Insight:  Fair  Psychomotor Activity:  Normal  Concentration:  Concentration: Good and Attention Span: Good  Recall:  Good  Fund of Knowledge:  Good  Language:  Good  Akathisia:  No  Handed:  Right  AIMS (if indicated):     Assets:  Housing Leisure Time Physical Health Resilience Social Support  ADL's:  Intact  Cognition:  WNL  Sleep:        Demographic Factors:  Male, Caucasian and Living alone  Loss Factors: Loss of significant relationship  Historical Factors: NA  Risk Reduction Factors:  Sense of responsibility to family and Positive social support  Continued Clinical Symptoms:  Anxiety, mild; depression, moderate  Cognitive Features That Contribute To Risk:  None    Suicide Risk:  Minimal: No identifiable suicidal ideation.  Patients presenting with no risk factors but with morbid ruminations; may be classified as minimal  risk based on the severity of the depressive symptoms  Follow-up Information    Go to  [facility]   Why: As soon as possible Contact information: [address] [place] [address] [phone]           Plan Of Care/Follow-up recommendations:  Alcohol use disorder: -Gabapentin 100 mg 3 times daily  Major depressive disorder recurrent moderate: -Start Prozac 20 mg daily -Follow-up with [facility], instructions placed in discharge instructions Activity:  as tolerated Diet:  heart healthy diet  Disposition: discharge home Nanine Means, NP 06/07/2019, 11:39 AM

## 2019-07-14 ENCOUNTER — Telehealth: Payer: Self-pay | Admitting: Pharmacist

## 2019-07-14 NOTE — Telephone Encounter (Signed)
Patient failed to provide requested 2021 financial documentation. No additional medication assistance will be provided by [facility] without the required proof of income documentation. Patient notified by letter Debra Cheek Administrative Assistant [facility] 

## 2019-10-01 ENCOUNTER — Emergency Department
Admission: EM | Admit: 2019-10-01 | Discharge: 2019-10-01 | Disposition: A | Discharge status: Home / Self Care | Payer: Self-pay | Attending: Emergency Medicine | Admitting: Emergency Medicine

## 2019-10-01 ENCOUNTER — Other Ambulatory Visit: Payer: Self-pay

## 2019-10-01 ENCOUNTER — Encounter: Payer: Self-pay | Admitting: Emergency Medicine

## 2019-10-01 DIAGNOSIS — S81012A Laceration without foreign body, left knee, initial encounter: Secondary | ICD-10-CM | POA: Insufficient documentation

## 2019-10-01 DIAGNOSIS — X58XXXA Exposure to other specified factors, initial encounter: Secondary | ICD-10-CM | POA: Insufficient documentation

## 2019-10-01 DIAGNOSIS — Y998 Other external cause status: Secondary | ICD-10-CM | POA: Insufficient documentation

## 2019-10-01 DIAGNOSIS — Z5321 Procedure and treatment not carried out due to patient leaving prior to being seen by health care provider: Secondary | ICD-10-CM | POA: Insufficient documentation

## 2019-10-01 DIAGNOSIS — Y9389 Activity, other specified: Secondary | ICD-10-CM | POA: Insufficient documentation

## 2019-10-01 DIAGNOSIS — Y9289 Other specified places as the place of occurrence of the external cause: Secondary | ICD-10-CM | POA: Insufficient documentation

## 2019-10-01 NOTE — ED Triage Notes (Signed)
Laceration to left knee cap with chainsaw.  Bleeding controlled.  Dressing applied.  NAD

## 2019-10-29 ENCOUNTER — Other Ambulatory Visit: Payer: Self-pay

## 2019-10-29 ENCOUNTER — Other Ambulatory Visit: Payer: Self-pay | Admitting: Emergency Medicine

## 2019-10-29 ENCOUNTER — Encounter: Payer: Self-pay | Admitting: Emergency Medicine

## 2019-10-29 ENCOUNTER — Emergency Department
Admission: EM | Admit: 2019-10-29 | Discharge: 2019-10-29 | Disposition: A | Discharge status: Home / Self Care | Payer: Self-pay | Attending: Emergency Medicine | Admitting: Emergency Medicine

## 2019-10-29 ENCOUNTER — Emergency Department: Payer: Self-pay

## 2019-10-29 DIAGNOSIS — Z7982 Long term (current) use of aspirin: Secondary | ICD-10-CM | POA: Insufficient documentation

## 2019-10-29 DIAGNOSIS — Z20822 Contact with and (suspected) exposure to covid-19: Secondary | ICD-10-CM | POA: Insufficient documentation

## 2019-10-29 DIAGNOSIS — H9203 Otalgia, bilateral: Secondary | ICD-10-CM | POA: Insufficient documentation

## 2019-10-29 DIAGNOSIS — B349 Viral infection, unspecified: Secondary | ICD-10-CM | POA: Insufficient documentation

## 2019-10-29 DIAGNOSIS — Z79899 Other long term (current) drug therapy: Secondary | ICD-10-CM | POA: Insufficient documentation

## 2019-10-29 DIAGNOSIS — E039 Hypothyroidism, unspecified: Secondary | ICD-10-CM | POA: Insufficient documentation

## 2019-10-29 DIAGNOSIS — I10 Essential (primary) hypertension: Secondary | ICD-10-CM | POA: Insufficient documentation

## 2019-10-29 DIAGNOSIS — R0602 Shortness of breath: Secondary | ICD-10-CM | POA: Insufficient documentation

## 2019-10-29 DIAGNOSIS — H6123 Impacted cerumen, bilateral: Secondary | ICD-10-CM | POA: Insufficient documentation

## 2019-10-29 DIAGNOSIS — F17210 Nicotine dependence, cigarettes, uncomplicated: Secondary | ICD-10-CM | POA: Insufficient documentation

## 2019-10-29 DIAGNOSIS — Z7951 Long term (current) use of inhaled steroids: Secondary | ICD-10-CM | POA: Insufficient documentation

## 2019-10-29 DIAGNOSIS — R519 Headache, unspecified: Secondary | ICD-10-CM | POA: Insufficient documentation

## 2019-10-29 LAB — CBC WITH DIFFERENTIAL/PLATELET
Abs Immature Granulocytes: 0.02 10*3/uL (ref 0.00–0.07)
Basophils Absolute: 0 10*3/uL (ref 0.0–0.1)
Basophils Relative: 0 %
Eosinophils Absolute: 0.1 10*3/uL (ref 0.0–0.5)
Eosinophils Relative: 2 %
HCT: 44.2 % (ref 39.0–52.0)
Hemoglobin: 14.9 g/dL (ref 13.0–17.0)
Immature Granulocytes: 0 %
Lymphocytes Relative: 34 %
Lymphs Abs: 1.6 10*3/uL (ref 0.7–4.0)
MCH: 30.5 pg (ref 26.0–34.0)
MCHC: 33.7 g/dL (ref 30.0–36.0)
MCV: 90.6 fL (ref 80.0–100.0)
Monocytes Absolute: 0.7 10*3/uL (ref 0.1–1.0)
Monocytes Relative: 14 %
Neutro Abs: 2.4 10*3/uL (ref 1.7–7.7)
Neutrophils Relative %: 50 %
Platelets: 114 10*3/uL — ABNORMAL LOW (ref 150–400)
RBC: 4.88 MIL/uL (ref 4.22–5.81)
RDW: 12.8 % (ref 11.5–15.5)
Smear Review: DECREASED
WBC: 4.7 10*3/uL (ref 4.0–10.5)
nRBC: 0 % (ref 0.0–0.2)

## 2019-10-29 LAB — COMPREHENSIVE METABOLIC PANEL
ALT: 38 U/L (ref 0–44)
AST: 41 U/L (ref 15–41)
Albumin: 4.2 g/dL (ref 3.5–5.0)
Alkaline Phosphatase: 53 U/L (ref 38–126)
Anion gap: 10 (ref 5–15)
BUN: 12 mg/dL (ref 8–23)
CO2: 25 mmol/L (ref 22–32)
Calcium: 9.2 mg/dL (ref 8.9–10.3)
Chloride: 103 mmol/L (ref 98–111)
Creatinine, Ser: 0.75 mg/dL (ref 0.61–1.24)
GFR calc Af Amer: >60 mL/min (ref >60)
GFR calc non Af Amer: >60 mL/min (ref >60)
Glucose, Bld: 86 mg/dL (ref 70–99)
Potassium: 4.6 mmol/L (ref 3.5–5.1)
Sodium: 138 mmol/L (ref 135–145)
Total Bilirubin: 0.6 mg/dL (ref 0.3–1.2)
Total Protein: 7.8 g/dL (ref 6.5–8.1)

## 2019-10-29 LAB — SARS CORONAVIRUS 2 BY RT PCR (HOSPITAL ORDER, PERFORMED IN ~~LOC~~ HOSPITAL LAB): SARS Coronavirus 2: NEGATIVE

## 2019-10-29 MED ORDER — ALBUTEROL SULFATE HFA 108 (90 BASE) MCG/ACT IN AERS: 2.0000 | INHALATION_SPRAY | Freq: Four times a day (QID) | RESPIRATORY_TRACT | 2 refills | Status: DC | PRN

## 2019-10-29 MED ORDER — CARBAMIDE PEROXIDE 6.5 % OT SOLN
5.0000 [drp] | Freq: Once | OTIC | Status: AC
  Administered 2019-10-29: 5 [drp] via OTIC
  Filled 2019-10-29: qty 15

## 2019-10-29 NOTE — ED Notes (Signed)
See triage note  Presents with slight headache,some discomfort in neck and left ear discomfort  No fever

## 2019-10-29 NOTE — Discharge Instructions (Addendum)
Follow-up with 1 the clinics listed on your discharge papers.  The [place] [facility] on [address] is listed first as this might be the closest one to you.  Increase fluids.  Tylenol or ibuprofen as needed for body aches.  Chest x-ray did not show any pneumonia or suspicions of lung cancer.  Covid test was negative.  Try to decrease the amount of smoking that you are doing each day.  A inhaler was sent to the pharmacy to see if this helps with your breathing at night.

## 2019-10-29 NOTE — ED Provider Notes (Signed)
[facility] Emergency Department Provider Note  ____________________________________________   First MD Initiated Contact with Patient 10/29/19 [phone]     (approximate)  I have reviewed the triage vital signs and the nursing notes.   HISTORY  Chief Complaint Nasal Congestion, Ear Pain, and Headache    HPI Juan Wood is a 64 y.o. male presents to the ED with complaint of bilateral ear pain with the left being worse than the right.  Slight headache with some dizziness, cough with mild shortness of breath, mild orthopnea using 1-2 pillows at night and concerns of heat exposure last week.  He denies any known exposure to Covid but currently is not vaccinated.  He also continues to smoke 1 pack cigarettes per day for approximately 40 years.  Patient does not have a PCP and is not routinely followed up by anyone.  He rates pain as an 8 out of 10.       Past Medical History:  Diagnosis Date  . Alcohol abuse   . Allergy    Hay fever  . Depression   . Drug abuse (HCC)   . Hepatitis C   . Hypertension   . Lower back pain   . Pulmonary embolism (HCC)   . Thyroid disease     Patient Active Problem List   Diagnosis Date Noted  . Alcohol use disorder, severe, in sustained remission (HCC) 06/06/2019  . Opiate abuse, continuous (HCC) 06/06/2019  . Low back pain 01/26/2016  . Raynaud phenomenon 01/26/2016  . Right hand pain 01/26/2016  . Headache 08/11/2015  . Neck pain 08/11/2015  . Acute maxillary sinusitis 08/11/2015  . Essential hypertension 08/11/2015  . LVH (left ventricular hypertrophy) 08/11/2015  . Acute renal insufficiency 08/11/2015  . Dehydration 08/11/2015  . Hypokalemia 08/11/2015  . Elevated transaminase level 08/11/2015  . Thrombocytopenia (HCC) 08/11/2015  . Syncope 08/10/2015  . Hepatitis C 05/25/2015  . Alcohol dependence with alcohol-induced mood disorder (HCC)   . Major depressive disorder, recurrent severe without psychotic  features (HCC) 05/24/2015  . 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 Tobacco use disorder 05/23/2015  . Hypothyroidism 05/23/2015  . HTN (hypertension) 05/23/2015  . GERD (gastroesophageal reflux disease) 05/23/2015  . Alcohol abuse with intoxication (HCC) 05/23/2015    Past Surgical History:  Procedure Laterality Date  . HERNIA REPAIR    . KNEE SURGERY     right    Prior to Admission medications   Medication Sig Start Date End Date Taking? Authorizing Provider  albuterol (VENTOLIN HFA) 108 (90 Base) MCG/ACT inhaler Inhale 2 puffs into the lungs every 6 (six) hours as needed for wheezing or shortness of breath. 10/29/19   Tommi Rumps, PA-C  amLODipine (NORVASC) 10 MG tablet Take 1 tablet (10 mg total) by mouth daily. 06/06/19   Emily Filbert, MD  aspirin 81 MG chewable tablet Chew 1 tablet (81 mg total) by mouth daily. 06/06/19   Emily Filbert, MD  FLUoxetine (PROZAC) 20 MG capsule Take 1 capsule (20 mg total) by mouth daily. 06/06/19 06/05/20  Emily Filbert, MD  FLUoxetine (PROZAC) 20 MG capsule Take 1 capsule (20 mg total) by mouth daily. 06/06/19   Charm Rings, NP  gabapentin (NEURONTIN) 100 MG capsule Take 1 capsule (100 mg total) by mouth 3 (three) times daily. 06/06/19   Charm Rings, NP  levothyroxine (SYNTHROID) 25 MCG tablet Take 1 tablet (25 mcg total) by mouth daily. 06/06/19  Emily Filbert, MD  losartan (COZAAR) 50 MG tablet Take 0.5 tablets (25 mg total) by mouth daily. 06/06/19   Emily Filbert, MD  omeprazole (PRILOSEC) 20 MG capsule Take 1 capsule (20 mg total) by mouth daily. 06/06/19   Emily Filbert, MD  sildenafil (REVATIO) 20 MG tablet Take 1 tablet (20 mg total) by mouth 3 (three) times daily. 06/06/19   Emily Filbert, MD    Allergies Patient has no known allergies.  Family History  Problem Relation Age of Onset  . Heart attack Father        x 2  . Cancer Father         Pancreatic  . Heart attack Mother   . Hypertension Mother   . Cancer Mother        Skin    Social History Social History   Tobacco Use  . Smoking status: Current Every Day Smoker    Packs/day: 0.50    Years: 35.00    Pack years: 17.50    Types: Cigarettes  . Smokeless tobacco: Never Used  Substance Use Topics  . Alcohol use: Yes    Alcohol/week: 12.0 standard drinks    Types: 12 Cans of beer per week    Comment: occasional  . Drug use: Yes    Types: Cocaine, Opium, Marijuana    Review of Systems Constitutional: No fever/chills Eyes: No visual changes. ENT: No sore throat.  Bilateral ear pain. Cardiovascular: Denies chest pain. Respiratory: Denies shortness of breath. Gastrointestinal: No abdominal pain.  No nausea, no vomiting.  No diarrhea.   Genitourinary: Negative for dysuria. Musculoskeletal: Negative for back pain. Skin: Negative for rash. Neurological: Positive for mild headaches, no focal weakness or numbness. ____________________________________________   PHYSICAL EXAM:  VITAL SIGNS: ED Triage Vitals  Enc Vitals Group     BP 10/29/19 0717 (!) 182/91     Pulse Rate 10/29/19 0717 65     Resp 10/29/19 0717 18     Temp 10/29/19 0717 98.8 F (37.1 C)     Temp Source 10/29/19 0717 Oral     SpO2 10/29/19 0717 100 %     Weight 10/29/19 0718 110 lb (49.9 kg)     Height 10/29/19 0718 5\' 9"  (1.753 m)     Head Circumference --      Peak Flow --      Pain Score 10/29/19 0716 8     Pain Loc --      Pain Edu? --      Excl. in GC? --     Constitutional: Alert and oriented. Well appearing and in no acute distress. Eyes: Conjunctivae are normal.  Head: Atraumatic. Nose: No congestion/rhinnorhea.  EACs are obstructed with cerumen.  TMs are not visible. Mouth/Throat: Mucous membranes are moist.  Oropharynx non-erythematous. Neck: No stridor.   Cardiovascular: Normal rate, regular rhythm. Grossly normal heart sounds.  Good peripheral circulation. Respiratory:  Normal respiratory effort.  No retractions. Lungs coarse cough is noted but no wheezing and no difficulty with breathing. Gastrointestinal: Soft and nontender. No distention.  Bowel sounds normoactive x4 quadrants. Musculoskeletal: No lower extremity tenderness nor edema.  No joint effusions. Neurologic:  Normal speech and language. No gross focal neurologic deficits are appreciated.  Skin:  Skin is warm, dry and intact. Psychiatric: Mood and affect are normal. Speech and behavior are normal.  ____________________________________________   LABS (all labs ordered are listed, but only abnormal results are displayed)  Labs Reviewed  CBC WITH DIFFERENTIAL/PLATELET -  Abnormal; Notable for the following components:      Result Value   Platelets 114 (*)    All other components within normal limits  SARS CORONAVIRUS 2 BY RT PCR (HOSPITAL ORDER, PERFORMED IN [facility])  COMPREHENSIVE METABOLIC PANEL    RADIOLOGY  Official radiology report(s): DG Chest 2 View  Result Date: 10/29/2019 CLINICAL DATA:  Shortness of breath. EXAM: CHEST - 2 VIEW COMPARISON:  Chest x-ray 08/09/2015. FINDINGS: Mediastinum and hilar structures normal. Heart size normal. Lungs are clear. No pleural effusion or pneumothorax. No acute bony abnormality. IMPRESSION: No acute cardiopulmonary disease. Electronically Signed   By: Maisie Fus  Register   On: 10/29/2019 09:33    ____________________________________________   PROCEDURES  Procedure(s) performed (including Critical Care):  Procedures   ____________________________________________   INITIAL IMPRESSION / ASSESSMENT AND PLAN / ED COURSE  As part of my medical decision making, I reviewed the following data within the electronic MEDICAL RECORD NUMBER Notes from prior ED visits and [place] Controlled Substance Database  Juan Wood was evaluated in Emergency Department on 10/29/2019 for the symptoms described in the history of present illness. He was  evaluated in the context of the global COVID-19 pandemic, which necessitated consideration that the patient might be at risk for infection with the SARS-CoV-2 virus that causes COVID-19. Institutional protocols and algorithms that pertain to the evaluation of patients at risk for COVID-19 are in a state of rapid change based on information released by regulatory bodies including the CDC and federal and state organizations. These policies and algorithms were followed during the patient's care in the ED.  64 year old male presents to the ED with complaint of slight headache and discomfort in his neck along with bilateral ear pain with left being worse than the right.  Patient also complains of some shortness of breath but denies any other Covid symptoms.  Patient is a smoker at 1 pack a day for approximately 40 years.  Patient reports that he has been using 1-2 pillows at night.  X-ray was unremarkable and Covid test was negative.  The remainder of his lab work was reassuring.  Patient was made aware of his results.  The Debrox was placed in both ears and patient is aware that the wax is what is causing his decreased hearing.  Patient was given a list of clinics in the area to call and make an appointment so that he may have a primary care provider.  In talking to him it sounds like possibly [facility] is going to be closer to him and he will call them first.  ____________________________________________   FINAL CLINICAL IMPRESSION(S) / ED DIAGNOSES  Final diagnoses:  Viral illness  Bilateral impacted cerumen     ED Discharge Orders         Ordered    albuterol (VENTOLIN HFA) 108 (90 Base) MCG/ACT inhaler  Every 6 hours PRN        10/29/19 1106           Note:  This document was prepared using Dragon voice recognition software and may include unintentional dictation errors.    Tommi Rumps, PA-C 10/29/19 1336    Delton Prairie, MD 10/29/19 [phone]

## 2019-10-29 NOTE — ED Triage Notes (Signed)
Presents with left ear discomfort and decreased hearing  Slight headache with some dizziness

## 2019-10-29 NOTE — ED Notes (Signed)
Pt discharged home after verbalizing understanding of discharge instructions; nad noted. 

## 2019-12-25 ENCOUNTER — Telehealth: Payer: Self-pay | Admitting: Pharmacy Technician

## 2019-12-25 NOTE — Telephone Encounter (Signed)
Attempted to call patient to make an eligibility appointment.  Unable to reach.  Left message.  Sherilyn Dacosta Care Manager [facility]

## 2020-01-08 ENCOUNTER — Other Ambulatory Visit: Payer: Self-pay

## 2020-01-08 ENCOUNTER — Ambulatory Visit: Payer: Self-pay | Admitting: Pharmacy Technician

## 2020-01-08 DIAGNOSIS — Z79899 Other long term (current) drug therapy: Secondary | ICD-10-CM

## 2020-01-08 NOTE — Progress Notes (Signed)
Completed [facility] application and contract.  Patient agreed to all terms of the [facility] contract.    Patient approved to receive medication assistance at [facility] until Jul 10, 2020.  Patient will be eligible to sign-up for a Medicare Part D plan at that time.  Discussed Medicare Savings Plan and Extra Help with patient.  Provided patient with contact information for Department of Social Services and Optometrist.    Provided patient with Community education officer based on his particular needs.    Referred patient to [facility] and [facility] [facility].  Sherilyn Dacosta Care Manager [facility]

## 2020-01-12 ENCOUNTER — Other Ambulatory Visit: Payer: Self-pay

## 2020-07-22 ENCOUNTER — Emergency Department
Admission: EM | Admit: 2020-07-22 | Discharge: 2020-07-23 | Disposition: A | Discharge status: Other Acute Inpatient Hospital | Payer: Self-pay | Attending: Emergency Medicine | Admitting: Emergency Medicine

## 2020-07-22 ENCOUNTER — Other Ambulatory Visit: Payer: Self-pay

## 2020-07-22 ENCOUNTER — Encounter: Payer: Self-pay | Admitting: Intensive Care

## 2020-07-22 ENCOUNTER — Emergency Department: Payer: Self-pay

## 2020-07-22 DIAGNOSIS — Z7982 Long term (current) use of aspirin: Secondary | ICD-10-CM | POA: Insufficient documentation

## 2020-07-22 DIAGNOSIS — R0789 Other chest pain: Secondary | ICD-10-CM | POA: Insufficient documentation

## 2020-07-22 DIAGNOSIS — F32A Depression, unspecified: Secondary | ICD-10-CM

## 2020-07-22 DIAGNOSIS — E039 Hypothyroidism, unspecified: Secondary | ICD-10-CM | POA: Insufficient documentation

## 2020-07-22 DIAGNOSIS — F1110 Opioid abuse, uncomplicated: Secondary | ICD-10-CM | POA: Insufficient documentation

## 2020-07-22 DIAGNOSIS — Z20822 Contact with and (suspected) exposure to covid-19: Secondary | ICD-10-CM | POA: Insufficient documentation

## 2020-07-22 DIAGNOSIS — F332 Major depressive disorder, recurrent severe without psychotic features: Secondary | ICD-10-CM | POA: Insufficient documentation

## 2020-07-22 DIAGNOSIS — Z79899 Other long term (current) drug therapy: Secondary | ICD-10-CM | POA: Insufficient documentation

## 2020-07-22 DIAGNOSIS — R4584 Anhedonia: Secondary | ICD-10-CM | POA: Insufficient documentation

## 2020-07-22 DIAGNOSIS — F17210 Nicotine dependence, cigarettes, uncomplicated: Secondary | ICD-10-CM | POA: Insufficient documentation

## 2020-07-22 DIAGNOSIS — I10 Essential (primary) hypertension: Secondary | ICD-10-CM | POA: Insufficient documentation

## 2020-07-22 DIAGNOSIS — J45909 Unspecified asthma, uncomplicated: Secondary | ICD-10-CM | POA: Insufficient documentation

## 2020-07-22 HISTORY — DX: Primary biliary cirrhosis: K74.3

## 2020-07-22 HISTORY — DX: Localized scleroderma (morphea): L94.0

## 2020-07-22 LAB — COMPREHENSIVE METABOLIC PANEL
ALT: 41 U/L (ref 0–44)
AST: 33 U/L (ref 15–41)
Albumin: 3.6 g/dL (ref 3.5–5.0)
Alkaline Phosphatase: 55 U/L (ref 38–126)
Anion gap: 8 (ref 5–15)
BUN: 11 mg/dL (ref 8–23)
CO2: 26 mmol/L (ref 22–32)
Calcium: 8.8 mg/dL — ABNORMAL LOW (ref 8.9–10.3)
Chloride: 103 mmol/L (ref 98–111)
Creatinine, Ser: 0.6 mg/dL — ABNORMAL LOW (ref 0.61–1.24)
GFR, Estimated: >60 mL/min (ref >60)
Glucose, Bld: 97 mg/dL (ref 70–99)
Potassium: 3.7 mmol/L (ref 3.5–5.1)
Sodium: 137 mmol/L (ref 135–145)
Total Bilirubin: 0.5 mg/dL (ref 0.3–1.2)
Total Protein: 6.6 g/dL (ref 6.5–8.1)

## 2020-07-22 LAB — URINE DRUG SCREEN, QUALITATIVE (ARMC ONLY)
Amphetamines, Ur Screen: NOT DETECTED
Barbiturates, Ur Screen: NOT DETECTED
Benzodiazepine, Ur Scrn: NOT DETECTED
Cannabinoid 50 Ng, Ur ~~LOC~~: POSITIVE — AB
Cocaine Metabolite,Ur ~~LOC~~: NOT DETECTED
MDMA (Ecstasy)Ur Screen: NOT DETECTED
Methadone Scn, Ur: NOT DETECTED
Opiate, Ur Screen: NOT DETECTED
Phencyclidine (PCP) Ur S: NOT DETECTED
Tricyclic, Ur Screen: NOT DETECTED

## 2020-07-22 LAB — SALICYLATE LEVEL: Salicylate Lvl: <7 mg/dL — ABNORMAL LOW (ref 7.0–30.0)

## 2020-07-22 LAB — CBC
HCT: 40.2 % (ref 39.0–52.0)
Hemoglobin: 13.2 g/dL (ref 13.0–17.0)
MCH: 30.5 pg (ref 26.0–34.0)
MCHC: 32.8 g/dL (ref 30.0–36.0)
MCV: 92.8 fL (ref 80.0–100.0)
Platelets: 93 10*3/uL — ABNORMAL LOW (ref 150–400)
RBC: 4.33 MIL/uL (ref 4.22–5.81)
RDW: 13.1 % (ref 11.5–15.5)
WBC: 5 10*3/uL (ref 4.0–10.5)
nRBC: 0 % (ref 0.0–0.2)

## 2020-07-22 LAB — ACETAMINOPHEN LEVEL: Acetaminophen (Tylenol), Serum: <10 ug/mL — ABNORMAL LOW (ref 10–30)

## 2020-07-22 LAB — ETHANOL: Alcohol, Ethyl (B): <10 mg/dL (ref <10)

## 2020-07-22 LAB — TROPONIN I (HIGH SENSITIVITY): Troponin I (High Sensitivity): 3 ng/L (ref <18)

## 2020-07-22 LAB — RESP PANEL BY RT-PCR (FLU A&B, COVID) ARPGX2
Influenza A by PCR: NEGATIVE
Influenza B by PCR: NEGATIVE
SARS Coronavirus 2 by RT PCR: NEGATIVE

## 2020-07-22 MED ORDER — ALBUTEROL SULFATE HFA 108 (90 BASE) MCG/ACT IN AERS
2.0000 | INHALATION_SPRAY | Freq: Once | RESPIRATORY_TRACT | Status: DC
  Filled 2020-07-22: qty 6.7

## 2020-07-22 MED ORDER — HYDROXYZINE HCL 25 MG PO TABS: 25.0000 mg | ORAL_TABLET | Freq: Four times a day (QID) | ORAL | Status: DC | PRN

## 2020-07-22 MED ORDER — ONDANSETRON 4 MG PO TBDP
4.0000 mg | ORAL_TABLET | Freq: Four times a day (QID) | ORAL | Status: DC | PRN
  Administered 2020-07-23: 4 mg via ORAL
  Filled 2020-07-22: qty 1

## 2020-07-22 MED ORDER — CLONIDINE HCL 0.1 MG PO TABS: 0.1000 mg | ORAL_TABLET | Freq: Every day | ORAL | Status: DC

## 2020-07-22 MED ORDER — CLONIDINE HCL 0.1 MG PO TABS
0.1000 mg | ORAL_TABLET | Freq: Four times a day (QID) | ORAL | Status: DC
  Administered 2020-07-22 – 2020-07-23 (×2): 0.1 mg via ORAL
  Filled 2020-07-22 (×2): qty 1

## 2020-07-22 MED ORDER — NAPROXEN 500 MG PO TABS
500.0000 mg | ORAL_TABLET | Freq: Two times a day (BID) | ORAL | Status: DC | PRN
  Administered 2020-07-23: 500 mg via ORAL
  Filled 2020-07-22: qty 1

## 2020-07-22 MED ORDER — LOPERAMIDE HCL 2 MG PO CAPS: 2.0000 mg | ORAL_CAPSULE | ORAL | Status: DC | PRN

## 2020-07-22 MED ORDER — DICYCLOMINE HCL 20 MG PO TABS: 20.0000 mg | ORAL_TABLET | Freq: Four times a day (QID) | ORAL | Status: DC | PRN

## 2020-07-22 MED ORDER — MIRTAZAPINE 15 MG PO TABS
7.5000 mg | ORAL_TABLET | Freq: Every day | ORAL | Status: DC
  Administered 2020-07-22: 7.5 mg via ORAL
  Filled 2020-07-22: qty 1

## 2020-07-22 MED ORDER — CLONIDINE HCL 0.1 MG PO TABS: 0.1000 mg | ORAL_TABLET | ORAL | Status: DC

## 2020-07-22 MED ORDER — CITALOPRAM HYDROBROMIDE 20 MG PO TABS
10.0000 mg | ORAL_TABLET | Freq: Every day | ORAL | Status: DC
  Administered 2020-07-22 – 2020-07-23 (×2): 10 mg via ORAL
  Filled 2020-07-22 (×2): qty 1

## 2020-07-22 MED ORDER — METHOCARBAMOL 500 MG PO TABS
500.0000 mg | ORAL_TABLET | Freq: Three times a day (TID) | ORAL | Status: DC | PRN
  Filled 2020-07-22: qty 1

## 2020-07-22 MED ORDER — LORAZEPAM 1 MG PO TABS
1.0000 mg | ORAL_TABLET | Freq: Once | ORAL | Status: AC
  Administered 2020-07-22: 1 mg via ORAL
  Filled 2020-07-22: qty 1

## 2020-07-22 NOTE — ED Notes (Signed)
Patient requesting "something for anxiety". EDP informed, new orders placed.

## 2020-07-22 NOTE — ED Notes (Signed)
VOL, pend consult 

## 2020-07-22 NOTE — BH Assessment (Signed)
PATIENT BED AVAILABLE AFTER 8AM for 07/23/20  Patient has been accepted to [facility].  Patient assigned to [facility] Accepting physician is [employer]  Call report to [phone].  Representative was [place].   ER Staff is aware of it:  [facility] ER Secretary  Dr. Delton Prairie, ER MD  [facility] Patient's Nurse     Address: [address] [place] [address]

## 2020-07-22 NOTE — ED Notes (Signed)
EDP at bedside  

## 2020-07-22 NOTE — ED Triage Notes (Addendum)
Patient reports feeling very depressed and feels like "the world is going to end." Reports also taking suboxone not prescribed. Denies SI/HI. Quit taking medicine over a year ago

## 2020-07-22 NOTE — ED Notes (Signed)
Pt back in hallway 19

## 2020-07-22 NOTE — BH Assessment (Addendum)
Referral information for Psychiatric Hospitalization faxed to;   [facility] [phone]),   . [facility] [phone]),   . [facility] [phone] -or- [phone]),   . [facility] [phone]).  [facility] [phone]), Referral re-faxed at 8:53pm on 07/22/20

## 2020-07-22 NOTE — ED Notes (Addendum)
To imaging via wheel chair.

## 2020-07-22 NOTE — ED Notes (Addendum)
Pt changed into behavioral health scrubs and taken to quad. Belongings bag x2: sneakers, socks, underwear, tshirts, buttonup shirt, khaki, jeans, black sandels, brown belt, wallet ($5x1, $1x7, id card, ebt card, insurance card), baseball cap, charger, black cellphone w/o cracks, toothpaste, cigarettes, yellow elon bag

## 2020-07-22 NOTE — ED Notes (Signed)
Pt requesting something to eat; [place] sandwich and ice water give at this time.

## 2020-07-22 NOTE — ED Notes (Signed)
Pt now endorsing chest discomfort to mid to left chest ongoing since this AM. "knot" feeling in chest. Pt told EDP but did not tell RN. Pt also had tenderness lower abdomen on palpation by EDP.

## 2020-07-22 NOTE — ED Notes (Addendum)
Uses subaxone daily for past year. Marijuana use. Denies alcohol or other drugs. Wife died in last year, depressed. Denies SI/HI. Seeking inpatient detox treatment. Daughter and granddaughter live with patient, unaware of subaxone use. Last took subaxone yesterday

## 2020-07-22 NOTE — Consult Note (Signed)
[facility]   Reason for Consult:  Depression  Referring Physician:  EDP Patient Identification: Juan SpryDonald F Schwegel MRN:  [id number] Principal Diagnosis: Major depressive disorder, recurrent severe without psychotic features (HCC) Diagnosis:  Principal Problem:   Major depressive disorder, recurrent severe without psychotic features (HCC) Active Problems:   Opiate abuse, continuous (HCC)   Total Time spent with patient: 45 minutes  Subjective:   Juan Wood is a 65 y.o. male patient admitted with depression and opiate dependence.  HPI:  65 yo male with depression, anxiety, and Suboxone abuse.  His depression started in December 2020 when his wife died from CHF, 2 months later a close friend died.  He started self-medicating with unprescribed Suboxone off the street which initially helped until his brother-in-law committed suicide recently.  His depression increased significantly after this in the past two months, no suicidal ideations, difficulty concentrating, poor motivation.  He endorses feelings of worthlessness and guilt.  High anxiety at times, no panic attacks.  He did fall on ice 4-5 months ago and experienced a concussion.  His sleep fluctuates from poor to fair.  His appetite is hearty but 35 pound weight loss in the past six months, unintentional, very thin.  Past history of alcohol and cocaine abuse, stopped when his wife died.  Currently complaining of stomach upset, will start clonidine opiate withdrawal protocol.  He is interested in rehab to stop using the Suboxone.  No hallucinations, paranoia, mania, or homicidal ideations.  His daughter and granddaughter moved in with him, supportive but unaware of his Suboxone habit.  Past Psychiatric History: depression, anxiety, polysubstance use d/o  Risk to Self:  none Risk to Others:  none Prior Inpatient Therapy:  a few times and rehabs Prior Outpatient Therapy:  none currently  Past Medical History:  Past  Medical History:  Diagnosis Date  . Alcohol abuse   . Allergy    Hay fever  . Depression   . Drug abuse (HCC)   . Hepatitis C   . Hypertension   . Lower back pain   . Pulmonary embolism (HCC)   . Reynolds syndrome (HCC)   . Thyroid disease     Past Surgical History:  Procedure Laterality Date  . HERNIA REPAIR    . KNEE SURGERY     right   Family History:  Family History  Problem Relation Age of Onset  . Heart attack Father        x 2  . Cancer Father        Pancreatic  . Heart attack Mother   . Hypertension Mother   . Cancer Mother        Skin   Family Psychiatric  History: none Social History:  Social History   Substance and Sexual Activity  Alcohol Use Yes   Comment: occasional     Social History   Substance and Sexual Activity  Drug Use Yes  . Types: Opium, Marijuana   Comment: cocaine and crack in past    Social History   Socioeconomic History  . Marital status: Widowed    Spouse name: Not on file  . Number of children: Not on file  . Years of education: Not on file  . Highest education level: Not on file  Occupational History  . Occupation: filed for disability  Tobacco Use  . Smoking status: Current Every Day Smoker    Packs/day: 0.50    Years: 35.00    Pack years: 17.50  Types: Cigarettes  . Smokeless tobacco: Never Used  Substance and Sexual Activity  . Alcohol use: Yes    Comment: occasional  . Drug use: Yes    Types: Opium, Marijuana    Comment: cocaine and crack in past  . Sexual activity: Never  Other Topics Concern  . Not on file  Social History Narrative  . Not on file   Social Determinants of Health   Financial Resource Strain: Not on file  Food Insecurity: Not on file  Transportation Needs: Not on file  Physical Activity: Not on file  Stress: Not on file  Social Connections: Not on file   Additional Social History:    Allergies:  No Known Allergies  Labs:  Results for orders placed or performed during the  hospital encounter of 07/22/20 (from the past 48 hour(s))  Comprehensive metabolic panel     Status: Abnormal   Collection Time: 07/22/20  3:30 PM  Result Value Ref Range   Sodium 137 135 - 145 mmol/L   Potassium 3.7 3.5 - 5.1 mmol/L   Chloride 103 98 - 111 mmol/L   CO2 26 22 - 32 mmol/L   Glucose, Bld 97 70 - 99 mg/dL    Comment: Glucose reference range applies only to samples taken after fasting for at least 8 hours.   BUN 11 8 - 23 mg/dL   Creatinine, Ser 6.60 (L) 0.61 - 1.24 mg/dL   Calcium 8.8 (L) 8.9 - 10.3 mg/dL   Total Protein 6.6 6.5 - 8.1 g/dL   Albumin 3.6 3.5 - 5.0 g/dL   AST 33 15 - 41 U/L   ALT 41 0 - 44 U/L   Alkaline Phosphatase 55 38 - 126 U/L   Total Bilirubin 0.5 0.3 - 1.2 mg/dL   GFR, Estimated >63 >01 mL/min    Comment: (NOTE) Calculated using the CKD-EPI Creatinine Equation (2021)    Anion gap 8 5 - 15    Comment: Performed at [facility], [address] [place], [place] [address]  Ethanol     Status: None   Collection Time: 07/22/20  3:30 PM  Result Value Ref Range   Alcohol, Ethyl (B) <10 <10 mg/dL    Comment: (NOTE) Lowest detectable limit for serum alcohol is 10 mg/dL.  For medical purposes only. Performed at [facility], [address] [place], [place] [address]   Salicylate level     Status: Abnormal   Collection Time: 07/22/20  3:30 PM  Result Value Ref Range   Salicylate Lvl <7.0 (L) 7.0 - 30.0 mg/dL    Comment: Performed at [facility], [address] [place], [place] [address]  Acetaminophen level     Status: Abnormal   Collection Time: 07/22/20  3:30 PM  Result Value Ref Range   Acetaminophen (Tylenol), Serum <10 (L) 10 - 30 ug/mL    Comment: (NOTE) Therapeutic concentrations vary significantly. A range of 10-30 ug/mL  may be an effective concentration for many patients. However, some  are best treated at concentrations outside of this range. Acetaminophen concentrations >150 ug/mL at 4 hours after  ingestion  and >50 ug/mL at 12 hours after ingestion are often associated with  toxic reactions.  Performed at [facility], [address] [place], [place] [address]   cbc     Status: Abnormal   Collection Time: 07/22/20  3:30 PM  Result Value Ref Range   WBC 5.0 4.0 - 10.5 K/uL   RBC 4.33 4.22 -  5.81 MIL/uL   Hemoglobin 13.2 13.0 - 17.0 g/dL   HCT 69.6 29.5 - 28.4 %   MCV 92.8 80.0 - 100.0 fL   MCH 30.5 26.0 - 34.0 pg   MCHC 32.8 30.0 - 36.0 g/dL   RDW 13.2 44.0 - 10.2 %   Platelets 93 (L) 150 - 400 K/uL    Comment: Immature Platelet Fraction may be clinically indicated, consider ordering this additional test [id number]    nRBC 0.0 0.0 - 0.2 %    Comment: Performed at [facility] [address] [place], [place] [address]  Troponin I (High Sensitivity)     Status: None   Collection Time: 07/22/20  3:30 PM  Result Value Ref Range   Troponin I (High Sensitivity) 3 <18 ng/L    Comment: (NOTE) Elevated high sensitivity troponin I (hsTnI) values and significant  changes across serial measurements may suggest ACS but many other  chronic and acute conditions are known to elevate hsTnI results.  Refer to the "Links" section for chest pain algorithms and additional  guidance. Performed at [facility], [address] [place], [place] [address]   Urine Drug Screen, Qualitative     Status: Abnormal   Collection Time: 07/22/20  3:31 PM  Result Value Ref Range   Tricyclic, Ur Screen NONE DETECTED NONE DETECTED   Amphetamines, Ur Screen NONE DETECTED NONE DETECTED   MDMA (Ecstasy)Ur Screen NONE DETECTED NONE DETECTED   Cocaine Metabolite,Ur [place] NONE DETECTED NONE DETECTED   Opiate, Ur Screen NONE DETECTED NONE DETECTED   Phencyclidine (PCP) Ur S NONE DETECTED NONE DETECTED   Cannabinoid 50 Ng, Ur [place] POSITIVE (A) NONE DETECTED   Barbiturates, Ur Screen NONE DETECTED NONE DETECTED   Benzodiazepine, Ur Scrn NONE DETECTED NONE DETECTED   Methadone  Scn, Ur NONE DETECTED NONE DETECTED    Comment: (NOTE) Tricyclics + metabolites, urine    Cutoff 1000 ng/mL Amphetamines + metabolites, urine  Cutoff 1000 ng/mL MDMA (Ecstasy), urine              Cutoff 500 ng/mL Cocaine Metabolite, urine          Cutoff 300 ng/mL Opiate + metabolites, urine        Cutoff 300 ng/mL Phencyclidine (PCP), urine         Cutoff 25 ng/mL Cannabinoid, urine                 Cutoff 50 ng/mL Barbiturates + metabolites, urine  Cutoff 200 ng/mL Benzodiazepine, urine              Cutoff 200 ng/mL Methadone, urine                   Cutoff 300 ng/mL  The urine drug screen provides only a preliminary, unconfirmed analytical test result and should not be used for non-medical purposes. Clinical consideration and professional judgment should be applied to any positive drug screen result due to possible interfering substances. A more specific alternate chemical method must be used in order to obtain a confirmed analytical result. Gas chromatography / mass spectrometry (GC/MS) is the preferred confirm atory method. Performed at [facility] [address] [place], [place] [address]     Current Facility-Administered Medications  Medication Dose Route Frequency Provider Last Rate Last Admin  . albuterol (VENTOLIN HFA) 108 (90 Base) MCG/ACT inhaler 2 puff  2 puff Inhalation Once Delton Prairie, MD       Current Outpatient Medications  Medication Sig  Dispense Refill  . albuterol (VENTOLIN HFA) 108 (90 Base) MCG/ACT inhaler INHALE 2 PUFFS INTO THE LUNGS EVERY 6 HOURS AS NEEDED FOR WHEEZING OR SHORTNESS OF BREATH (Patient not taking: Reported on 07/22/2020) 18 g 1  . amLODipine (NORVASC) 10 MG tablet Take 1 tablet (10 mg total) by mouth daily. (Patient not taking: No sig reported) 90 tablet 2  . aspirin 81 MG chewable tablet Chew 1 tablet (81 mg total) by mouth daily. (Patient not taking: No sig reported) 30 tablet 1  . FLUoxetine (PROZAC) 20 MG capsule Take 1  capsule (20 mg total) by mouth daily. 30 capsule 2  . FLUoxetine (PROZAC) 20 MG capsule Take 1 capsule (20 mg total) by mouth daily. (Patient not taking: Reported on 07/22/2020) 30 capsule 3  . gabapentin (NEURONTIN) 100 MG capsule Take 1 capsule (100 mg total) by mouth 3 (three) times daily. (Patient not taking: Reported on 07/22/2020) 90 capsule 0  . levothyroxine (SYNTHROID) 25 MCG tablet Take 1 tablet (25 mcg total) by mouth daily. (Patient not taking: Reported on 07/22/2020) 90 tablet 3  . losartan (COZAAR) 50 MG tablet Take 0.5 tablets (25 mg total) by mouth daily. (Patient not taking: Reported on 07/22/2020) 45 tablet 0  . omeprazole (PRILOSEC) 20 MG capsule Take 1 capsule (20 mg total) by mouth daily. (Patient not taking: Reported on 07/22/2020) 30 capsule 0  . sildenafil (REVATIO) 20 MG tablet Take 1 tablet (20 mg total) by mouth 3 (three) times daily. (Patient not taking: Reported on 07/22/2020) 10 tablet 0    Musculoskeletal: Strength & Muscle Tone: within normal limits Gait & Station: normal Patient leans: N/A            Psychiatric Specialty Exam:  Presentation  General Appearance: No data recorded Eye Contact:No data recorded Speech:No data recorded Speech Volume:No data recorded Handedness:No data recorded  Mood and Affect  Mood:No data recorded Affect:No data recorded  Thought Process  Thought Processes:No data recorded Descriptions of Associations:No data recorded Orientation:No data recorded Thought Content:No data recorded History of Schizophrenia/Schizoaffective disorder:No data recorded Duration of Psychotic Symptoms:No data recorded Hallucinations:No data recorded Ideas of Reference:No data recorded Suicidal Thoughts:No data recorded Homicidal Thoughts:No data recorded  Sensorium  Memory:No data recorded Judgment:No data recorded Insight:No data recorded  Executive Functions  Concentration:No data recorded Attention Span:No data  recorded Recall:No data recorded Fund of Knowledge:No data recorded Language:No data recorded  Psychomotor Activity  Psychomotor Activity:No data recorded  Assets  Assets:No data recorded  Sleep  Sleep:No data recorded  Physical Exam: Physical Exam Vitals and nursing note reviewed.  Constitutional:      Appearance: Normal appearance.  HENT:     Head: Normocephalic.     Nose: Nose normal.  Pulmonary:     Effort: Pulmonary effort is normal.  Musculoskeletal:        General: Normal range of motion.     Cervical back: Normal range of motion.  Neurological:     General: No focal deficit present.     Mental Status: He is alert and oriented to person, place, and time.  Psychiatric:        Attention and Perception: Attention and perception normal.        Mood and Affect: Mood is anxious and depressed.        Speech: Speech normal.        Behavior: Behavior normal. Behavior is cooperative.        Thought Content: Thought content normal.  Cognition and Memory: Cognition and memory normal.        Judgment: Judgment normal.    Review of Systems  Gastrointestinal: Positive for nausea.  Psychiatric/Behavioral: Positive for depression and substance abuse. The patient is nervous/anxious.   All other systems reviewed and are negative.  Blood pressure (!) 176/89, pulse 62, temperature 98.6 F (37 C), resp. rate 18, height 5\' 10"  (1.778 m), weight 49 kg, SpO2 98 %. Body mass index is 15.5 kg/m.  Treatment Plan Summary: Daily contact with patient to assess and evaluate symptoms and progress in treatment, Medication management and Plan Major depressive disorder, recurrent, severe without psychosis:  -Started Celexa 10 mg daily -Admit inpatient or rehab  Insomnia: -Started Remeron 7.5 mg daily at bedtime  Opiate use d/o: -Started clonidine opiate withdrawal protocol  Disposition: Recommend psychiatric Inpatient admission when medically cleared. Supportive therapy provided  about ongoing stressors.  , NP 07/22/2020 6:19 PM

## 2020-07-22 NOTE — ED Provider Notes (Signed)
[facility] Emergency Department Provider Note ____________________________________________   Event Date/Time   First MD Initiated Contact with Patient 07/22/20 1453     (approximate)  I have reviewed the triage vital signs and the nursing notes.  HISTORY  Chief Complaint Depression   HPI Juan Wood is a 65 y.o. malewho presents to the ED for evaluation of his mental health and substance abuse.   Chart review indicates history of polysubstance abuse.   Patient presents to the ED requesting assistance due to depression and Suboxone use.  Patient reports significant psychosocial stressors of the past 1.5 years with multiple family members dying, including his wife.   Reports buying Suboxone illicitly over the past year, cutting the tabs in the quarters and using one quarter tab daily because "it helps me feel a little bit better."  He reports significant depression, anhedonia, but denies any suicidal intent or ideations.  Denies hallucinations.  Further reports chest pain to the left side of his chest since last night that has been constant that he attributes to his anxiety.  Denies increased sputum production from his baseline smoker's cough.  Report smoking 1 PPD.  Denies shortness of breath, syncopal episodes, abdominal pain, emesis.   Past Medical History:  Diagnosis Date  . Alcohol abuse   . Allergy    Hay fever  . Depression   . Drug abuse (HCC)   . Hepatitis C   . Hypertension   . Lower back pain   . Pulmonary embolism (HCC)   . Reynolds syndrome (HCC)   . Thyroid disease     Patient Active Problem List   Diagnosis Date Noted  . Alcohol use disorder, severe, in sustained remission (HCC) 06/06/2019  . Opiate abuse, continuous (HCC) 06/06/2019  . Low back pain 01/26/2016  . Raynaud phenomenon 01/26/2016  . Right hand pain 01/26/2016  . Headache 08/11/2015  . Neck pain 08/11/2015  . Acute maxillary sinusitis 08/11/2015  . Essential  hypertension 08/11/2015  . LVH (left ventricular hypertrophy) 08/11/2015  . Acute renal insufficiency 08/11/2015  . Dehydration 08/11/2015  . Hypokalemia 08/11/2015  . Elevated transaminase level 08/11/2015  . Thrombocytopenia (HCC) 08/11/2015  . Syncope 08/10/2015  . Hepatitis C 05/25/2015  . Alcohol dependence with alcohol-induced mood disorder (HCC)   . 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 Tobacco use disorder 05/23/2015  . Hypothyroidism 05/23/2015  . HTN (hypertension) 05/23/2015  . GERD (gastroesophageal reflux disease) 05/23/2015  . Alcohol abuse with intoxication (HCC) 05/23/2015    Past Surgical History:  Procedure Laterality Date  . HERNIA REPAIR    . KNEE SURGERY     right    Prior to Admission medications   Medication Sig Start Date End Date Taking? Authorizing Provider  albuterol (VENTOLIN HFA) 108 (90 Base) MCG/ACT inhaler INHALE 2 PUFFS INTO THE LUNGS EVERY 6 HOURS AS NEEDED FOR WHEEZING OR SHORTNESS OF BREATH Patient not taking: Reported on 07/22/2020 10/29/19 10/28/20  Tommi Rumps, PA-C  amLODipine (NORVASC) 10 MG tablet Take 1 tablet (10 mg total) by mouth daily. Patient not taking: No sig reported 06/06/19   Emily Filbert, MD  aspirin 81 MG chewable tablet Chew 1 tablet (81 mg total) by mouth daily. Patient not taking: No sig reported 06/06/19   Emily Filbert, MD  FLUoxetine (PROZAC) 20 MG capsule Take 1 capsule (20 mg total) by mouth daily.  06/06/19 06/05/20  Emily FilbertWilliams, Jonathan E, MD  FLUoxetine (PROZAC) 20 MG capsule Take 1 capsule (20 mg total) by mouth daily. Patient not taking: Reported on 07/22/2020 06/06/19   Charm RingsLord, Jamison Y, NP  gabapentin (NEURONTIN) 100 MG capsule Take 1 capsule (100 mg total) by mouth 3 (three) times daily. Patient not taking: Reported on 07/22/2020 06/06/19   Charm RingsLord, Jamison Y, NP   levothyroxine (SYNTHROID) 25 MCG tablet Take 1 tablet (25 mcg total) by mouth daily. Patient not taking: Reported on 07/22/2020 06/06/19   Emily FilbertWilliams, Jonathan E, MD  losartan (COZAAR) 50 MG tablet Take 0.5 tablets (25 mg total) by mouth daily. Patient not taking: Reported on 07/22/2020 06/06/19   Emily FilbertWilliams, Jonathan E, MD  omeprazole (PRILOSEC) 20 MG capsule Take 1 capsule (20 mg total) by mouth daily. Patient not taking: Reported on 07/22/2020 06/06/19   Emily FilbertWilliams, Jonathan E, MD  sildenafil (REVATIO) 20 MG tablet Take 1 tablet (20 mg total) by mouth 3 (three) times daily. Patient not taking: Reported on 07/22/2020 06/06/19   Emily FilbertWilliams, Jonathan E, MD    Allergies Patient has no known allergies.  Family History  Problem Relation Age of Onset  . Heart attack Father        x 2  . Cancer Father        Pancreatic  . Heart attack Mother   . Hypertension Mother   . Cancer Mother        Skin    Social History Social History   Tobacco Use  . Smoking status: Current Every Day Smoker    Packs/day: 0.50    Years: 35.00    Pack years: 17.50    Types: Cigarettes  . Smokeless tobacco: Never Used  Substance Use Topics  . Alcohol use: Yes    Comment: occasional  . Drug use: Yes    Types: Opium, Marijuana    Comment: cocaine and crack in past    Review of Systems  Constitutional: No fever/chills Eyes: No visual changes. ENT: No sore throat. Cardiovascular: Positive for chest pain. Respiratory: Denies shortness of breath. Gastrointestinal: No abdominal pain.  No nausea, no vomiting.   Genitourinary: Negative for dysuria. Musculoskeletal: Negative for back pain. Skin: Negative for rash. Neurological: Negative for headaches, focal weakness or numbness.  Psychiatric: Positive for depression and negative for suicidality  ____________________________________________   PHYSICAL EXAM:  VITAL SIGNS: Vitals:   07/22/20 1436 07/22/20 1643  BP: (!) 171/104 (!) 176/89  Pulse: 70 62  Resp:  20 18  Temp: 98.6 F (37 C) 98.6 F (37 C)  SpO2: 100% 98%     Constitutional: Alert and oriented. Well appearing and in no acute distress.  Pink puffer morphology.  Supine in bed and conversational in full sentences. Eyes: Conjunctivae are normal. PERRL. EOMI. Head: Atraumatic. Nose: No congestion/rhinnorhea. Mouth/Throat: Mucous membranes are moist.  Oropharynx non-erythematous. Neck: No stridor. No cervical spine tenderness to palpation. Cardiovascular: Normal rate, regular rhythm. Grossly normal heart sounds.  Good peripheral circulation. Respiratory: Normal respiratory effort.  No retractions.  Good air movement throughout.  Minimal and scattered expiratory wheezes throughout.. Gastrointestinal: Soft , nondistended,. No CVA tenderness.  RLQ tenderness to palpation with some voluntary guarding.  Otherwise benign abdomen. Musculoskeletal: No lower extremity tenderness nor edema.  No joint effusions. No signs of acute trauma. Neurologic:  Normal speech and language. No gross focal neurologic deficits are appreciated. No gait instability noted. Skin:  Skin is warm, dry and intact. No rash noted. Psychiatric: Mood and affect are  flat. Speech and behavior are normal.  ____________________________________________   LABS (all labs ordered are listed, but only abnormal results are displayed)  Labs Reviewed  COMPREHENSIVE METABOLIC PANEL - Abnormal; Notable for the following components:      Result Value   Creatinine, Ser 0.60 (*)    Calcium 8.8 (*)    All other components within normal limits  SALICYLATE LEVEL - Abnormal; Notable for the following components:   Salicylate Lvl <7.0 (*)    All other components within normal limits  ACETAMINOPHEN LEVEL - Abnormal; Notable for the following components:   Acetaminophen (Tylenol), Serum <10 (*)    All other components within normal limits  CBC - Abnormal; Notable for the following components:   Platelets 93 (*)    All other components  within normal limits  URINE DRUG SCREEN, QUALITATIVE ([facility] ONLY) - Abnormal; Notable for the following components:   Cannabinoid 50 Ng, Ur  POSITIVE (*)    All other components within normal limits  RESP PANEL BY RT-PCR (FLU A&B, COVID) ARPGX2  ETHANOL  TROPONIN I (HIGH SENSITIVITY)   ____________________________________________  12 Lead EKG  Sinus rhythm, rate of 53 bpm.  Leftward axis and normal intervals.  Nonspecific ST changes without STEMI criteria. ____________________________________________  RADIOLOGY  ED MD interpretation: CXR reviewed by me without evidence of acute cardiopulmonary pathology.   CT reviewed by me without evidence of intra-abdominal pathology acutely  Official radiology report(s): CT ABDOMEN PELVIS WO CONTRAST  Result Date: 07/22/2020 CLINICAL DATA:  Right lower quadrant pain for the past week. EXAM: CT ABDOMEN AND PELVIS WITHOUT CONTRAST TECHNIQUE: Multidetector CT imaging of the abdomen and pelvis was performed following the standard protocol without IV contrast. COMPARISON:  CT abdomen pelvis dated June 06, 2019. FINDINGS: Lower chest: No acute abnormality. Hepatobiliary: A few small hepatic cysts are unchanged. Normal gallbladder. No biliary dilatation. Pancreas: Unchanged mildly dilated main pancreatic duct measuring up to 4 mm in diameter. No surrounding inflammatory changes. Spleen: Normal in size without focal abnormality. Adrenals/Urinary Tract: Adrenal glands are unremarkable. Kidneys are normal, without renal calculi, focal lesion, or hydronephrosis. Bladder is unremarkable. Stomach/Bowel: Stomach is within normal limits. Appendix appears normal. No evidence of bowel wall thickening, distention, or inflammatory changes. Vascular/Lymphatic: Aortic atherosclerosis. No enlarged abdominal or pelvic lymph nodes. Reproductive: Prostate is unremarkable. Other: Prior bilateral inguinal hernia repair. No free fluid or pneumoperitoneum. Musculoskeletal: No acute  or significant osseous findings. IMPRESSION: 1. No acute intra-abdominal process. 2. Aortic Atherosclerosis (ICD10-I70.0). Electronically Signed   By: Obie Dredge M.D.   On: 07/22/2020 16:49   DG Chest 2 View  Result Date: 07/22/2020 CLINICAL DATA:  65 year old with acute onset of mid and LEFT-sided chest pain that began this morning. Current smoker. EXAM: CHEST - 2 VIEW COMPARISON:  10/29/2019 and earlier. FINDINGS: Cardiac silhouette normal in size, unchanged. Thoracic aorta mildly long gated, unchanged. Hilar and mediastinal contours otherwise unremarkable. Marked hyperinflation, unchanged. Mildly prominent bronchovascular markings diffusely and mild central peribronchial thickening, unchanged. Lungs otherwise clear. No localized airspace consolidation. No pleural effusions. No pneumothorax. Normal pulmonary vascularity. Visualized bony thorax unremarkable. IMPRESSION: 1.  No acute cardiopulmonary disease. 2. Stable mild changes of chronic bronchitis. 3.  Emphysema. ([id number]) Electronically Signed   By: Hulan Saas M.D.   On: 07/22/2020 16:43    ____________________________________________   PROCEDURES and INTERVENTIONS  Procedure(s) performed (including Critical Care):  .1-3 Lead EKG Interpretation Performed by: Delton Prairie, MD Authorized by: Delton Prairie, MD     Interpretation: normal  ECG rate:  60   ECG rate assessment: normal     Rhythm: sinus rhythm     Ectopy: none     Conduction: normal      Medications  albuterol (VENTOLIN HFA) 108 (90 Base) MCG/ACT inhaler 2 puff (0 puffs Inhalation Not Given 07/22/20 2301)  dicyclomine (BENTYL) tablet 20 mg (has no administration in time range)  hydrOXYzine (ATARAX/VISTARIL) tablet 25 mg (has no administration in time range)  loperamide (IMODIUM) capsule 2-4 mg (has no administration in time range)  methocarbamol (ROBAXIN) tablet 500 mg (has no administration in time range)  naproxen (NAPROSYN) tablet 500 mg (has no  administration in time range)  ondansetron (ZOFRAN-ODT) disintegrating tablet 4 mg (has no administration in time range)  cloNIDine (CATAPRES) tablet 0.1 mg (0.1 mg Oral Given 07/22/20 2301)    Followed by  cloNIDine (CATAPRES) tablet 0.1 mg (has no administration in time range)    Followed by  cloNIDine (CATAPRES) tablet 0.1 mg (has no administration in time range)  citalopram (CELEXA) tablet 10 mg (10 mg Oral Given 07/22/20 2302)  mirtazapine (REMERON) tablet 7.5 mg (7.5 mg Oral Given 07/22/20 2302)  LORazepam (ATIVAN) tablet 1 mg (1 mg Oral Given 07/22/20 1752)    ____________________________________________   MDM / ED COURSE   65 year old male presents to the ED with chest pain, acute depression, anhedonia without evidence of acute medical pathology and amenable to psychiatric disposition.  Patient has no suicidality or hallucinations.  No indication for IVC at this time, he is here voluntarily and wants to stay.  He has some atypical chest pains and RLQ tenderness on my examination, without evidence of acute underlying medical pathology such as diverticulitis, ACS, appendicitis or urologic obstruction.  Medical work-up is benign and I see no barriers to psychiatric disposition.  He remains voluntary and pleasant.    ____________________________________________   FINAL CLINICAL IMPRESSION(S) / ED DIAGNOSES  Final diagnoses:  Acute depression  Anhedonia  Other chest pain     ED Discharge Orders    None       Jenene Kauffmann   Note:  This document was prepared using Dragon voice recognition software and may include unintentional dictation errors.   Delton Prairie, MD 07/22/20 [phone]

## 2020-07-23 NOTE — ED Provider Notes (Signed)
Emergency Medicine Observation Re-evaluation Note  MILT COYE is a 65 y.o. male, seen on rounds today.  Pt initially presented to the ED for complaints of Depression Currently, the patient is resting comfortably.  Physical Exam  BP (!) 176/89 (BP Location: Right Arm)   Pulse 62   Temp 98.6 F (37 C)   Resp 18   Ht 5\' 10"  (1.778 m)   Wt 49 kg   SpO2 98%   BMI 15.50 kg/m  Physical Exam Gen: No acute distress  Resp: Normal rise and fall of chest Neuro: Moving all four extremities Psych: Resting currently, calm and cooperative when awake    ED Course / MDM  EKG:   I have reviewed the labs performed to date as well as medications administered while in observation.  Recent changes in the last 24 hours include no acute events overnight.  Plan  Current plan is for transfer to [facility] in the morning. Patient is not under full IVC at this time.   Yuliza Cara, [facility], DO 07/23/20 [phone]

## 2020-07-23 NOTE — ED Notes (Signed)
Pt given PRN medication for nausea and headache.

## 2020-07-23 NOTE — ED Notes (Signed)
Pt discharged to [facility] VS stable. All belongings sent with patient.  Pt calm and cooperative.

## 2020-07-23 NOTE — BH Assessment (Signed)
Spoke with patient and informed him he was accepted to [facility]. Patient states he was in agreement with the plan.

## 2020-07-23 NOTE — BH Assessment (Signed)
Comprehensive Clinical Assessment (CCA) Screening, Triage and Referral Note  07/23/2020 Juan Wood [id number]  Chief Complaint:  Chief Complaint  Patient presents with  . Depression   Visit Diagnosis: Substance Abuse Disorder and Mood Disorder  Patient Reported Information How did you hear about [place]? Self   Referral name: Self   Referral phone number: No data recorded Whom do you see for routine medical problems? No data recorded  Practice/Facility Name: No data recorded  Practice/Facility Phone Number: No data recorded  Name of Contact: No data recorded  Contact Number: No data recorded  Contact Fax Number: No data recorded  Prescriber Name: No data recorded  Prescriber Address (if known): No data recorded What Is the Reason for Your Visit/Call Today? Seeking detox and MH treatment  How Long Has This Been Causing You Problems? 1 wk - 1 month  Have You Recently Been in Any Inpatient Treatment (Hospital/Detox/Crisis Center/28-Day Program)? No   Name/Location of Program/Hospital:No data recorded  How Long Were You There? No data recorded  When Were You Discharged? No data recorded Have You Ever Received Services From [facility] Before? Yes   Who Do You See at [facility]? Medical and Mental Health Treatment  Have You Recently Had Any Thoughts About Hurting Yourself? No   Are You Planning to Commit Suicide/Harm Yourself At This time?  No  Have you Recently Had Thoughts About Hurting Someone Juan Wood? No   Explanation: No data recorded Have You Used Any Alcohol or Drugs in the Past 24 Hours? Yes   How Long Ago Did You Use Drugs or Alcohol?  No data recorded  What Did You Use and How Much? Suboxone, unknown amount  What Do You Feel Would Help You the Most Today? Alcohol or Drug Use Treatment; Treatment for Depression or other mood problem  Do You Currently Have a Therapist/Psychiatrist? No   Name of Therapist/Psychiatrist: No data recorded  Have You Been Recently  Discharged From Any Office Practice or Programs? No   Explanation of Discharge From Practice/Program:  No data recorded    CCA Screening Triage Referral Assessment Type of Contact: Face-to-Face   Is this Initial or Reassessment? No data recorded  Date Telepsych consult ordered in [facility]:  No data recorded  Time Telepsych consult ordered in [facility]:  No data recorded Patient Reported Information Reviewed? Yes   Patient Left Without Being Seen? No data recorded  Reason for Not Completing Assessment: No data recorded Collateral Involvement: No data recorded Does Patient Have a Court Appointed Legal Guardian? No data recorded  Name and Contact of Legal Guardian:  No data recorded If Minor and Not Living with Parent(s), Who has Custody? No data recorded Is CPS involved or ever been involved? Never  Is APS involved or ever been involved? Never  Patient Determined To Be At Risk for Harm To Self or Others Based on Review of Patient Reported Information or Presenting Complaint? No   Method: No data recorded  Availability of Means: No data recorded  Intent: No data recorded  Notification Required: No data recorded  Additional Information for Danger to Others Potential:  No data recorded  Additional Comments for Danger to Others Potential:  No data recorded  Are There Guns or Other Weapons in Your Home?  No data recorded   Types of Guns/Weapons: No data recorded   Are These Weapons Safely Secured?  No data recorded   Who Could Verify You Are Able To Have These Secured:    No data recorded Do You Have any Outstanding Charges, Pending Court Dates, Parole/Probation? No data recorded Contacted To Inform of Risk of Harm To Self or Others: No data recorded Location of Assessment: [facility] ED  Does Patient Present under Involuntary Commitment? No   IVC Papers Initial File Date: No data recorded  [place] of Residence: [place]  Patient Currently Receiving the Following  Services: Not Receiving Services   Determination of Need: No data recorded  Options For Referral: Inpatient Hospitalization  Lilyan Gilford MS, LCAS, [facility], [facility] Therapeutic Triage Specialist 07/23/2020 8:29 AM

## 2020-07-28 ENCOUNTER — Other Ambulatory Visit: Payer: Self-pay

## 2021-06-20 ENCOUNTER — Ambulatory Visit: Payer: Medicare HMO | Admitting: Gastroenterology

## 2021-06-20 ENCOUNTER — Encounter: Payer: Self-pay | Admitting: Gastroenterology

## 2021-07-08 IMAGING — CT CT ABD-PELV W/O CM
2 of 4 series · 16 of 46 positions shown, 18 images · non-contrast
Comparison: CT abdomen pelvis dated June 06, 2019.

CLINICAL DATA: Right lower quadrant pain for the past week.

EXAM:
CT ABDOMEN AND PELVIS WITHOUT CONTRAST
TECHNIQUE: Multidetector CT imaging of the abdomen and pelvis was performed
following the standard protocol without IV contrast.

[Series 2: axial st · axial · 0.68mm/px · z∈[-681,-281]mm · 13 of 88 slices shown, 15 images]
[im 4/88  soft-tissue]
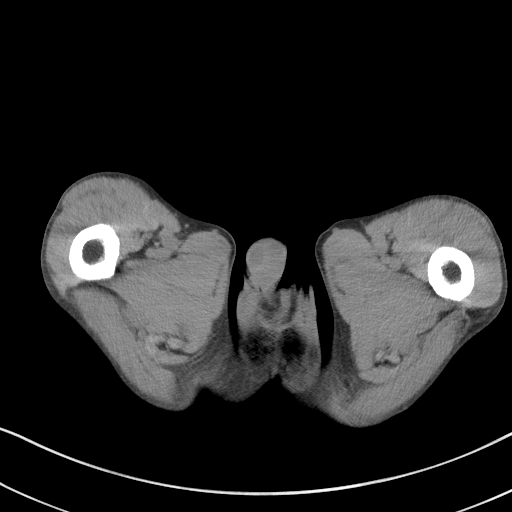
[im 4/88  bone]
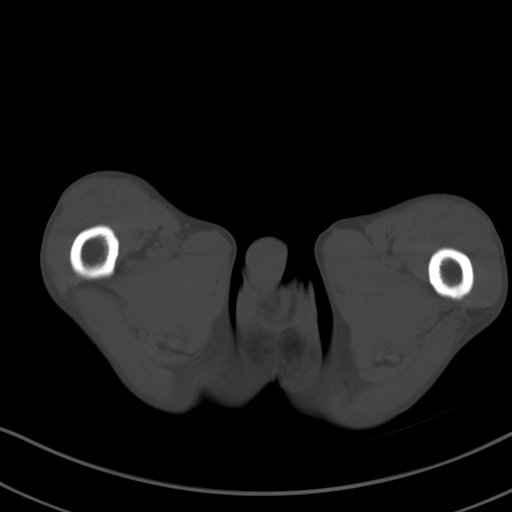
[im 11/88  soft-tissue]
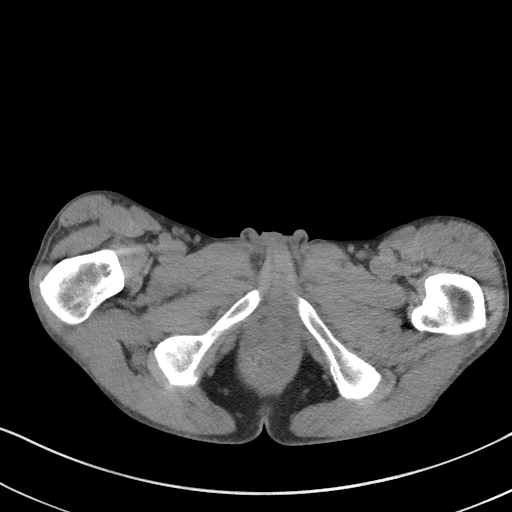
[im 19/88  soft-tissue]
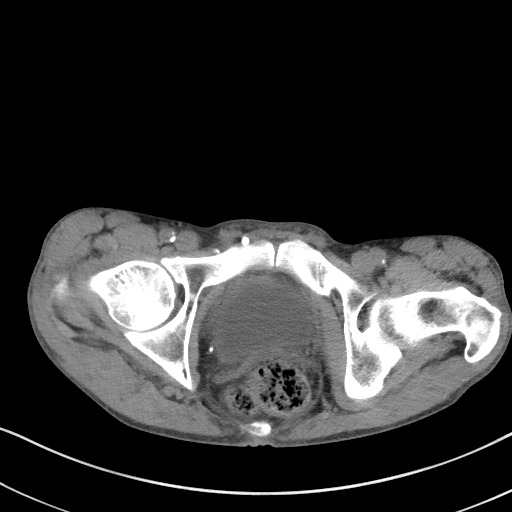
[im 26/88  soft-tissue]
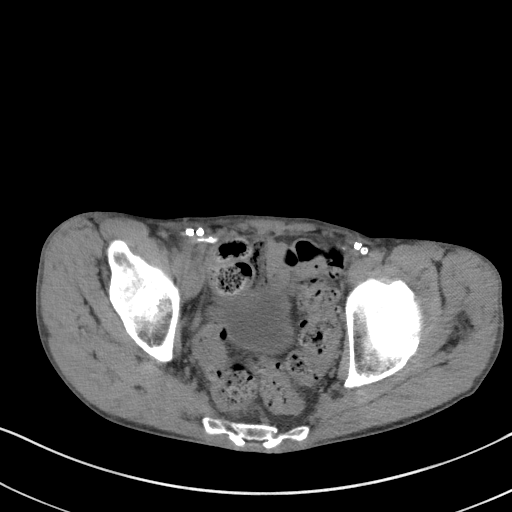
[im 30/88  soft-tissue]
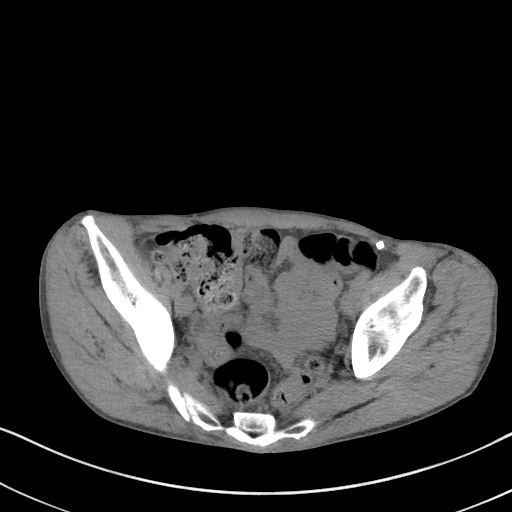
[im 37/88  soft-tissue]
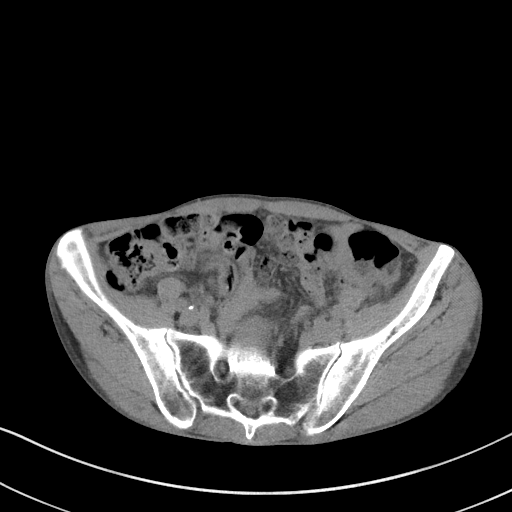
[im 44/88  soft-tissue]
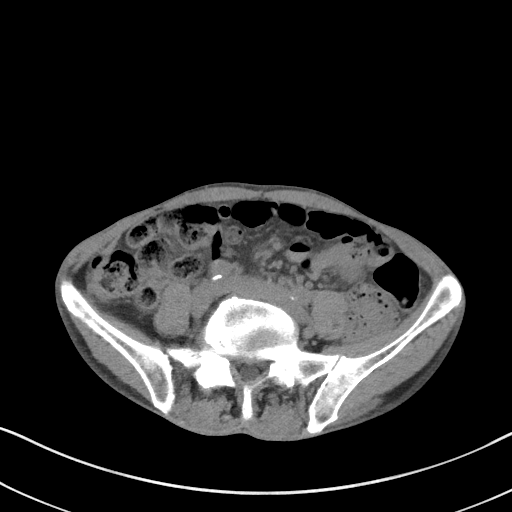
[im 51/88  soft-tissue]
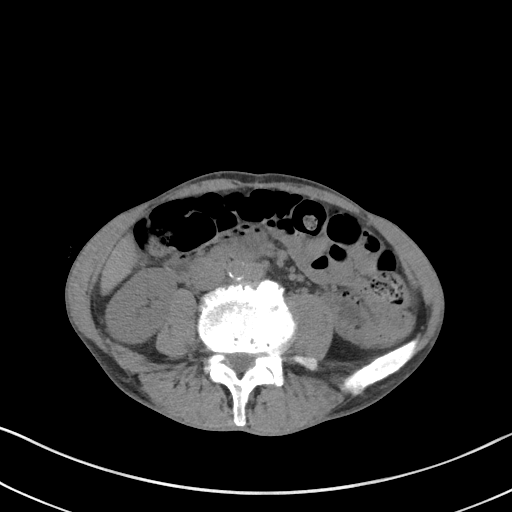
[im 59/88  soft-tissue]
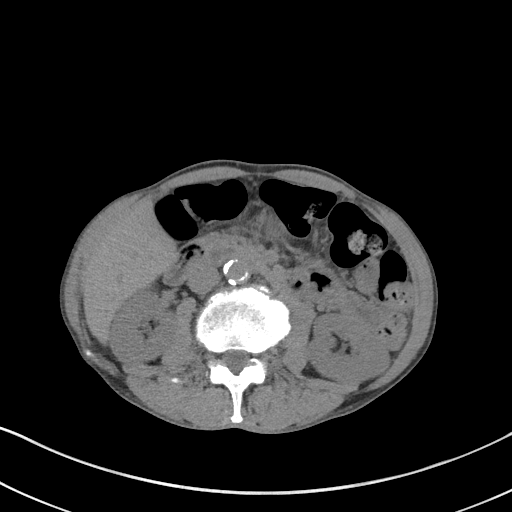
[im 59/88  bone]
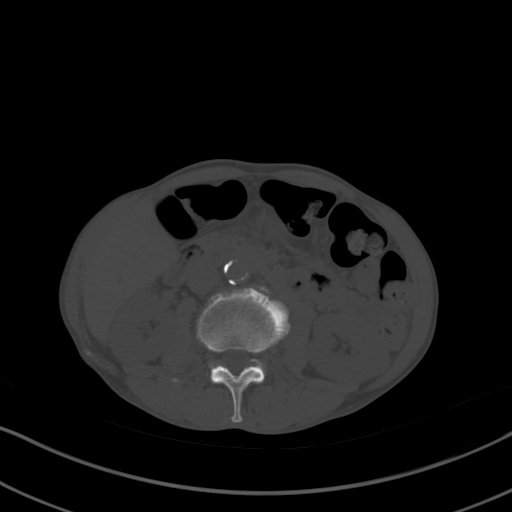
[im 62/88  soft-tissue]
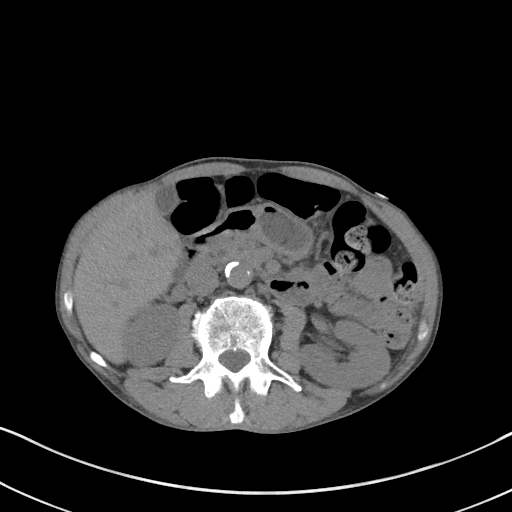
[im 69/88  soft-tissue]
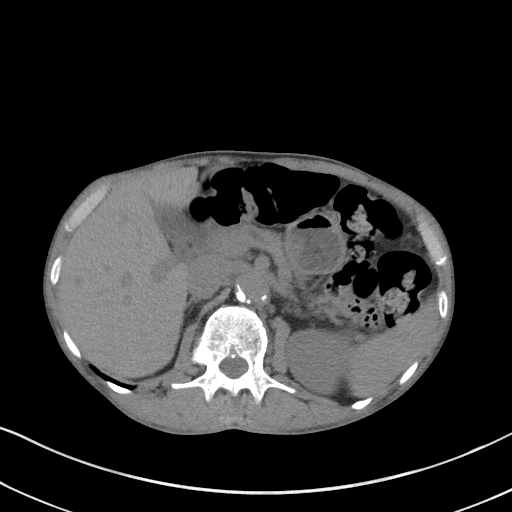
[im 77/88  soft-tissue]
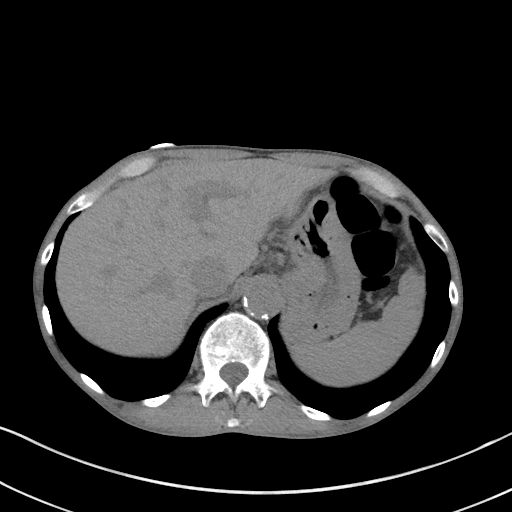
[im 84/88  soft-tissue]
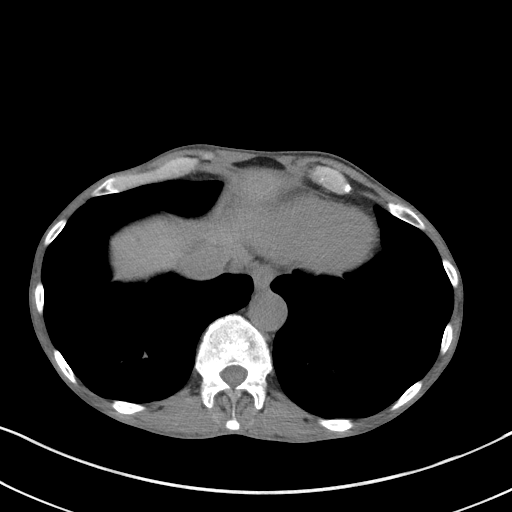

[Series 5: coronal st · coronal · 0.69mm/px · 3 of 90 slices shown]
[im 30/90  soft-tissue]
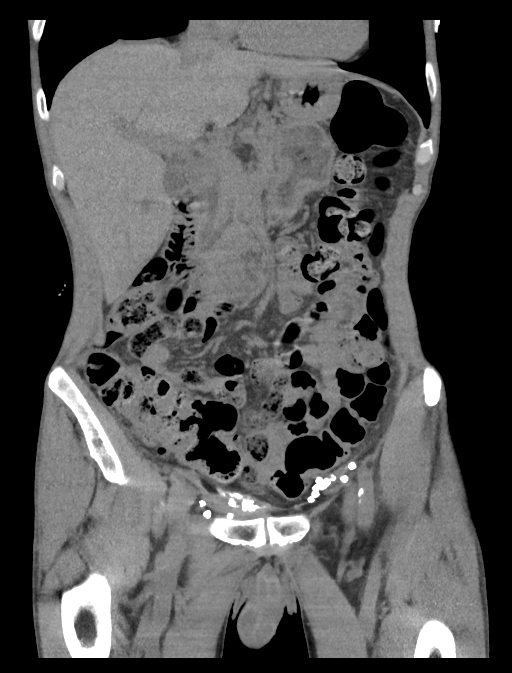
[im 40/90  soft-tissue]
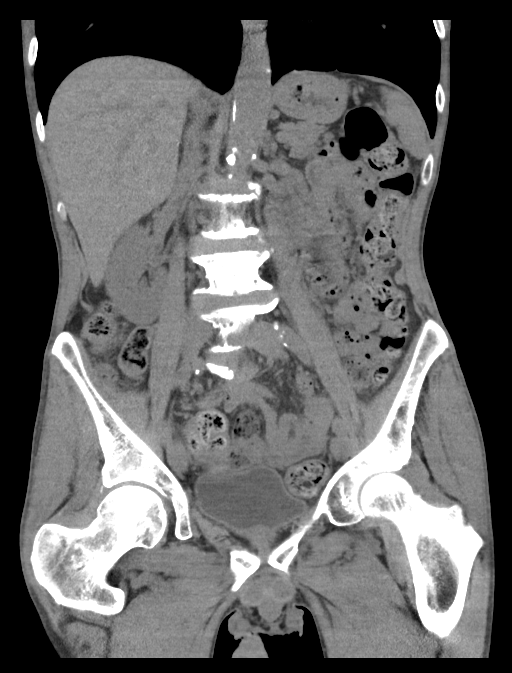
[im 50/90  soft-tissue]
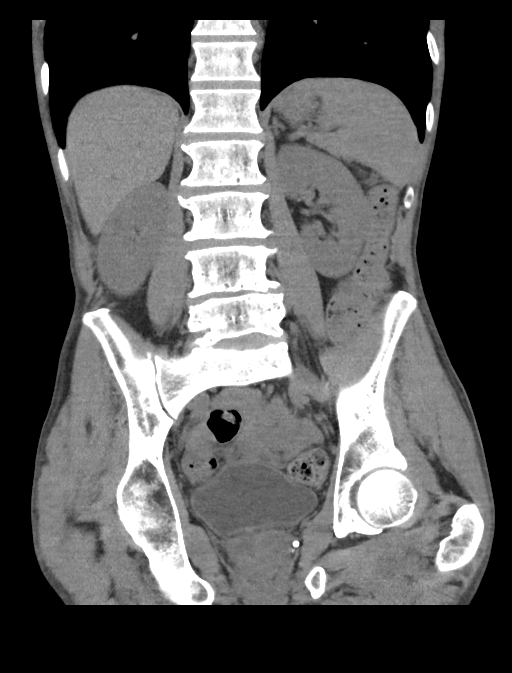

[16 of 46 positions shown; findings below may reference images not displayed]

FINDINGS: Lower chest: No acute abnormality.

Hepatobiliary: A few small hepatic cysts are unchanged. Normal
gallbladder. No biliary dilatation.

Pancreas: Unchanged mildly dilated main pancreatic duct measuring up
to 4 mm in diameter. No surrounding inflammatory changes.

Spleen: Normal in size without focal abnormality.

Adrenals/Urinary Tract: Adrenal glands are unremarkable. Kidneys are
normal, without renal calculi, focal lesion, or hydronephrosis.
Bladder is unremarkable.

Stomach/Bowel: Stomach is within normal limits. Appendix appears
normal. No evidence of bowel wall thickening, distention, or
inflammatory changes.

Vascular/Lymphatic: Aortic atherosclerosis. No enlarged abdominal or
pelvic lymph nodes.

Reproductive: Prostate is unremarkable.

Other: Prior bilateral inguinal hernia repair. No free fluid or
pneumoperitoneum.

Musculoskeletal: No acute or significant osseous findings.
IMPRESSION: 1. No acute intra-abdominal process.
2. Aortic Atherosclerosis ([id number]).

## 2021-11-28 ENCOUNTER — Other Ambulatory Visit: Payer: Self-pay | Admitting: Gastroenterology

## 2021-11-28 DIAGNOSIS — B182 Chronic viral hepatitis C: Secondary | ICD-10-CM

## 2021-11-28 DIAGNOSIS — R634 Abnormal weight loss: Secondary | ICD-10-CM

## 2021-12-04 ENCOUNTER — Ambulatory Visit
Admission: RE | Admit: 2021-12-04 | Discharge: 2021-12-04 | Disposition: A | Discharge status: Home / Self Care | Payer: Medicare HMO | Source: Ambulatory Visit | Attending: Gastroenterology | Admitting: Gastroenterology

## 2021-12-04 DIAGNOSIS — B182 Chronic viral hepatitis C: Secondary | ICD-10-CM | POA: Diagnosis not present

## 2021-12-04 DIAGNOSIS — R634 Abnormal weight loss: Secondary | ICD-10-CM | POA: Diagnosis present

## 2021-12-05 ENCOUNTER — Other Ambulatory Visit (HOSPITAL_COMMUNITY): Payer: Self-pay | Admitting: Gastroenterology

## 2021-12-05 ENCOUNTER — Other Ambulatory Visit: Payer: Self-pay | Admitting: Gastroenterology

## 2021-12-05 DIAGNOSIS — B182 Chronic viral hepatitis C: Secondary | ICD-10-CM

## 2021-12-05 DIAGNOSIS — R634 Abnormal weight loss: Secondary | ICD-10-CM

## 2021-12-05 DIAGNOSIS — R1310 Dysphagia, unspecified: Secondary | ICD-10-CM

## 2021-12-22 ENCOUNTER — Ambulatory Visit
Admission: RE | Admit: 2021-12-22 | Discharge: 2021-12-22 | Disposition: A | Discharge status: Home / Self Care | Payer: Medicare HMO | Source: Ambulatory Visit | Attending: Gastroenterology | Admitting: Gastroenterology

## 2021-12-22 DIAGNOSIS — R634 Abnormal weight loss: Secondary | ICD-10-CM | POA: Diagnosis present

## 2021-12-22 DIAGNOSIS — B182 Chronic viral hepatitis C: Secondary | ICD-10-CM | POA: Insufficient documentation

## 2021-12-22 DIAGNOSIS — N281 Cyst of kidney, acquired: Secondary | ICD-10-CM | POA: Diagnosis not present

## 2021-12-22 DIAGNOSIS — R1310 Dysphagia, unspecified: Secondary | ICD-10-CM | POA: Diagnosis present

## 2021-12-22 DIAGNOSIS — I700 Atherosclerosis of aorta: Secondary | ICD-10-CM | POA: Insufficient documentation

## 2021-12-22 LAB — POCT I-STAT CREATININE: Creatinine, Ser: 0.9 mg/dL (ref 0.61–1.24)

## 2021-12-22 MED ORDER — IOHEXOL 300 MG/ML  SOLN
80.0000 mL | Freq: Once | INTRAMUSCULAR | Status: AC | PRN
  Administered 2021-12-22: 80 mL via INTRAVENOUS

## 2022-02-16 ENCOUNTER — Encounter: Payer: Self-pay | Admitting: Gastroenterology

## 2022-02-26 ENCOUNTER — Other Ambulatory Visit: Payer: Self-pay

## 2022-02-26 ENCOUNTER — Ambulatory Visit: Payer: Medicare HMO | Admitting: Gastroenterology

## 2022-05-04 ENCOUNTER — Encounter: Payer: Self-pay | Admitting: Gastroenterology

## 2022-05-07 ENCOUNTER — Encounter
Admission: RE | Disposition: A | Discharge status: Home / Self Care | Payer: Self-pay | Source: Home / Self Care | Attending: Gastroenterology

## 2022-05-07 ENCOUNTER — Encounter: Payer: Self-pay | Admitting: Gastroenterology

## 2022-05-07 ENCOUNTER — Ambulatory Visit: Payer: Medicare HMO | Admitting: Certified Registered"

## 2022-05-07 ENCOUNTER — Ambulatory Visit
Admission: RE | Admit: 2022-05-07 | Discharge: 2022-05-07 | Disposition: A | Discharge status: Home / Self Care | Payer: Medicare HMO | Attending: Gastroenterology | Admitting: Gastroenterology

## 2022-05-07 DIAGNOSIS — R1310 Dysphagia, unspecified: Secondary | ICD-10-CM | POA: Insufficient documentation

## 2022-05-07 DIAGNOSIS — K259 Gastric ulcer, unspecified as acute or chronic, without hemorrhage or perforation: Secondary | ICD-10-CM | POA: Diagnosis not present

## 2022-05-07 DIAGNOSIS — E039 Hypothyroidism, unspecified: Secondary | ICD-10-CM | POA: Insufficient documentation

## 2022-05-07 DIAGNOSIS — K319 Disease of stomach and duodenum, unspecified: Secondary | ICD-10-CM | POA: Diagnosis not present

## 2022-05-07 DIAGNOSIS — R634 Abnormal weight loss: Secondary | ICD-10-CM | POA: Diagnosis not present

## 2022-05-07 DIAGNOSIS — F17210 Nicotine dependence, cigarettes, uncomplicated: Secondary | ICD-10-CM | POA: Diagnosis not present

## 2022-05-07 DIAGNOSIS — B1920 Unspecified viral hepatitis C without hepatic coma: Secondary | ICD-10-CM | POA: Insufficient documentation

## 2022-05-07 DIAGNOSIS — I10 Essential (primary) hypertension: Secondary | ICD-10-CM | POA: Insufficient documentation

## 2022-05-07 DIAGNOSIS — K2970 Gastritis, unspecified, without bleeding: Secondary | ICD-10-CM | POA: Diagnosis not present

## 2022-05-07 DIAGNOSIS — Z86711 Personal history of pulmonary embolism: Secondary | ICD-10-CM | POA: Insufficient documentation

## 2022-05-07 DIAGNOSIS — Z1211 Encounter for screening for malignant neoplasm of colon: Secondary | ICD-10-CM | POA: Insufficient documentation

## 2022-05-07 DIAGNOSIS — Z681 Body mass index (BMI) 19 or less, adult: Secondary | ICD-10-CM | POA: Insufficient documentation

## 2022-05-07 HISTORY — PX: COLONOSCOPY WITH PROPOFOL: SHX5780

## 2022-05-07 HISTORY — DX: Raynaud's syndrome without gangrene: I73.00

## 2022-05-07 HISTORY — PX: ESOPHAGOGASTRODUODENOSCOPY (EGD) WITH PROPOFOL: SHX5813

## 2022-05-07 SURGERY — ESOPHAGOGASTRODUODENOSCOPY (EGD) WITH PROPOFOL
Anesthesia: General

## 2022-05-07 MED ORDER — LIDOCAINE HCL (PF) 2 % IJ SOLN
INTRAMUSCULAR | Status: AC
  Filled 2022-05-07: qty 5

## 2022-05-07 MED ORDER — PROPOFOL 10 MG/ML IV BOLUS
INTRAVENOUS | Status: DC | PRN
  Administered 2022-05-07: 170 ug/kg/min via INTRAVENOUS
  Administered 2022-05-07: 100 mg via INTRAVENOUS

## 2022-05-07 MED ORDER — PROPOFOL 10 MG/ML IV BOLUS
INTRAVENOUS | Status: AC
  Filled 2022-05-07: qty 60

## 2022-05-07 MED ORDER — SODIUM CHLORIDE 0.9 % IV SOLN: INTRAVENOUS | Status: DC

## 2022-05-07 MED ORDER — GLYCOPYRROLATE 0.2 MG/ML IJ SOLN
INTRAMUSCULAR | Status: AC
  Filled 2022-05-07: qty 1

## 2022-05-07 MED ORDER — PROPOFOL 10 MG/ML IV BOLUS
INTRAVENOUS | Status: AC
  Filled 2022-05-07: qty 20

## 2022-05-07 MED ORDER — PHENYLEPHRINE 80 MCG/ML (10ML) SYRINGE FOR IV PUSH (FOR BLOOD PRESSURE SUPPORT)
PREFILLED_SYRINGE | INTRAVENOUS | Status: AC
  Filled 2022-05-07: qty 10

## 2022-05-07 MED ORDER — LIDOCAINE HCL (CARDIAC) PF 100 MG/5ML IV SOSY
PREFILLED_SYRINGE | INTRAVENOUS | Status: DC | PRN
  Administered 2022-05-07: 100 mg via INTRAVENOUS

## 2022-05-07 MED ORDER — GLYCOPYRROLATE 0.2 MG/ML IJ SOLN
INTRAMUSCULAR | Status: DC | PRN
  Administered 2022-05-07: .2 mg via INTRAVENOUS

## 2022-05-07 NOTE — Op Note (Addendum)
[facility] Gastroenterology Patient Name: Juan Wood Procedure Date: 05/07/2022 9:17 AM MRN: [id number] Account #: [id number] Date of Birth: 15-Jan-1956 Admit Type: Outpatient Age: 67 Room: [facility] ENDO ROOM 2 Gender: Male Note Status: Supervisor Override Instrument Name: Jasper Riling [id number] Procedure:             Colonoscopy Indications:           Weight loss Providers:             Rueben Bash, DO Referring MD:          Hattie Perch. Sherril Cong (Referring MD) Medicines:             Monitored Anesthesia Care Complications:         No immediate complications. Estimated blood loss: None. Procedure:             Pre-Anesthesia Assessment:                        - Prior to the procedure, a History and Physical was                         performed, and patient medications and allergies were                         reviewed. The patient is competent. The risks and                         benefits of the procedure and the sedation options and                         risks were discussed with the patient. All questions                         were answered and informed consent was obtained.                         Patient identification and proposed procedure were                         verified by the physician, the nurse, the anesthetist                         and the technician in the endoscopy suite. Mental                         Status Examination: alert and oriented. Airway                         Examination: normal oropharyngeal airway and neck                         mobility. Respiratory Examination: clear to                         auscultation. CV Examination: RRR, no murmurs, no S3                         or S4. Prophylactic Antibiotics: The patient does not  require prophylactic antibiotics. Prior                         Anticoagulants: The patient has taken no anticoagulant                         or antiplatelet agents. ASA  Grade Assessment: III - A                         patient with severe systemic disease. After reviewing                         the risks and benefits, the patient was deemed in                         satisfactory condition to undergo the procedure. The                         anesthesia plan was to use monitored anesthesia care                         (MAC). Immediately prior to administration of                         medications, the patient was re-assessed for adequacy                         to receive sedatives. The heart rate, respiratory                         rate, oxygen saturations, blood pressure, adequacy of                         pulmonary ventilation, and response to care were                         monitored throughout the procedure. The physical                         status of the patient was re-assessed after the                         procedure.                        After obtaining informed consent, the colonoscope was                         passed under direct vision. Throughout the procedure,                         the patient's blood pressure, pulse, and oxygen                         saturations were monitored continuously. The procedure                         was aborted. The colonscope was not inserted.  Medications were given. The colonoscopy was aborted                         due to poor bowel prep with stool present. Liquid and                         solid stool coming out prior to even starting- did not                         allow for the successful completion of the procedure.                         The patient tolerated the procedure well. The quality                         of the bowel preparation was poor; aborted before                         starting- solid and liquid stool coming prior to                         starting. Findings:      The perianal and digital rectal examinations were normal. Pertinent        negatives include normal sphincter tone; + for stool.      Scope not inserted as liquid and solid brown stool were being expressed       prior to starting. Estimated blood loss: none. Impression:            - The procedure was aborted due to poor bowel prep                         with stool present.                        - No specimens collected. Recommendation:        - Patient has a contact number available for                         emergencies. The signs and symptoms of potential                         delayed complications were discussed with the patient.                         Return to normal activities tomorrow. Written                         discharge instructions were provided to the patient.                        - Discharge patient to home.                        - Resume previous diet.                        - Continue present medications.                        -  Repeat colonoscopy within 6 months because the bowel                         preparation was poor.                        - Return to GI office as previously scheduled.                        - Start Hepatitis C treatment                        - The findings and recommendations were discussed with                         the patient. Diagnosis Code(s):     --- Professional ---                        Z12.11, Encounter for screening for malignant neoplasm                         of colon Attending Participation:      I personally performed the entire procedure. Volney American, DO Annamaria Helling DO, DO 05/07/2022 10:03:02 AM This report has been signed electronically. Number of Addenda: 0 Note Initiated On: 05/07/2022 9:17 AM Estimated Blood Loss:  Estimated blood loss: none.      [facility]

## 2022-05-07 NOTE — Op Note (Signed)
[facility] Gastroenterology Patient Name: Juan Wood Procedure Date: 05/07/2022 9:22 AM MRN: [id number] Account #: [id number] Date of Birth: 02/08/1956 Admit Type: Outpatient Age: 67 Room: [facility] ENDO ROOM 2 Gender: Male Note Status: Finalized Instrument Name: Upper Endoscope [id number] Procedure:             Upper GI endoscopy Indications:           Dysphagia, Weight loss Providers:             Annamaria Helling DO, DO Medicines:             Monitored Anesthesia Care Complications:         No immediate complications. Estimated blood loss:                         Minimal. Procedure:             Pre-Anesthesia Assessment:                        - Prior to the procedure, a History and Physical was                         performed, and patient medications and allergies were                         reviewed. The patient is competent. The risks and                         benefits of the procedure and the sedation options and                         risks were discussed with the patient. All questions                         were answered and informed consent was obtained.                         Patient identification and proposed procedure were                         verified by the physician, the nurse, the anesthetist                         and the technician in the endoscopy suite. Mental                         Status Examination: alert and oriented. Airway                         Examination: normal oropharyngeal airway and neck                         mobility. Respiratory Examination: clear to                         auscultation. CV Examination: RRR, no murmurs, no S3                         or S4. Prophylactic Antibiotics: The patient does  not                         require prophylactic antibiotics. Prior                         Anticoagulants: The patient has taken no anticoagulant                         or antiplatelet agents. ASA Grade Assessment:  III - A                         patient with severe systemic disease. After reviewing                         the risks and benefits, the patient was deemed in                         satisfactory condition to undergo the procedure. The                         anesthesia plan was to use monitored anesthesia care                         (MAC). Immediately prior to administration of                         medications, the patient was re-assessed for adequacy                         to receive sedatives. The heart rate, respiratory                         rate, oxygen saturations, blood pressure, adequacy of                         pulmonary ventilation, and response to care were                         monitored throughout the procedure. The physical                         status of the patient was re-assessed after the                         procedure.                        After obtaining informed consent, the endoscope was                         passed under direct vision. Throughout the procedure,                         the patient's blood pressure, pulse, and oxygen                         saturations were monitored continuously. The Endoscope  was introduced through the mouth, and advanced to the                         second part of duodenum. The upper GI endoscopy was                         accomplished without difficulty. The patient tolerated                         the procedure well. Findings:      The duodenal bulb, first portion of the duodenum and second portion of       the duodenum were normal. Biopsies for histology were taken with a cold       forceps for evaluation of celiac disease. Estimated blood loss was       minimal.      Localized moderate inflammation characterized by erythema was found in       the gastric antrum. Biopsies were taken with a cold forceps for       Helicobacter pylori testing. Estimated blood loss was minimal.       One non-bleeding linear gastric ulcer with a clean ulcer base (Forrest       Class III) was found in the gastric antrum. The lesion was 1 mm in       largest dimension. Biopsies were taken with a cold forceps for       histology. Estimated blood loss was minimal.      The exam of the stomach was otherwise normal.      The Z-line was regular. Estimated blood loss: none.      Esophagogastric landmarks were identified: the gastroesophageal junction       was found at 45 cm from the incisors.      Normal mucosa was found in the entire esophagus. The scope was       withdrawn. Dilation was performed with a Maloney dilator with no       resistance at 52 Fr. The dilation site was examined following endoscope       reinsertion and showed no change. Estimated blood loss: none.      The exam was otherwise without abnormality. Impression:            - Normal duodenal bulb, first portion of the duodenum                         and second portion of the duodenum. Biopsied.                        - Gastritis. Biopsied.                        - Non-bleeding gastric ulcer with a clean ulcer base                         (Forrest Class III). Biopsied.                        - Z-line regular.                        - Esophagogastric landmarks identified.                        -  Normal mucosa was found in the entire esophagus.                         Dilated.                        - The examination was otherwise normal. Recommendation:        - Patient has a contact number available for                         emergencies. The signs and symptoms of potential                         delayed complications were discussed with the patient.                         Return to normal activities tomorrow. Written                         discharge instructions were provided to the patient.                        - Discharge patient to home.                        - Resume previous diet.                        -  Continue present medications.                        - initiate proton pump inhibitor therapy if not                         already on.                        - Await pathology results.                        - No aspirin, ibuprofen, naproxen, or other                         non-steroidal anti-inflammatory drugs.                        - Return to GI clinic as previously scheduled.                        - initiate hepatitis c treatment                        - proceed with colonoscopy                        - The findings and recommendations were discussed with                         the patient. Procedure Code(s):     --- Professional ---  [id number], Esophagogastroduodenoscopy, flexible,                         transoral; with biopsy, single or multiple                        43450, Dilation of esophagus, by unguided sound or                         bougie, single or multiple passes Diagnosis Code(s):     --- Professional ---                        K29.70, Gastritis, unspecified, without bleeding                        K25.9, Gastric ulcer, unspecified as acute or chronic,                         without hemorrhage or perforation                        R13.10, Dysphagia, unspecified                        R63.4, Abnormal weight loss CPT copyright 2022 American [employer] Association. All rights reserved. The codes documented in this report are preliminary and upon coder review may  be revised to meet current compliance requirements. Attending Participation:      I personally performed the entire procedure. Volney American, DO Annamaria Helling DO, DO 05/07/2022 9:54:34 AM This report has been signed electronically. Number of Addenda: 0 Note Initiated On: 05/07/2022 9:22 AM Estimated Blood Loss:  Estimated blood loss was minimal.      [facility]

## 2022-05-07 NOTE — H&P (Signed)
Pre-Procedure H&P   Patient ID: Juan Wood is a 67 y.o. male.  Gastroenterology Provider: Annamaria Helling, DO  Referring Provider: Laurine Blazer, PA PCP: Frazier Richards, MD  Date: 05/07/2022  HPI Juan Wood is a 67 y.o. male who presents today for Esophagogastroduodenoscopy and Colonoscopy for Weight loss, dysphagia .  Patient undergoing EGD and colonoscopy for weight loss and dysphagia.  Dysphagia is only to dry breads- no other foods.  No issues with liquids or pills.  No odynophagia.  He has noted weight loss.  20 pounds after his wife died approximately 3 years ago and additional 10 pounds unexpectedly for total of 35 over 3 years. Reports he has been stable at 115 lbs since September.  No nausea vomiting abdominal pain or early satiety.  History of IV drug use but denies use currently.  Previous history of alcohol use. Positive for hepatitis C.  Treatment nave Current tobacco use and Suboxone use. CT performed demonstrating constipation Recent lab work hemoglobin 13 MCV 93 platelets 142,000 INR 1.0   Past Medical History:  Diagnosis Date   Alcohol abuse    Allergy    Hay fever   Depression    Drug abuse ([place])    Hepatitis C    Hypertension    Lower back pain    Pulmonary embolism ([place])    Raynaud's disease    Thyroid disease    TIA (transient ischemic attack) 2004    Past Surgical History:  Procedure Laterality Date   HERNIA REPAIR     KNEE SURGERY     right    Family History Father-pancreatic cancer No other h/o GI disease or malignancy  Review of Systems  Constitutional:  Positive for unexpected weight change. Negative for activity change, appetite change, chills, diaphoresis, fatigue and fever.  HENT:  Positive for trouble swallowing. Negative for voice change.   Respiratory:  Negative for shortness of breath and wheezing.   Cardiovascular:  Negative for chest pain, palpitations and leg swelling.  Gastrointestinal:  Positive for  constipation. Negative for abdominal distention, abdominal pain, anal bleeding, blood in stool, diarrhea, nausea and vomiting.  Musculoskeletal:  Negative for arthralgias and myalgias.  Skin:  Negative for color change and pallor.  Neurological:  Negative for dizziness, syncope and weakness.  Psychiatric/Behavioral:  Negative for confusion. The patient is not nervous/anxious.   All other systems reviewed and are negative.    Medications No current facility-administered medications on file prior to encounter.   Current Outpatient Medications on File Prior to Encounter  Medication Sig Dispense Refill   albuterol (VENTOLIN HFA) 108 (90 Base) MCG/ACT inhaler INHALE 2 PUFFS INTO THE LUNGS EVERY 6 HOURS AS NEEDED FOR WHEEZING OR SHORTNESS OF BREATH (Patient not taking: Reported on 07/22/2020) 18 g 1   aspirin 81 MG chewable tablet Chew 1 tablet (81 mg total) by mouth daily. (Patient not taking: No sig reported) 30 tablet 1   levothyroxine (SYNTHROID) 25 MCG tablet Take 1 tablet (25 mcg total) by mouth daily. (Patient not taking: Reported on 07/22/2020) 90 tablet 3   losartan (COZAAR) 50 MG tablet Take 0.5 tablets (25 mg total) by mouth daily. (Patient not taking: Reported on 07/22/2020) 45 tablet 0   omeprazole (PRILOSEC) 20 MG capsule Take 1 capsule (20 mg total) by mouth daily. (Patient not taking: Reported on 07/22/2020) 30 capsule 0   sildenafil (REVATIO) 20 MG tablet Take 1 tablet (20 mg total) by mouth 3 (three) times daily. (Patient not  taking: Reported on 07/22/2020) 10 tablet 0    Pertinent medications related to GI and procedure were reviewed by me with the patient prior to the procedure   Current Facility-Administered Medications:    0.9 %  sodium chloride infusion, , Intravenous, Continuous, Annamaria Helling, DO, Last Rate: 20 mL/hr at 05/07/22 0921, New Bag at 05/07/22 [id number]      No Known Allergies Allergies were reviewed by me prior to the procedure  Objective   Body mass  index is 16.64 kg/m. Vitals:   05/07/22 0904  BP: (!) 141/82  Pulse: (!) 56  Resp: 20  Temp: 97.6 F (36.4 C)  TempSrc: Temporal  SpO2: 99%  Weight: 52.6 kg  Height: '5\' 10"'$  (1.778 m)     Physical Exam Vitals and nursing note reviewed.  Constitutional:      General: He is not in acute distress.    Appearance: He is not ill-appearing, toxic-appearing or diaphoretic.     Comments: Thin appearing  HENT:     Head: Normocephalic and atraumatic.     Nose: Nose normal.     Mouth/Throat:     Mouth: Mucous membranes are moist.     Pharynx: Oropharynx is clear.  Eyes:     General: No scleral icterus.    Extraocular Movements: Extraocular movements intact.  Cardiovascular:     Rate and Rhythm: Regular rhythm. Bradycardia present.     Heart sounds: Normal heart sounds. No murmur heard.    No friction rub. No gallop.  Pulmonary:     Effort: Pulmonary effort is normal. No respiratory distress.     Breath sounds: Normal breath sounds. No wheezing, rhonchi or rales.  Abdominal:     General: Bowel sounds are normal. There is no distension.     Palpations: Abdomen is soft.     Tenderness: There is no abdominal tenderness. There is no guarding or rebound.  Musculoskeletal:     Cervical back: Neck supple.     Right lower leg: No edema.     Left lower leg: No edema.  Skin:    General: Skin is warm and dry.     Coloration: Skin is not jaundiced or pale.  Neurological:     General: No focal deficit present.     Mental Status: He is alert and oriented to person, place, and time. Mental status is at baseline.  Psychiatric:        Mood and Affect: Mood normal.        Behavior: Behavior normal.        Thought Content: Thought content normal.        Judgment: Judgment normal.      Assessment:  Juan Wood is a 67 y.o. male  who presents today for Esophagogastroduodenoscopy and Colonoscopy for Weight loss, dysphagia .  Plan:  Esophagogastroduodenoscopy and Colonoscopy with  possible intervention today  Esophagogastroduodenoscopy and Colonoscopy with possible biopsy, control of bleeding, polypectomy, and interventions as necessary has been discussed with the patient/patient representative. Informed consent was obtained from the patient/patient representative after explaining the indication, nature, and risks of the procedure including but not limited to death, bleeding, perforation, missed neoplasm/lesions, cardiorespiratory compromise, and reaction to medications. Opportunity for questions was given and appropriate answers were provided. Patient/patient representative has verbalized understanding is amenable to undergoing the procedure.   Annamaria Helling, DO  [facility] Gastroenterology  Portions of the record may have been created with voice recognition software. Occasional wrong-word or 'sound-a-like' substitutions  may have occurred due to the inherent limitations of voice recognition software.  Read the chart carefully and recognize, using context, where substitutions may have occurred.

## 2022-05-07 NOTE — Progress Notes (Signed)
Colonoscopy aborted due to solid brown stool

## 2022-05-07 NOTE — Anesthesia Preprocedure Evaluation (Addendum)
Anesthesia Evaluation  Patient identified by MRN, date of birth, ID band Patient awake    Reviewed: Allergy & Precautions, NPO status , Patient's Chart, lab work & pertinent test results  History of Anesthesia Complications Negative for: history of anesthetic complications  Airway Mallampati: IV   Neck ROM: Full    Dental  (+) Upper Dentures, Lower Dentures   Pulmonary Current Smoker (less than 10 cigarettes per day)Patient did not abstain from smoking.   Pulmonary exam normal breath sounds clear to auscultation       Cardiovascular hypertension, Normal cardiovascular exam Rhythm:Regular Rate:Normal  Hx PE   Neuro/Psych  Headaches PSYCHIATRIC DISORDERS  Depression    Alcohol use disorder (approx 6 beers per month, attempting to decrease use); marijuana use; chronic lower back pain    GI/Hepatic negative GI ROS,,,(+) Hepatitis -, C  Endo/Other  Hypothyroidism    Renal/GU negative Renal ROS     Musculoskeletal   Abdominal   Peds  Hematology negative hematology ROS (+)   Anesthesia Other Findings   Reproductive/Obstetrics                             Anesthesia Physical Anesthesia Plan  ASA: 3  Anesthesia Plan: General   Post-op Pain Management:    Induction: Intravenous  PONV Risk Score and Plan: 1 and Propofol infusion, TIVA and Treatment may vary due to age or medical condition  Airway Management Planned: Natural Airway  Additional Equipment:   Intra-op Plan:   Post-operative Plan:   Informed Consent: I have reviewed the patients History and Physical, chart, labs and discussed the procedure including the risks, benefits and alternatives for the proposed anesthesia with the patient or authorized representative who has indicated his/her understanding and acceptance.       Plan Discussed with: CRNA  Anesthesia Plan Comments: (LMA/GETA backup discussed.  Patient consented  for risks of anesthesia including but not limited to:  - adverse reactions to medications - damage to eyes, teeth, lips or other oral mucosa - nerve damage due to positioning  - sore throat or hoarseness - damage to heart, brain, nerves, lungs, other parts of body or loss of life  Informed patient about role of CRNA in peri- and intra-operative care.  Patient voiced understanding.)       Anesthesia Quick Evaluation

## 2022-05-07 NOTE — Transfer of Care (Signed)
Immediate Anesthesia Transfer of Care Note  Patient: Juan Wood  Procedure(s) Performed: ESOPHAGOGASTRODUODENOSCOPY (EGD) WITH PROPOFOL COLONOSCOPY WITH PROPOFOL  Patient Location: PACU  Anesthesia Type:General  Level of Consciousness: drowsy  Airway & Oxygen Therapy: Patient Spontanous Breathing  Post-op Assessment: Report given to RN and Post -op Vital signs reviewed and stable  Post vital signs: Reviewed and stable  Last Vitals:  Vitals Value Taken Time  BP 106/66 05/07/22 1001  Temp 35.8 C 05/07/22 1001  Pulse 57 05/07/22 1001  Resp 16 05/07/22 1001  SpO2 100 % 05/07/22 1001    Last Pain:  Vitals:   05/07/22 1001  TempSrc: Temporal  PainSc: Asleep         Complications: No notable events documented.

## 2022-05-07 NOTE — Anesthesia Postprocedure Evaluation (Signed)
Anesthesia Post Note  Patient: DAXON GREASON  Procedure(s) Performed: ESOPHAGOGASTRODUODENOSCOPY (EGD) WITH PROPOFOL COLONOSCOPY WITH PROPOFOL  Patient location during evaluation: PACU Anesthesia Type: General Level of consciousness: awake and alert, oriented and patient cooperative Pain management: pain level controlled Vital Signs Assessment: post-procedure vital signs reviewed and stable Respiratory status: spontaneous breathing, nonlabored ventilation and respiratory function stable Cardiovascular status: blood pressure returned to baseline and stable Postop Assessment: adequate PO intake Anesthetic complications: no   No notable events documented.   Last Vitals:  Vitals:   05/07/22 1011 05/07/22 1021  BP: 116/73 (!) 146/89  Pulse: (!) 58 65  Resp: 16 17  Temp:    SpO2: 100% 100%    Last Pain:  Vitals:   05/07/22 1021  TempSrc:   PainSc: 0-No pain                 Darrin Nipper

## 2022-05-07 NOTE — Interval H&P Note (Signed)
History and Physical Interval Note: Preprocedure H&P from 05/07/22  was reviewed and there was no interval change after seeing and examining the patient.  Written consent was obtained from the patient after discussion of risks, benefits, and alternatives. Patient has consented to proceed with Esophagogastroduodenoscopy and Colonoscopy with possible intervention   05/07/2022 9:34 AM  Juan Wood  has presented today for surgery, with the diagnosis of Weight loss  Dysphagia.  The various methods of treatment have been discussed with the patient and family. After consideration of risks, benefits and other options for treatment, the patient has consented to  Procedure(s): ESOPHAGOGASTRODUODENOSCOPY (EGD) WITH PROPOFOL (N/A) COLONOSCOPY WITH PROPOFOL (N/A) as a surgical intervention.  The patient's history has been reviewed, patient examined, no change in status, stable for surgery.  I have reviewed the patient's chart and labs.  Questions were answered to the patient's satisfaction.     Annamaria Helling

## 2022-05-07 NOTE — OR Nursing (Signed)
Waiting on daugther to get here so we may discharge patient home

## 2022-05-08 ENCOUNTER — Encounter: Payer: Self-pay | Admitting: Gastroenterology

## 2022-05-08 LAB — SURGICAL PATHOLOGY

## 2022-08-09 ENCOUNTER — Encounter: Payer: Self-pay | Admitting: Gastroenterology

## 2022-08-10 ENCOUNTER — Encounter: Admission: RE | Payer: Self-pay | Source: Home / Self Care

## 2022-08-10 ENCOUNTER — Ambulatory Visit: Admission: RE | Admit: 2022-08-10 | Payer: Medicare HMO | Source: Home / Self Care | Admitting: Gastroenterology

## 2022-08-10 SURGERY — COLONOSCOPY WITH PROPOFOL
Anesthesia: General

## 2022-08-22 ENCOUNTER — Ambulatory Visit: Payer: Medicare HMO

## 2022-08-22 DIAGNOSIS — D122 Benign neoplasm of ascending colon: Secondary | ICD-10-CM

## 2022-08-22 DIAGNOSIS — D128 Benign neoplasm of rectum: Secondary | ICD-10-CM

## 2022-08-22 DIAGNOSIS — D123 Benign neoplasm of transverse colon: Secondary | ICD-10-CM

## 2022-08-22 DIAGNOSIS — D124 Benign neoplasm of descending colon: Secondary | ICD-10-CM

## 2022-08-22 DIAGNOSIS — D125 Benign neoplasm of sigmoid colon: Secondary | ICD-10-CM

## 2022-08-22 DIAGNOSIS — K5730 Diverticulosis of large intestine without perforation or abscess without bleeding: Secondary | ICD-10-CM

## 2022-08-22 DIAGNOSIS — Z1211 Encounter for screening for malignant neoplasm of colon: Secondary | ICD-10-CM

## 2022-11-08 ENCOUNTER — Ambulatory Visit: Admit: 2022-11-08 | Payer: Medicare HMO | Admitting: Gastroenterology

## 2022-11-08 SURGERY — COLONOSCOPY WITH PROPOFOL
Anesthesia: General

## 2023-05-28 DIAGNOSIS — D696 Thrombocytopenia, unspecified: Secondary | ICD-10-CM | POA: Diagnosis not present

## 2023-05-28 DIAGNOSIS — D649 Anemia, unspecified: Secondary | ICD-10-CM | POA: Diagnosis not present

## 2023-05-28 DIAGNOSIS — Z1331 Encounter for screening for depression: Secondary | ICD-10-CM | POA: Diagnosis not present

## 2023-05-28 DIAGNOSIS — Z1389 Encounter for screening for other disorder: Secondary | ICD-10-CM | POA: Diagnosis not present

## 2023-05-28 DIAGNOSIS — Z0131 Encounter for examination of blood pressure with abnormal findings: Secondary | ICD-10-CM | POA: Diagnosis not present

## 2023-05-28 DIAGNOSIS — F332 Major depressive disorder, recurrent severe without psychotic features: Secondary | ICD-10-CM | POA: Diagnosis not present

## 2023-05-28 DIAGNOSIS — Z0130 Encounter for examination of blood pressure without abnormal findings: Secondary | ICD-10-CM | POA: Diagnosis not present

## 2023-05-28 DIAGNOSIS — E039 Hypothyroidism, unspecified: Secondary | ICD-10-CM | POA: Diagnosis not present

## 2023-05-28 DIAGNOSIS — Z0000 Encounter for general adult medical examination without abnormal findings: Secondary | ICD-10-CM | POA: Diagnosis not present

## 2023-05-28 DIAGNOSIS — J449 Chronic obstructive pulmonary disease, unspecified: Secondary | ICD-10-CM | POA: Diagnosis not present

## 2023-06-05 DIAGNOSIS — Z1211 Encounter for screening for malignant neoplasm of colon: Secondary | ICD-10-CM | POA: Diagnosis not present

## 2023-06-09 LAB — COLOGUARD: COLOGUARD: NEGATIVE

## 2023-06-26 DIAGNOSIS — Z1159 Encounter for screening for other viral diseases: Secondary | ICD-10-CM | POA: Diagnosis not present

## 2023-06-26 DIAGNOSIS — F332 Major depressive disorder, recurrent severe without psychotic features: Secondary | ICD-10-CM | POA: Diagnosis not present

## 2023-06-26 DIAGNOSIS — Z0130 Encounter for examination of blood pressure without abnormal findings: Secondary | ICD-10-CM | POA: Diagnosis not present

## 2023-06-26 DIAGNOSIS — Z1389 Encounter for screening for other disorder: Secondary | ICD-10-CM | POA: Diagnosis not present

## 2023-06-26 DIAGNOSIS — G3184 Mild cognitive impairment, so stated: Secondary | ICD-10-CM | POA: Diagnosis not present

## 2023-06-26 DIAGNOSIS — B1920 Unspecified viral hepatitis C without hepatic coma: Secondary | ICD-10-CM | POA: Diagnosis not present

## 2023-06-26 DIAGNOSIS — R1031 Right lower quadrant pain: Secondary | ICD-10-CM | POA: Diagnosis not present

## 2023-06-26 DIAGNOSIS — E039 Hypothyroidism, unspecified: Secondary | ICD-10-CM | POA: Diagnosis not present

## 2023-10-31 ENCOUNTER — Ambulatory Visit (INDEPENDENT_AMBULATORY_CARE_PROVIDER_SITE_OTHER): Admitting: Dermatology

## 2023-10-31 ENCOUNTER — Encounter: Payer: Self-pay | Admitting: Dermatology

## 2023-10-31 DIAGNOSIS — D0439 Carcinoma in situ of skin of other parts of face: Secondary | ICD-10-CM

## 2023-10-31 DIAGNOSIS — D485 Neoplasm of uncertain behavior of skin: Secondary | ICD-10-CM

## 2023-10-31 DIAGNOSIS — A630 Anogenital (venereal) warts: Secondary | ICD-10-CM

## 2023-10-31 DIAGNOSIS — D492 Neoplasm of unspecified behavior of bone, soft tissue, and skin: Secondary | ICD-10-CM

## 2023-10-31 DIAGNOSIS — C4492 Squamous cell carcinoma of skin, unspecified: Secondary | ICD-10-CM

## 2023-10-31 HISTORY — DX: Squamous cell carcinoma of skin, unspecified: C44.92

## 2023-10-31 NOTE — Progress Notes (Signed)
   New Patient Visit   Subjective  Juan Wood is a 68 y.o. male who presents for the following: Spots. Groin area and face. Lesion in groin present for ~1 year. Getting larger, painful.  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 Has been using every other day for ~2 weeks.   Lesions on face are pink and scaly. Tender at times. Mother had skin cancer on her face.   The patient has spots, moles and lesions to be evaluated, some may be new or changing and the patient may have concern these could be cancer.    The following portions of the chart were reviewed this encounter and updated as appropriate: medications, allergies, medical history  Review of Systems:  No other skin or systemic complaints except as noted in HPI or Assessment and Plan.  Objective  Well appearing patient in no apparent distress; mood and affect are within normal limits.  A focused examination was performed of the following areas: Face, groin  Relevant physical exam findings are noted in the Assessment and Plan.  Left Malar Cheek 1.1 cm pink scaly plaque with cutaneous horn  suprapubic Verrucous plaque with erythematous eroded edges, likely imiquimod effect  Assessment & Plan   NEOPLASM OF SKIN Left Malar Cheek Skin / nail biopsy Type of biopsy: tangential   Informed consent: discussed and consent obtained   Timeout: patient name, date of birth, surgical site, and procedure verified   Procedure prep:  Patient was prepped and draped in usual sterile fashion Prep type:  Isopropyl alcohol Anesthesia: the lesion was anesthetized in a standard fashion   Anesthetic:  1% lidocaine  w/ epinephrine 1-100,000 buffered w/ 8.4% NaHCO3 Instrument used: DermaBlade   Hemostasis achieved with: pressure and aluminum chloride   Outcome: patient tolerated procedure well   Post-procedure details: sterile dressing applied and wound care instructions given   Dressing type: bandage and petrolatum    Specimen  1 - Surgical pathology Differential Diagnosis: AK vs SCC  Check Margins: No Plan Mohs if + skin cancer. CONDYLOMA ACUMINATA suprapubic Stop Imiquimod for now. Apply Vaseline Jelly or Aquaphor ointment twice a day until area is healed. Plan LN2 treatment then resume Imiquimod at next visit.    Return in about 1 month (around 12/01/2023) for Biopsy Follow Up, Wart Follow UP.  I, Jill Parcell, CMA, am acting as scribe for Boneta Sharps, MD.   Documentation: I have reviewed the above documentation for accuracy and completeness, and I agree with the above.  Boneta Sharps, MD

## 2023-10-31 NOTE — Patient Instructions (Addendum)
 Groin area: Stop Imiquimod for now. Apply Vaseline Jelly or Aquaphor ointment twice a day until area is healed.    Wound Care Instructions  Cleanse wound gently with soap and water once a day then pat dry with clean gauze. Apply a thin coat of Petrolatum (petroleum jelly, Vaseline) over the wound (unless you have an allergy to this). We recommend that you use a new, sterile tube of Vaseline. Do not pick or remove scabs. Do not remove the yellow or white healing tissue from the base of the wound.  Cover the wound with fresh, clean, nonstick gauze and secure with paper tape. You may use Band-Aids in place of gauze and tape if the wound is small enough, but would recommend trimming much of the tape off as there is often too much. Sometimes Band-Aids can irritate the skin.  You should call the office for your biopsy report after 1 week if you have not already been contacted.  If you experience any problems, such as abnormal amounts of bleeding, swelling, significant bruising, significant pain, or evidence of infection, please call the office immediately.  FOR ADULT SURGERY PATIENTS: If you need something for pain relief you may take 1 extra strength Tylenol  (acetaminophen ) AND 2 Ibuprofen  (200mg  each) together every 4 hours as needed for pain. (do not take these if you are allergic to them or if you have a reason you should not take them.) Typically, you may only need pain medication for 1 to 3 days.       Recommend daily broad spectrum sunscreen SPF 30+ to sun-exposed areas, reapply every 2 hours as needed. Call for new or changing lesions.  Staying in the shade or wearing long sleeves, sun glasses (UVA+UVB protection) and wide brim hats (4-inch brim around the entire circumference of the hat) are also recommended for sun protection.     Due to recent changes in healthcare laws, you may see results of your pathology and/or laboratory studies on MyChart before the doctors have had a chance  to review them. We understand that in some cases there may be results that are confusing or concerning to you. Please understand that not all results are received at the same time and often the doctors may need to interpret multiple results in order to provide you with the best plan of care or course of treatment. Therefore, we ask that you please give us  2 business days to thoroughly review all your results before contacting the office for clarification. Should we see a critical lab result, you will be contacted sooner.   If You Need Anything After Your Visit  If you have any questions or concerns for your doctor, please call our main line at [phone] and press option 4 to reach your doctor's medical assistant. If no one answers, please leave a voicemail as directed and we will return your call as soon as possible. Messages left after 4 pm will be answered the following business day.   You may also send us  a message via MyChart. We typically respond to MyChart messages within 1-2 business days.  For prescription refills, please ask your pharmacy to contact our office. Our fax number is [phone].  If you have an urgent issue when the clinic is closed that cannot wait until the next business day, you can page your doctor at the number below.    Please note that while we do our best to be available for urgent issues outside of office hours, we are not available  24/7.   If you have an urgent issue and are unable to reach us , you may choose to seek medical care at your doctor's office, retail clinic, urgent care center, or emergency room.  If you have a medical emergency, please immediately call 911 or go to the emergency department.  Pager Numbers  - Dr. Hester: [phone]  - Dr. Jackquline: [phone]  - Dr. Claudene: [phone]   - Dr. Raymund: [phone]  In the event of inclement weather, please call our main line at [phone] for an update on the status of any delays or  closures.  Dermatology Medication Tips: Please keep the boxes that topical medications come in in order to help keep track of the instructions about where and how to use these. Pharmacies typically print the medication instructions only on the boxes and not directly on the medication tubes.   If your medication is too expensive, please contact our office at [phone] option 4 or send us  a message through MyChart.   We are unable to tell what your co-pay for medications will be in advance as this is different depending on your insurance coverage. However, we may be able to find a substitute medication at lower cost or fill out paperwork to get insurance to cover a needed medication.   If a prior authorization is required to get your medication covered by your insurance company, please allow us  1-2 business days to complete this process.  Drug prices often vary depending on where the prescription is filled and some pharmacies may offer cheaper prices.  The website www.goodrx.com contains coupons for medications through different pharmacies. The prices here do not account for what the cost may be with help from insurance (it may be cheaper with your insurance), but the website can give you the price if you did not use any insurance.  - You can print the associated coupon and take it with your prescription to the pharmacy.  - You may also stop by our office during regular business hours and pick up a GoodRx coupon card.  - If you need your prescription sent electronically to a different pharmacy, notify our office through [facility] or by phone at [phone] option 4.     Si Usted Necesita Algo Despus de Su Visita  Tambin puede enviarnos un mensaje a travs de Clinical cytogeneticist. Por lo general respondemos a los mensajes de MyChart en el transcurso de 1 a 2 das hbiles.  Para renovar recetas, por favor pida a su farmacia que se ponga en contacto con nuestra oficina. Randi lakes de fax  es [place] [phone].  Si tiene un asunto urgente cuando la clnica est cerrada y que no puede esperar hasta el siguiente da hbil, puede llamar/localizar a su doctor(a) al nmero que aparece a continuacin.   Por favor, tenga en cuenta que aunque hacemos todo lo posible para estar disponibles para asuntos urgentes fuera del horario de [place], no estamos disponibles las 24 horas del da, los 7 [address]  [address] de la [place].   Si tiene un problema urgente y no puede comunicarse con nosotros, puede optar por buscar atencin mdica  en el consultorio de su doctor(a), en una clnica privada, en un centro de atencin urgente o en una sala de emergencias.  Si tiene Engineer, drilling, por favor llame inmediatamente al 911 o vaya a la sala de emergencias.  Nmeros de bper  - Dr. Hester: [phone]  - Dra. Jackquline: [phone]  - Dr. Claudene: [phone]  - Dra. Kitts: [phone]  En caso de inclemencias del [place], por favor llame a nuestra lnea principal al [phone] para una actualizacin sobre el [place] de cualquier retraso o cierre.  Consejos para la medicacin en dermatologa: Por favor, guarde las cajas en las que vienen los medicamentos de uso tpico para ayudarle a seguir las instrucciones sobre dnde y cmo usarlos. Las farmacias generalmente imprimen las instrucciones del medicamento slo en las cajas y no directamente en los tubos del [place].   Si su medicamento es muy caro, por favor, pngase en contacto con landry rieger llamando al [phone] y presione la opcin 4 o envenos un mensaje a travs de Clinical cytogeneticist.   No podemos decirle cul ser su copago por los medicamentos por adelantado ya que esto es diferente dependiendo de la cobertura de su seguro. Sin embargo, es posible que podamos encontrar un medicamento sustituto a Audiological scientist un formulario para que el seguro cubra el medicamento que se considera necesario.   Si se requiere una autorizacin previa para  que su compaa de seguros malta su medicamento, por favor permtanos de 1 a 2 das hbiles para completar este proceso.  Los precios de los medicamentos varan con frecuencia dependiendo del Environmental consultant de dnde se surte la receta y alguna farmacias pueden ofrecer precios ms baratos.  El sitio web www.goodrx.com tiene cupones para medicamentos de Health and safety inspector. Los precios aqu no tienen en cuenta lo que podra costar con la ayuda del seguro (puede ser ms barato con su seguro), pero el sitio web puede darle el precio si no utiliz Tourist information centre manager.  - Puede imprimir el cupn correspondiente y llevarlo con su receta a la farmacia.  - Tambin puede pasar por nuestra oficina durante el horario de atencin regular y Education officer, museum una tarjeta de cupones de GoodRx.  - Si necesita que su receta se enve electrnicamente a una farmacia diferente, informe a nuestra oficina a travs de MyChart de [place] o por telfono llamando al [phone] y presione la opcin 4.

## 2023-11-05 LAB — SURGICAL PATHOLOGY

## 2023-11-06 ENCOUNTER — Ambulatory Visit: Payer: Self-pay | Admitting: Dermatology

## 2023-11-06 DIAGNOSIS — C4492 Squamous cell carcinoma of skin, unspecified: Secondary | ICD-10-CM

## 2023-11-07 ENCOUNTER — Encounter: Payer: Self-pay | Admitting: Dermatology

## 2023-11-07 MED ORDER — FLUOROURACIL 5 % EX CREA: TOPICAL_CREAM | CUTANEOUS | 0 refills | Status: AC

## 2023-11-07 NOTE — Telephone Encounter (Signed)
 Patient advised of BX results and would like treatment with 5FU/Calcipotriene Cream.   RX sent in and patient scheduled for another appt for TBSE. aw

## 2023-11-20 NOTE — Telephone Encounter (Signed)
 Patient called today and does not want to use cream to BX site. He has opted to have [facility] surgery instead. Advised patient he will be referred to Dr. Corey in [place]. aw

## 2023-12-03 ENCOUNTER — Encounter: Payer: Self-pay | Admitting: Dermatology

## 2023-12-03 ENCOUNTER — Ambulatory Visit (INDEPENDENT_AMBULATORY_CARE_PROVIDER_SITE_OTHER): Admitting: Dermatology

## 2023-12-03 DIAGNOSIS — D099 Carcinoma in situ, unspecified: Secondary | ICD-10-CM

## 2023-12-03 DIAGNOSIS — L578 Other skin changes due to chronic exposure to nonionizing radiation: Secondary | ICD-10-CM | POA: Diagnosis not present

## 2023-12-03 DIAGNOSIS — L814 Other melanin hyperpigmentation: Secondary | ICD-10-CM

## 2023-12-03 DIAGNOSIS — L821 Other seborrheic keratosis: Secondary | ICD-10-CM

## 2023-12-03 DIAGNOSIS — L570 Actinic keratosis: Secondary | ICD-10-CM

## 2023-12-03 DIAGNOSIS — W908XXA Exposure to other nonionizing radiation, initial encounter: Secondary | ICD-10-CM

## 2023-12-03 NOTE — Patient Instructions (Signed)

## 2023-12-03 NOTE — Progress Notes (Addendum)
   Follow-Up Visit   Subjective  Juan Wood is a 68 y.o. male who presents for the following: biopsy follow up at left malar cheek. Patient is scheduled for Mohs with Dr. 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 Has used imiquimod in the past. Patient would like arms checked.    The following portions of the chart were reviewed this encounter and updated as appropriate: medications, allergies, medical history  Review of Systems:  No other skin or systemic complaints except as noted in HPI or Assessment and Plan.  Objective  Well appearing patient in no apparent distress; mood and affect are within normal limits.   A focused examination was performed of the following areas: Arms, face, axilla  Relevant exam findings are noted in the Assessment and Plan.  left malar cheek Pink bx site right upper arm Erythematous thin papules/macules with gritty scale.   Assessment & Plan   SEBORRHEIC KERATOSIS - Stuck-on, waxy, tan-brown papules and/or plaques L axilla - Benign-appearing - Discussed benign etiology and prognosis. - Observe - Call for any changes  LENTIGINES Exam: scattered tan macules Due to sun exposure Treatment Plan: Benign-appearing, observe. Recommend daily broad spectrum sunscreen SPF 30+ to sun-exposed areas, reapply every 2 hours as needed.  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 Corey 12/17/23 AK (ACTINIC KERATOSIS) right upper arm Actinic keratoses are precancerous spots that appear secondary to cumulative UV radiation exposure/sun exposure over time. They are chronic with expected duration over 1 year. A portion of actinic keratoses will progress to squamous cell carcinoma of the skin. It is not possible to reliably predict which spots will progress to skin cancer and so treatment is recommended to prevent development of skin cancer.  Recommend daily  broad spectrum sunscreen SPF 30+ to sun-exposed areas, reapply every 2 hours as needed.  Recommend staying in the shade or wearing long sleeves, sun glasses (UVA+UVB protection) and wide brim hats (4-inch brim around the entire circumference of the hat). Call for new or changing lesions. Destruction of lesion - right upper arm Complexity: simple   Destruction method: cryotherapy   Informed consent: discussed and consent obtained   Timeout:  patient name, date of birth, surgical site, and procedure verified Lesion destroyed using liquid nitrogen: Yes   Region frozen until ice ball extended beyond lesion: Yes   Cryo cycles: 1 or 2. Outcome: patient tolerated procedure well with no complications   Post-procedure details: wound care instructions given    SEBORRHEIC KERATOSES   ACTINIC ELASTOSIS   LENTIGINES    Return for TBSE, with Dr. Claudene, as scheduled, HxSCCis.  LILLETTE Lonell Drones, RMA, am acting as scribe for Boneta Claudene, MD .   Documentation: I have reviewed the above documentation for accuracy and completeness, and I agree with the above.  Boneta Claudene, MD

## 2023-12-10 ENCOUNTER — Encounter: Payer: Self-pay | Admitting: Dermatology

## 2023-12-17 ENCOUNTER — Ambulatory Visit (INDEPENDENT_AMBULATORY_CARE_PROVIDER_SITE_OTHER): Admitting: Dermatology

## 2023-12-17 ENCOUNTER — Encounter: Payer: Self-pay | Admitting: Dermatology

## 2023-12-17 VITALS — BP 138/85 | HR 60 | Temp 97.9°F

## 2023-12-17 DIAGNOSIS — D0439 Carcinoma in situ of skin of other parts of face: Secondary | ICD-10-CM | POA: Diagnosis not present

## 2023-12-17 DIAGNOSIS — L578 Other skin changes due to chronic exposure to nonionizing radiation: Secondary | ICD-10-CM

## 2023-12-17 DIAGNOSIS — L814 Other melanin hyperpigmentation: Secondary | ICD-10-CM

## 2023-12-17 DIAGNOSIS — C4492 Squamous cell carcinoma of skin, unspecified: Secondary | ICD-10-CM

## 2023-12-17 MED ORDER — OXYCODONE HCL 5 MG PO TABS: 5.0000 mg | ORAL_TABLET | Freq: Four times a day (QID) | ORAL | 0 refills | Status: AC | PRN

## 2023-12-17 NOTE — Progress Notes (Signed)
 Follow-Up Visit   Subjective  Juan Wood is a 68 y.o. 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 Claudene.  The following portions of the chart were reviewed this encounter and updated as appropriate: medications, allergies, medical history  Review of Systems:  No other skin or systemic complaints except as noted in HPI or Assessment and Plan.  Objective  Well appearing patient in no apparent distress; mood and affect are within normal limits.  A focused examination was performed of the following areas: Left malar cheek Relevant physical exam findings are noted in the Assessment and Plan.   Left Malar Cheek Hyperkeratotic plaque   Assessment & Plan   SQUAMOUS CELL CARCINOMA OF SKIN Left Malar Cheek Mohs surgery  Consent obtained: written  Anticoagulation: Was the anticoagulation regimen changed prior to Mohs? No    Anesthesia: Anesthesia method: local infiltration Local anesthetic: lidocaine  1% WITH epi  Procedure Details: Timeout: pre-procedure verification complete Procedure Prep: patient was prepped and draped in usual sterile fashion Prep type: chlorhexidine Biopsy accession number: [id number] Pre-Op diagnosis: squamous cell carcinoma SCC subtype: in situ MohsAIQ Surgical site (if tumor spans multiple areas, please select predominant area): cheek (including jawline) Surgery side: left Surgical site (from skin exam): Left Malar Cheek Pre-operative length (cm): 1.8 Pre-operative width (cm): 1 Indications for Mohs surgery: anatomic location where tissue conservation is critical  Micrographic Surgery Details: Post-operative length (cm): 2.7 Post-operative width (cm): 2.1 Number of Mohs stages: 2 Post surgery depth of defect: subcutaneous fat and skeletal muscle  Stage 1    Tumor features identified on Mohs section: squamous cell carcinoma  Stage 2    Tumor features identified on  Mohs section: no tumor identified  Reconstruction: Was the defect reconstructed? Yes   Was reconstruction performed by the same Mohs surgeon? Yes   Setting of reconstruction: outpatient office When was reconstruction performed? same day Type of reconstruction: flap Type of flap: advancement   Advancement flap type: unilateral single arm  Opioids: Did the patient receive a prescription for opioid/narcotic related to Mohs surgery? Yes    Related Medications oxyCODONE  (OXY IR/ROXICODONE ) 5 MG immediate release tablet Take 1 tablet (5 mg total) by mouth every 6 (six) hours as needed for up to 8 doses.   Return in about 1 week (around 12/24/2023) for wound check/suture removal.  I, Darice Smock, CMA, am acting as scribe for RUFUS CHRISTELLA HOLY, MD.    12/17/2023  HISTORY OF PRESENT ILLNESS  Juan Wood is seen in consultation at the request of Dr. 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 They note that the area has been present for about 6 months increasing in size with time.  There is no history of previous treatment.  Reports no other new or changing lesions and has no other complaints today.  Medications and allergies: see patient chart.  Review of systems: Reviewed 8 systems and notable for the above skin cancer.  All other systems reviewed are unremarkable/negative, unless noted in the HPI. Past medical history, surgical history, family history, social history were also reviewed and are noted in the chart/questionnaire.    PHYSICAL EXAMINATION  General: Well-appearing, in no acute distress, alert and oriented x 4. Vitals reviewed in chart (if available).   Skin: Exam reveals a 1.8 x 1.0 cm erythematous papule and biopsy scar on the left malar cheek. There are rhytids, telangiectasias, and lentigines, consistent  with photodamage.   Biopsy report(s) reviewed, confirming the diagnosis.   ASSESSMENT  1) Squamous Carcinoma in Situ of the left  malar cheek 2) photodamage 3) solar lentigines   PLAN   1. Due to location, size, histology, or recurrence and the likelihood of subclinical extension as well as the need to conserve normal surrounding tissue, the patient was deemed acceptable for Mohs micrographic surgery (MMS).  The nature and purpose of the procedure, associated benefits and risks including recurrence and scarring, possible complications such as pain, infection, and bleeding, and alternative methods of treatment if appropriate were discussed with the patient during consent. The lesion location was verified by the patient, by reviewing previous notes, pathology reports, and by photographs as well as angulation measurements if available.  Informed consent was reviewed and signed by the patient, and timeout was performed at 8:30 AM. See op note below.  2. For the photodamage and solar lentigines, sun protection discussed/information given on OTC sunscreens, and we recommend continued regular follow-up with primary dermatologist every 6 months or sooner for any growing, bleeding, or changing lesions. 3. Prognosis and future surveillance discussed. 4. Letter with treatment outcome sent to referring provider. 5. Pain acetaminophen /ibuprofen /oxycodone  5 mg  MOHS MICROGRAPHIC SURGERY AND RECONSTRUCTION  Initial size:   1.8 x 1.0 cm Surgical defect/wound size: 2.7 x 2.1 cm Anesthesia:    0.33% lidocaine  with 1:200,000 epinephrine EBL:    <5 mL Complications:  None Repair type:   Adjacent Tissue Transfer (Advancement Flap) SQ suture:   5-0 Monocryl Cutaneous suture:  5-0 Polyprolene Final size of the repair: 3.0 x 5.0 = 15 cm^2  Stages:  2  STAGE I: Anesthesia achieved with 0.5% lidocaine  with 1:200,000 epinephrine. ChloraPrep applied. 1 section(s) excised using Mohs technique (this includes total peripheral and deep tissue margin excision and evaluation with frozen sections, excised and interpreted by the same physician). The  tumor was first debulked and then excised with an approx. 2 mm margin.  Hemostasis was achieved with electrocautery as needed.  The specimen was then oriented, subdivided/relaxed, inked, and processed using Mohs technique.    Frozen section analysis revealed a positive margin for full thickness epidermal architectural and cellular atypia, apoptotic cells, individual cell dyskeratosis with markedly altered maturation but usually still some surface keratinization and intercellular bridges present with marked nuclear atypia, including nuclear hyperchromasia and multinucleation in the peripheral margin.    STAGE II: An additional 2 mm margin was excised.  Hemostasis was achieved with electrocautery as needed.  The specimen was then oriented, subdivided/relaxed, inked, and processed using Mohs technique. Evaluation of slides by the Mohs surgeon revealed clear tumor margins.   Reconstruction  PROCEDURE: Advancement Flap The nature of the procedure was discussed with the patient in detail, including alternatives.  The risks discussed included but not limited to potential for infection, bleeding, scar formation, and damage to underlying structures.  The patient understood the risks and signed the consent form (scanned into chart).  This wound was reconstructed with an advancement flap.  Local anesthesia was achieved with the anesthetic indicated above.  The operative site was prepped with a surgical antiseptic solution, and then draped with sterile towels to insure a sterile field.  The beveled edges of the wound were excised to 90 degrees relative to the surface skin plane.  The wound was undermined in all directions, and meticulous hemostasis was achieved with an electrosurgical device.  Relaxing incisions were made, if necessary, for lateral tension release.  The tissue was advanced  and closed centrally, and redundant tissue was trimmed as necessary.  The wound was sutured in a layered fashion to close  potential dead space and to precisely and securely approximate the wound edges.  The dimensions of the flap were:  5.0 cm x 3.0 cm for a total flap surface area of 15 centimeters squared (cm2).    The wound was covered with petrolatum and a dressing.  The patient understands the need to return immediately for any signs of infection to include swelling, pain, purulent discharge, localized warmth, or fever.  Contact information was provided to the patient (including after-hours pager numbers)     Documentation: I have reviewed the above documentation for accuracy and completeness, and I agree with the above.  RUFUS CHRISTELLA HOLY, MD

## 2023-12-17 NOTE — Patient Instructions (Signed)

## 2023-12-25 ENCOUNTER — Ambulatory Visit: Admitting: Dermatology

## 2023-12-25 VITALS — BP 134/76 | HR 63

## 2023-12-25 DIAGNOSIS — C4492 Squamous cell carcinoma of skin, unspecified: Secondary | ICD-10-CM

## 2023-12-25 DIAGNOSIS — T1490XD Injury, unspecified, subsequent encounter: Secondary | ICD-10-CM

## 2023-12-25 NOTE — Progress Notes (Unsigned)
   Follow Up Visit   Subjective  Juan Wood is a 68 y.o. male who presents for the following: follow up from Mohs surgery   The patient presents for follow up from Mohs surgery for a SCC on the left malar cheek, treated on 12/17/2023, repaired with an advancement flap. The patient has been bandaging the wound as directed. The endorse the following concerns: none.   The following portions of the chart were reviewed this encounter and updated as appropriate: medications, allergies, medical history  Review of Systems:  No other skin or systemic complaints except as noted in HPI or Assessment and Plan.  Objective  Well appearing patient in no apparent distress; mood and affect are within normal limits.  A focal examination was performed including the left cheek. All findings within normal limits unless otherwise noted below.  Healing wound with mild erythema     Relevant physical exam findings are noted in the Assessment and Plan.    Assessment & Plan   Healing Wound s/p Mohs for SCC on the left malar cheek, treated on 12/17/2023, repaired with an advancement flap - Reassured that wound is healing well - No evidence of infection - No swelling, induration, purulence, dehiscence, or tenderness out of proportion to the clinical exam, see photo above - Discussed that scars take up to 12 months to mature from the date of surgery - Recommend SPF 30+ to scar daily to prevent purple color from UV exposure during scar maturation process - Discussed that erythema and raised appearance of scar will fade over the next 4-6 months - OK to start scar massage at 4-6 weeks post-op - Can consider silicone based products for scar healing starting at 6 weeks post-op - Ok to continue ointment daily to wound under a bandage for another week  HISTORY OF SQUAMOUS CELL CARCINOMA OF THE SKIN - No evidence of recurrence today - Recommend regular full body skin exams - Recommend daily broad spectrum  sunscreen SPF 30+ to sun-exposed areas, reapply every 2 hours as needed.  - Call if any new or changing lesions are noted between office visits  Return in 3 weeks (on 01/15/2024) for mohs follow up .  LILLETTE Rollene Gobble, RN, am acting as scribe for RUFUS CHRISTELLA HOLY, MD .   Documentation: I have reviewed the above documentation for accuracy and completeness, and I agree with the above.  RUFUS CHRISTELLA HOLY, MD

## 2023-12-25 NOTE — Patient Instructions (Signed)

## 2023-12-26 ENCOUNTER — Encounter: Payer: Self-pay | Admitting: Dermatology

## 2023-12-30 ENCOUNTER — Encounter: Payer: Self-pay | Admitting: Dermatology

## 2023-12-31 ENCOUNTER — Encounter: Payer: Self-pay | Admitting: Dermatology

## 2023-12-31 ENCOUNTER — Ambulatory Visit (INDEPENDENT_AMBULATORY_CARE_PROVIDER_SITE_OTHER): Admitting: Dermatology

## 2023-12-31 DIAGNOSIS — D492 Neoplasm of unspecified behavior of bone, soft tissue, and skin: Secondary | ICD-10-CM

## 2023-12-31 DIAGNOSIS — A630 Anogenital (venereal) warts: Secondary | ICD-10-CM

## 2023-12-31 DIAGNOSIS — L578 Other skin changes due to chronic exposure to nonionizing radiation: Secondary | ICD-10-CM

## 2023-12-31 DIAGNOSIS — D229 Melanocytic nevi, unspecified: Secondary | ICD-10-CM

## 2023-12-31 DIAGNOSIS — Z1283 Encounter for screening for malignant neoplasm of skin: Secondary | ICD-10-CM

## 2023-12-31 DIAGNOSIS — L814 Other melanin hyperpigmentation: Secondary | ICD-10-CM

## 2023-12-31 DIAGNOSIS — R590 Localized enlarged lymph nodes: Secondary | ICD-10-CM

## 2023-12-31 DIAGNOSIS — L853 Xerosis cutis: Secondary | ICD-10-CM

## 2023-12-31 DIAGNOSIS — W908XXA Exposure to other nonionizing radiation, initial encounter: Secondary | ICD-10-CM

## 2023-12-31 DIAGNOSIS — L821 Other seborrheic keratosis: Secondary | ICD-10-CM

## 2023-12-31 DIAGNOSIS — Z86007 Personal history of in-situ neoplasm of skin: Secondary | ICD-10-CM

## 2023-12-31 DIAGNOSIS — D1801 Hemangioma of skin and subcutaneous tissue: Secondary | ICD-10-CM

## 2023-12-31 DIAGNOSIS — L720 Epidermal cyst: Secondary | ICD-10-CM

## 2023-12-31 DIAGNOSIS — R591 Generalized enlarged lymph nodes: Secondary | ICD-10-CM

## 2023-12-31 NOTE — Progress Notes (Signed)
 Follow-Up Visit   Subjective  Juan Wood is a 68 y.o. male who presents for the following: Skin Cancer Screening and Full Body Skin Exam. HxSCCIS treated with Mohs. Places of concern at left jaw.   The patient presents for Total-Body Skin Exam (TBSE) for skin cancer screening and mole check. The patient has spots, moles and lesions to be evaluated, some may be new or changing and the patient may have concern these could be cancer.   The following portions of the chart were reviewed this encounter and updated as appropriate: medications, allergies, medical history  Review of Systems:  No other skin or systemic complaints except as noted in HPI or Assessment and Plan.  Objective  Well appearing patient in no apparent distress; mood and affect are within normal limits.  A full examination was performed including scalp, head, eyes, ears, nose, lips, neck, chest, axillae, abdomen, back, buttocks, bilateral upper extremities, bilateral lower extremities, hands, feet, fingers, toes, fingernails, and toenails. All findings within normal limits unless otherwise noted below.   Relevant physical exam findings are noted in the Assessment and Plan.  suprapubic Verrucous plaque with erythematous eroded edges, likely imiquimod effect Left Upper Back 7 mm irregular pigmented macule with focal pigment globules   Assessment & Plan   SKIN CANCER SCREENING PERFORMED TODAY.  ACTINIC DAMAGE - Chronic condition, secondary to cumulative UV/sun exposure - diffuse scaly erythematous macules with underlying dyspigmentation - Recommend daily broad spectrum sunscreen SPF 30+ to sun-exposed areas, reapply every 2 hours as needed.  - Staying in the shade or wearing long sleeves, sun glasses (UVA+UVB protection) and wide brim hats (4-inch brim around the entire circumference of the hat) are also recommended for sun protection.  - Call for new or changing lesions.  LENTIGINES, SEBORRHEIC KERATOSES,  HEMANGIOMAS - Benign normal skin lesions - Benign-appearing - Call for any changes  MELANOCYTIC NEVI - Tan-brown and/or pink-flesh-colored symmetric macules and papules - Benign appearing on exam today - Observation - Call clinic for new or changing moles - Recommend daily use of broad spectrum spf 30+ sunscreen to sun-exposed areas.   HISTORY OF SQUAMOUS CELL CARCINOMA IN SITU OF THE SKIN Left malar cheek- Mohs 12/17/2023 - No evidence of recurrence today - Recommend regular full body skin exams - Recommend daily broad spectrum sunscreen SPF 30+ to sun-exposed areas, reapply every 2 hours as needed.  - Call if any new or changing lesions are noted between office visits  Left Submental Lymphadenopathy  Exam: subQ mobile nodule  Treatment Plan: Recommend making an appointment with dentist, patient advises he will reach out to them today for an appointment. If dentist doesn't find cause of LAD, patient will contact us  and we will order an ultrasound for further imaging.   EPIDERMAL INCLUSION CYST Exam: Subcutaneous nodule at L chin  Benign-appearing. Exam most consistent with an epidermal inclusion cyst. Discussed that a cyst is a benign growth that can grow over time and sometimes get irritated or inflamed. Recommend observation if it is not bothersome. Discussed option of surgical excision to remove it if it is growing, symptomatic, or other changes noted. Please call for new or changing lesions so they can be evaluated.  Xerosis - diffuse xerotic patches - recommend gentle, hydrating skin care - gentle skin care handout given  CONDYLOMA ACUMINATA suprapubic Patient deferred exam, reports has resolved.  NEOPLASM OF SKIN Left Upper Back Skin / nail biopsy Type of biopsy: tangential   Informed consent: discussed and consent obtained  Timeout: patient name, date of birth, surgical site, and procedure verified   Procedure prep:  Patient was prepped and draped in usual sterile  fashion Prep type:  Isopropyl alcohol Anesthesia: the lesion was anesthetized in a standard fashion   Anesthetic:  1% lidocaine  w/ epinephrine 1-100,000 buffered w/ 8.4% NaHCO3 Instrument used: DermaBlade   Hemostasis achieved with: pressure and aluminum chloride   Outcome: patient tolerated procedure well   Post-procedure details: sterile dressing applied and wound care instructions given   Dressing type: bandage and petrolatum    Specimen 1 - Surgical pathology Differential Diagnosis: lentigo vs lentigo maligna vs SK  Check Margins: No 7 mm irregular pigmented macule with focal pigment globules MULTIPLE BENIGN NEVI   LENTIGINES   ACTINIC ELASTOSIS   SEBORRHEIC KERATOSES   CHERRY ANGIOMA   EIC (EPIDERMAL INCLUSION CYST)   LYMPHADENOPATHY    Return in about 6 months (around 06/30/2024) for TBSE, w/ Dr. Claudene, HxSCCIS.  I, Jacquelynn V. Wilfred, CMA, am acting as scribe for Boneta Claudene, MD.   Documentation: I have reviewed the above documentation for accuracy and completeness, and I agree with the above.  Boneta Claudene, MD

## 2023-12-31 NOTE — Patient Instructions (Addendum)
 Wound Care Instructions  Cleanse wound gently with soap and water once a day then pat dry with clean gauze. Apply a thin coat of Petrolatum (petroleum jelly, "Vaseline") over the wound (unless you have an allergy to this). We recommend that you use a new, sterile tube of Vaseline. Do not pick or remove scabs. Do not remove the yellow or white "healing tissue" from the base of the wound.  Cover the wound with fresh, clean, nonstick gauze and secure with paper tape. You may use Band-Aids in place of gauze and tape if the wound is small enough, but would recommend trimming much of the tape off as there is often too much. Sometimes Band-Aids can irritate the skin.  You should call the office for your biopsy report after 1 week if you have not already been contacted.  If you experience any problems, such as abnormal amounts of bleeding, swelling, significant bruising, significant pain, or evidence of infection, please call the office immediately.  FOR ADULT SURGERY PATIENTS: If you need something for pain relief you may take 1 extra strength Tylenol (acetaminophen) AND 2 Ibuprofen (200mg  each) together every 4 hours as needed for pain. (do not take these if you are allergic to them or if you have a reason you should not take them.) Typically, you may only need pain medication for 1 to 3 days.     Melanoma ABCDEs  Melanoma is the most dangerous type of skin cancer, and is the leading cause of death from skin disease.  You are more likely to develop melanoma if you: Have light-colored skin, light-colored eyes, or red or blond hair Spend a lot of time in the sun Tan regularly, either outdoors or in a tanning bed Have had blistering sunburns, especially during childhood Have a close family member who has had a melanoma Have atypical moles or large birthmarks  Early detection of melanoma is key since treatment is typically straightforward and cure rates are extremely high if we catch it early.   The  first sign of melanoma is often a change in a mole or a new dark spot.  The ABCDE system is a way of remembering the signs of melanoma.  A for asymmetry:  The two halves do not match. B for border:  The edges of the growth are irregular. C for color:  A mixture of colors are present instead of an even brown color. D for diameter:  Melanomas are usually (but not always) greater than 6mm - the size of a pencil eraser. E for evolution:  The spot keeps changing in size, shape, and color.  Please check your skin once per month between visits. You can use a small mirror in front and a large mirror behind you to keep an eye on the back side or your body.   If you see any new or changing lesions before your next follow-up, please call to schedule a visit.  Please continue daily skin protection including broad spectrum sunscreen SPF 30+ to sun-exposed areas, reapplying every 2 hours as needed when you're outdoors.   Staying in the shade or wearing long sleeves, sun glasses (UVA+UVB protection) and wide brim hats (4-inch brim around the entire circumference of the hat) are also recommended for sun protection.    Due to recent changes in healthcare laws, you may see results of your pathology and/or laboratory studies on MyChart before the doctors have had a chance to review them. We understand that in some cases there may be  results that are confusing or concerning to you. Please understand that not all results are received at the same time and often the doctors may need to interpret multiple results in order to provide you with the best plan of care or course of treatment. Therefore, we ask that you please give [place] 2 business days to thoroughly review all your results before contacting the office for clarification. Should we see a critical lab result, you will be contacted sooner.   If You Need Anything After Your Visit  If you have any questions or concerns for your doctor, please call our main line at  [phone] and press option 4 to reach your doctor's medical assistant. If no one answers, please leave a voicemail as directed and we will return your call as soon as possible. Messages left after 4 pm will be answered the following business day.   You may also send [place] a message via MyChart. We typically respond to MyChart messages within 1-2 business days.  For prescription refills, please ask your pharmacy to contact our office. Our fax number is [phone].  If you have an urgent issue when the clinic is closed that cannot wait until the next business day, you can page your doctor at the number below.    Please note that while we do our best to be available for urgent issues outside of office hours, we are not available 24/7.   If you have an urgent issue and are unable to reach [place], you may choose to seek medical care at your doctor's office, retail clinic, urgent care center, or emergency room.  If you have a medical emergency, please immediately call 911 or go to the emergency department.  Pager Numbers  - Dr. Gwen Pounds: [phone]  - Dr. Roseanne Reno: [phone]  - Dr. Katrinka Blazing: [phone]   In the event of inclement weather, please call our main line at [phone] for an update on the status of any delays or closures.  Dermatology Medication Tips: Please keep the boxes that topical medications come in in order to help keep track of the instructions about where and how to use these. Pharmacies typically print the medication instructions only on the boxes and not directly on the medication tubes.   If your medication is too expensive, please contact our office at [phone] option 4 or send [place] a message through MyChart.   We are unable to tell what your co-pay for medications will be in advance as this is different depending on your insurance coverage. However, we may be able to find a substitute medication at lower cost or fill out paperwork to get insurance to cover a needed  medication.   If a prior authorization is required to get your medication covered by your insurance company, please allow [place] 1-2 business days to complete this process.  Drug prices often vary depending on where the prescription is filled and some pharmacies may offer cheaper prices.  The website www.goodrx.com contains coupons for medications through different pharmacies. The prices here do not account for what the cost may be with help from insurance (it may be cheaper with your insurance), but the website can give you the price if you did not use any insurance.  - You can print the associated coupon and take it with your prescription to the pharmacy.  - You may also stop by our office during regular business hours and pick up a GoodRx coupon card.  - If you need your prescription sent electronically to a  different pharmacy, notify our office through [employer] or by phone at [phone] option 4.     Si Usted Necesita Algo Despus de Su Visita  Tambin puede enviarnos un mensaje a travs de Clinical cytogeneticist. Por lo general respondemos a los mensajes de MyChart en el transcurso de 1 a 2 das hbiles.  Para renovar recetas, por favor pida a su farmacia que se ponga en contacto con nuestra oficina. Annie Sable de fax es [place] [phone].  Si tiene un asunto urgente cuando la clnica est cerrada y que no puede esperar hasta el siguiente da hbil, puede llamar/localizar a su doctor(a) al nmero que aparece a continuacin.   Por favor, tenga en cuenta que aunque hacemos todo lo posible para estar disponibles para asuntos urgentes fuera del horario de [place], no estamos disponibles las 24 horas del da, los 7 [address]  [address] de la [place].   Si tiene un problema urgente y no puede comunicarse con nosotros, puede optar por buscar atencin mdica  en el consultorio de su doctor(a), en una clnica privada, en un centro de atencin urgente o en una sala de emergencias.  Si tiene Engineer, drilling,  por favor llame inmediatamente al 911 o vaya a la sala de emergencias.  Nmeros de bper  - Dr. Gwen Pounds: [phone]  - Dra. Roseanne Reno: [phone]  - Dr. Katrinka Blazing: [phone]   En caso de inclemencias del tiempo, por favor llame a Lacy Duverney principal al [phone] para una actualizacin sobre el [place] de cualquier retraso o cierre.  Consejos para la medicacin en dermatologa: Por favor, guarde las cajas en las que vienen los medicamentos de uso tpico para ayudarle a seguir las instrucciones sobre dnde y cmo usarlos. Las farmacias generalmente imprimen las instrucciones del medicamento slo en las cajas y no directamente en los tubos del [place].   Si su medicamento es muy caro, por favor, pngase en contacto con Rolm Gala llamando al [phone] y presione la opcin 4 o envenos un mensaje a travs de Clinical cytogeneticist.   No podemos decirle cul ser su copago por los medicamentos por adelantado ya que esto es diferente dependiendo de la cobertura de su seguro. Sin embargo, es posible que podamos encontrar un medicamento sustituto a Audiological scientist un formulario para que el seguro cubra el medicamento que se considera necesario.   Si se requiere una autorizacin previa para que su compaa de seguros [place] su medicamento, por favor permtanos de 1 a 2 das hbiles para completar [address]  Los precios de los medicamentos varan con frecuencia dependiendo del Environmental consultant de dnde se surte la receta y alguna farmacias pueden ofrecer precios ms baratos.  El sitio web www.goodrx.com tiene cupones para medicamentos de Health and safety inspector. Los precios aqu no tienen en cuenta lo que podra costar con la ayuda del seguro (puede ser ms barato con su seguro), pero el sitio web puede darle el precio si no utiliz Tourist information centre manager.  - Puede imprimir el cupn correspondiente y llevarlo con su receta a la farmacia.  - Tambin puede pasar por nuestra oficina durante el horario de atencin  regular y Education officer, museum una tarjeta de cupones de GoodRx.  - Si necesita que su receta se enve electrnicamente a una farmacia diferente, informe a nuestra oficina a travs de MyChart de [place] o por telfono llamando al [phone] y presione la opcin 4.

## 2024-01-02 ENCOUNTER — Ambulatory Visit: Payer: Self-pay | Admitting: Dermatology

## 2024-01-02 LAB — SURGICAL PATHOLOGY

## 2024-01-02 NOTE — Telephone Encounter (Signed)
-----   Message from [place] sent at 01/02/2024  3:47 PM EDT ----- Diagnosis: left upper back :       SOLAR LENTIGO   Plan: none, sent mychart message ----- Message ----- From: Interface, Lab In [place] Sent: 01/02/2024   2:47 PM EDT To: Boneta Sharps, MD

## 2024-01-06 NOTE — Telephone Encounter (Signed)
-----   Message from [place] sent at 01/02/2024  3:47 PM EDT ----- Diagnosis: left upper back :       SOLAR LENTIGO   Plan: none, sent mychart message ----- Message ----- From: Interface, Lab In [place] Sent: 01/02/2024   2:47 PM EDT To: Boneta Sharps, MD

## 2024-01-06 NOTE — Telephone Encounter (Signed)
 Discussed biopsy results with patient

## 2024-01-20 ENCOUNTER — Ambulatory Visit: Admitting: Dermatology

## 2024-01-28 ENCOUNTER — Encounter: Payer: Self-pay | Admitting: Dermatology

## 2024-01-28 ENCOUNTER — Ambulatory Visit (INDEPENDENT_AMBULATORY_CARE_PROVIDER_SITE_OTHER): Admitting: Dermatology

## 2024-01-28 VITALS — BP 136/86 | HR 65

## 2024-01-28 DIAGNOSIS — L539 Erythematous condition, unspecified: Secondary | ICD-10-CM | POA: Diagnosis not present

## 2024-01-28 DIAGNOSIS — C4492 Squamous cell carcinoma of skin, unspecified: Secondary | ICD-10-CM

## 2024-01-28 DIAGNOSIS — R220 Localized swelling, mass and lump, head: Secondary | ICD-10-CM | POA: Diagnosis not present

## 2024-01-28 DIAGNOSIS — R229 Localized swelling, mass and lump, unspecified: Secondary | ICD-10-CM

## 2024-01-28 DIAGNOSIS — T1490XD Injury, unspecified, subsequent encounter: Secondary | ICD-10-CM

## 2024-01-28 DIAGNOSIS — Z85828 Personal history of other malignant neoplasm of skin: Secondary | ICD-10-CM

## 2024-01-28 NOTE — Patient Instructions (Addendum)

## 2024-01-28 NOTE — Progress Notes (Unsigned)
   Follow Up Visit   Subjective  Juan Wood is a 68 y.o. 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 The patient has been bandaging the wound as directed. The endorse the following concerns: none  The following portions of the chart were reviewed this encounter and updated as appropriate: medications, allergies, medical history  Review of Systems:  No other skin or systemic complaints except as noted in HPI or Assessment and Plan.  Objective  Well appearing patient in no apparent distress; mood and affect are within normal limits.  A focal examination was performed including scalp, head, face. All findings within normal limits unless otherwise noted below.  Healing wound with mild erythema  Relevant physical exam findings are noted in the Assessment and Plan.    Assessment & Plan   Healing Wound s/p Mohs for SCC on the left cheek, treated on 12/17/23, repaired with advancement flap - Reassured that wound is healing well - No evidence of infection - No swelling, induration, purulence, dehiscence, or tenderness out of proportion to the clinical exam, see photo above - Discussed that scars take up to 12 months to mature from the date of surgery - Recommend SPF 30+ to scar daily to prevent purple color from UV exposure during scar maturation process - Discussed that erythema and raised appearance of scar will fade over the next 4-6 months - OK to start scar massage at 4-6 weeks post-op - Can consider silicone based products for scar healing starting at 6 weeks post-op - Ok to continue ointment daily to wound under a bandage for another week  HISTORY OF SQUAMOUS CELL CARCINOMA OF THE SKIN - No evidence of recurrence today - Recommend regular full body skin exams - Recommend daily broad spectrum sunscreen SPF 30+ to  sun-exposed areas, reapply every 2 hours as needed.  - Call if any new or changing lesions are noted between office visits  Tender Deep Nodule- Left lower jaw - Patient states that it has been tender for several years - Recommend follow up with PCP for imaging  Return if symptoms worsen or fail to improve.  I, Darice Smock, CMA, am acting as scribe for RUFUS CHRISTELLA HOLY, MD.   Documentation: I have reviewed the above documentation for accuracy and completeness, and I agree with the above.  RUFUS CHRISTELLA HOLY, MD

## 2024-02-05 ENCOUNTER — Telehealth: Payer: Self-pay

## 2024-02-05 NOTE — Telephone Encounter (Signed)
 Patient called about imaging that was discussed at his last appt. He did go to the dentist and patient states they did some grinding on his dentures which has caused more pain.   Left Submental Lymphadenopathy  Exam: subQ mobile nodule   Treatment Plan: Recommend making an appointment with dentist, patient advises he will reach out to them today for an appointment. If dentist doesn't find cause of LAD, patient will contact us  and we will order an ultrasound for further imaging.   Please advise.

## 2024-02-06 ENCOUNTER — Other Ambulatory Visit: Payer: Self-pay | Admitting: Dermatology

## 2024-02-06 DIAGNOSIS — R591 Generalized enlarged lymph nodes: Secondary | ICD-10-CM

## 2024-02-10 NOTE — Telephone Encounter (Signed)
 Patient advised of information per Dr. Claudene and phone number to contact scheduling for the imaging dept. aw

## 2024-02-13 ENCOUNTER — Ambulatory Visit: Admission: RE | Admit: 2024-02-13 | Discharge: 2024-02-13 | Attending: Dermatology | Admitting: Dermatology

## 2024-02-13 DIAGNOSIS — R591 Generalized enlarged lymph nodes: Secondary | ICD-10-CM | POA: Diagnosis present

## 2024-02-17 ENCOUNTER — Ambulatory Visit: Payer: Self-pay | Admitting: Dermatology

## 2024-02-17 DIAGNOSIS — R591 Generalized enlarged lymph nodes: Secondary | ICD-10-CM

## 2024-02-17 NOTE — Telephone Encounter (Signed)
 Spoke with patient and advised of results. Patient stating its painful and hurts so bad he can't chew on that side, has been in pain for about six months to a year and wondering what to do.  Please advice

## 2024-02-17 NOTE — Addendum Note (Signed)
 Addended by: Stirling Orton on: 02/17/2024 05:32 PM   Modules accepted: Orders

## 2024-02-17 NOTE — Telephone Encounter (Signed)
 Called patient and advised of ENT referral. He would like to proceed with referral. Sent to [facility]NT.

## 2024-02-17 NOTE — Telephone Encounter (Signed)
-----   Message from [place] sent at 02/17/2024  1:23 PM EST ----- IMPRESSION: The palpable abnormality appears to correspond with a 7 mm dystrophic calcification.   A lymph node is also present in the region but is normal in size and morphology.  Plan: mychart   ----- Message ----- From: Interface, Rad Results In Sent: 02/14/2024  11:14 AM EST To: Boneta Sharps, MD

## 2024-03-20 ENCOUNTER — Other Ambulatory Visit: Payer: Self-pay

## 2024-03-20 ENCOUNTER — Emergency Department
Admission: EM | Admit: 2024-03-20 | Discharge: 2024-03-20 | Disposition: A | Discharge status: Home / Self Care | Attending: Emergency Medicine | Admitting: Emergency Medicine

## 2024-03-20 DIAGNOSIS — R6884 Jaw pain: Secondary | ICD-10-CM | POA: Insufficient documentation

## 2024-03-20 DIAGNOSIS — Z85828 Personal history of other malignant neoplasm of skin: Secondary | ICD-10-CM | POA: Diagnosis not present

## 2024-03-20 MED ORDER — OXYCODONE-ACETAMINOPHEN 5-325 MG PO TABS: 1.0000 | ORAL_TABLET | ORAL | 0 refills | Status: AC | PRN

## 2024-03-20 MED ORDER — OXYCODONE-ACETAMINOPHEN 5-325 MG PO TABS
1.0000 | ORAL_TABLET | Freq: Once | ORAL | Status: AC
  Administered 2024-03-20: 1 via ORAL
  Filled 2024-03-20: qty 1

## 2024-03-20 MED ORDER — IBUPROFEN 600 MG PO TABS
600.0000 mg | ORAL_TABLET | Freq: Once | ORAL | Status: AC
  Administered 2024-03-20: 600 mg via ORAL
  Filled 2024-03-20: qty 1

## 2024-03-20 NOTE — Discharge Instructions (Signed)
 You were seen in the emergency department for left jaw pain.  You can apply ice and heat.  You are given a prescription for a short course of pain medication.  Continue on your gabapentin .  Alternate ibuprofen  and Tylenol .  Follow-up with ear nose and throat specialist, you were given their telephone number today, call and make an appointment.  Follow-up with your primary care physician and your dermatologist.  Return for any signs of infection or worsening symptoms.  Pain control:  Ibuprofen  (motrin /aleve /advil ) - You can take 3 tablets (600 mg) every 6 hours as needed for pain/fever.  Acetaminophen  (tylenol ) - You can take 2 extra strength tablets (1000 mg) every 6 hours as needed for pain/fever. **If you are taking 1 Percocet every 6 hours only take 650 mg of Tylenol  every 6 hours** You can alternate these medications or take them together.  Make sure you eat food/drink water when taking these medications.  You were given a prescription for narcotic pain medications.  Take only if in severe pain.  These are very addictive medications.  These medications can make you constipated.  If you need to take more than 1-2 doses, start a stool softner.  If you become constipated, take 1 capfull of MiraLAX, can repeat untill having regular bowel movements.  Keep this medication out of reach of any children. You cannot drive/work while taking this medication.

## 2024-03-20 NOTE — ED Triage Notes (Signed)
 Pt to ED for L jaw pain since 1 month ago after having skin cancer removed to L face. Had u/s of jaw 10 days ago that was normal.

## 2024-03-20 NOTE — ED Provider Notes (Signed)
 "  [facility] Provider Note    Event Date/Time   First MD Initiated Contact with Patient 03/20/24 [phone]     (approximate)   History   Jaw Pain   HPI  Juan Wood is a 69 y.o. 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  Patient states that he has had ongoing pain in his left jaw over the past 2 months.  States that it hurts to chew or eat anything on the left side.  Patient had follow-up with dermatology and an ultrasound recently that was reassuring.  States that he also saw his dentist and was checked with his dentures and did not have any dental issues or problems with his dentures.  States that he slept his dentures out for multiple days with no significant improvement of the pain.  Taking ibuprofen  and gabapentin  for pain control.  Denies any new weakness or numbness.  States that he called his primary care physician but unable to get an appointment until the 18th.  States that he was given a referral to ear nose and throat specialist but has not heard back from anyone.  Denies fever, chills, redness, swelling.  No chest pain or shortness of breath.     Physical Exam   Triage Vital Signs: ED Triage Vitals [03/20/24 0816]  Encounter Vitals Group     BP (!) 157/85     Girls Systolic BP Percentile      Girls Diastolic BP Percentile      Boys Systolic BP Percentile      Boys Diastolic BP Percentile      Pulse Rate 77     Resp 16     Temp 98.4 F (36.9 C)     Temp Source Oral     SpO2 96 %     Weight 128 lb (58.1 kg)     Height 5' 10 (1.778 m)     Head Circumference      Peak Flow      Pain Score 8     Pain Loc      Pain Education      Exclude from Growth Chart     Most recent vital signs: Vitals:   03/20/24 0816  BP: (!) 157/85  Pulse: 77  Resp: 16  Temp: 98.4 F (36.9 C)  SpO2: 96%    Physical  Exam Constitutional:      Appearance: He is well-developed.  HENT:     Head: Atraumatic.     Comments: Skin flap that is well-healing to the left side of the face.  Dentures were removed with no significant underlying palpable fluctuance, no erythema or warmth.  No induration.  No crepitance.  Tenderness to palpation to the left mandible. Eyes:     Conjunctiva/sclera: Conjunctivae normal.  Cardiovascular:     Rate and Rhythm: Regular rhythm.  Pulmonary:     Effort: No respiratory distress.  Musculoskeletal:     Cervical back: Normal range of motion.  Skin:    General: Skin is warm.  Neurological:     Mental Status: He is alert. Mental status is at baseline.      IMPRESSION / MDM / ASSESSMENT AND PLAN / ED COURSE  I reviewed the triage vital signs and the nursing notes.  Differential diagnosis including nerve injury from prior surgery, recurrence of cancer, trigeminal neuralgia   Labs (all  labs ordered are listed, but only abnormal results are displayed) Labs interpreted as -    Labs Reviewed - No data to display  Patient with no findings consistent with an infectious process, no overlying erythema warmth or induration.  No fluctuance and no signs of an abscess.  No fever or chills and do not feel that CT scan with contrast is necessary at this time.  Most likely with pain secondary to his known surgery and squamous cell cancer.  Patient has been followed by dermatology.  Referral was placed for ENT but has not yet been evaluated by ENT.  Also has a primary care doctor.  He is also been evaluated by dentistry.  Given his ongoing symptoms we will do a short course of pain medication.  Given information to follow-up with ear nose and throat.  Discussed multimodal pain medication.  I have a low suspicion for giant cell arteritis.  Discussed return for any signs of infectious process or worsening symptoms.  No questions or concerns at time of discharge.     PROCEDURES:  Critical  Care performed: No  Procedures  Patient's presentation is most consistent with acute complicated illness / injury requiring diagnostic workup.   MEDICATIONS ORDERED IN ED: Medications  oxyCODONE -acetaminophen  (PERCOCET/ROXICET) 5-325 MG per tablet 1 tablet (1 tablet Oral Given 03/20/24 0944)  ibuprofen  (ADVIL ) tablet 600 mg (600 mg Oral Given 03/20/24 0944)    FINAL CLINICAL IMPRESSION(S) / ED DIAGNOSES   Final diagnoses:  Jaw pain     Rx / DC Orders   ED Discharge Orders          Ordered    oxyCODONE -acetaminophen  (PERCOCET) 5-325 MG tablet  Every 4 hours PRN        03/20/24 0926             Note:  This document was prepared using Dragon voice recognition software and may include unintentional dictation errors.   Suzanne Kirsch, MD 03/20/24 1044  "

## 2024-06-30 ENCOUNTER — Ambulatory Visit: Admitting: Dermatology
